# Patient Record
Sex: Female | Born: 1940 | Race: Black or African American | Hispanic: No | Marital: Single | Smoking: Former smoker
Health system: Southern US, Community
[De-identification: ages and names within clinical notes are randomized; demographics above are authoritative.]

## PROBLEM LIST (undated history)

## (undated) DIAGNOSIS — I209 Angina pectoris, unspecified: Secondary | ICD-10-CM

## (undated) DIAGNOSIS — Z923 Personal history of irradiation: Secondary | ICD-10-CM

## (undated) DIAGNOSIS — M2540 Effusion, unspecified joint: Secondary | ICD-10-CM

## (undated) DIAGNOSIS — E785 Hyperlipidemia, unspecified: Secondary | ICD-10-CM

## (undated) DIAGNOSIS — M1990 Unspecified osteoarthritis, unspecified site: Secondary | ICD-10-CM

## (undated) DIAGNOSIS — I10 Essential (primary) hypertension: Secondary | ICD-10-CM

## (undated) DIAGNOSIS — G4700 Insomnia, unspecified: Secondary | ICD-10-CM

## (undated) DIAGNOSIS — Z9889 Other specified postprocedural states: Secondary | ICD-10-CM

## (undated) DIAGNOSIS — J189 Pneumonia, unspecified organism: Secondary | ICD-10-CM

## (undated) DIAGNOSIS — C50919 Malignant neoplasm of unspecified site of unspecified female breast: Secondary | ICD-10-CM

## (undated) DIAGNOSIS — H269 Unspecified cataract: Secondary | ICD-10-CM

## (undated) DIAGNOSIS — R350 Frequency of micturition: Secondary | ICD-10-CM

## (undated) DIAGNOSIS — R112 Nausea with vomiting, unspecified: Secondary | ICD-10-CM

## (undated) DIAGNOSIS — M2550 Pain in unspecified joint: Secondary | ICD-10-CM

## (undated) HISTORY — PX: COLONOSCOPY: SHX174

## (undated) HISTORY — DX: Malignant neoplasm of unspecified site of unspecified female breast: C50.919

## (undated) HISTORY — PX: OTHER SURGICAL HISTORY: SHX169

## (undated) HISTORY — PX: CHOLECYSTECTOMY: SHX55

## (undated) HISTORY — PX: MYOMECTOMY: SHX85

## (undated) HISTORY — PX: TONSILLECTOMY: SUR1361

---

## 1978-01-10 HISTORY — PX: ABDOMINAL HYSTERECTOMY: SHX81

## 1996-01-11 HISTORY — PX: OTHER SURGICAL HISTORY: SHX169

## 1997-07-29 ENCOUNTER — Ambulatory Visit (HOSPITAL_COMMUNITY): Admission: RE | Admit: 1997-07-29 | Discharge: 1997-07-29 | Payer: Self-pay | Admitting: Internal Medicine

## 1997-10-07 ENCOUNTER — Ambulatory Visit (HOSPITAL_COMMUNITY): Admission: RE | Admit: 1997-10-07 | Discharge: 1997-10-07 | Payer: Self-pay | Admitting: Internal Medicine

## 1998-01-20 ENCOUNTER — Ambulatory Visit (HOSPITAL_COMMUNITY): Admission: RE | Admit: 1998-01-20 | Discharge: 1998-01-20 | Payer: Self-pay | Admitting: Internal Medicine

## 1998-08-04 ENCOUNTER — Ambulatory Visit (HOSPITAL_COMMUNITY): Admission: RE | Admit: 1998-08-04 | Discharge: 1998-08-04 | Payer: Self-pay | Admitting: Internal Medicine

## 1999-08-06 ENCOUNTER — Ambulatory Visit (HOSPITAL_COMMUNITY): Admission: RE | Admit: 1999-08-06 | Discharge: 1999-08-06 | Payer: Self-pay | Admitting: Internal Medicine

## 1999-11-12 ENCOUNTER — Ambulatory Visit (HOSPITAL_COMMUNITY): Admission: RE | Admit: 1999-11-12 | Discharge: 1999-11-12 | Payer: Self-pay | Admitting: Orthopedic Surgery

## 2000-08-07 ENCOUNTER — Ambulatory Visit (HOSPITAL_COMMUNITY): Admission: RE | Admit: 2000-08-07 | Discharge: 2000-08-07 | Payer: Self-pay | Admitting: Internal Medicine

## 2001-08-01 ENCOUNTER — Ambulatory Visit (HOSPITAL_COMMUNITY): Admission: RE | Admit: 2001-08-01 | Discharge: 2001-08-01 | Payer: Self-pay | Admitting: Internal Medicine

## 2002-08-05 ENCOUNTER — Ambulatory Visit (HOSPITAL_COMMUNITY): Admission: RE | Admit: 2002-08-05 | Discharge: 2002-08-05 | Payer: Self-pay | Admitting: *Deleted

## 2002-08-05 ENCOUNTER — Encounter: Payer: Self-pay | Admitting: Internal Medicine

## 2003-08-06 ENCOUNTER — Ambulatory Visit (HOSPITAL_COMMUNITY): Admission: RE | Admit: 2003-08-06 | Discharge: 2003-08-06 | Payer: Self-pay | Admitting: Internal Medicine

## 2004-11-09 ENCOUNTER — Encounter: Admission: RE | Admit: 2004-11-09 | Discharge: 2004-11-09 | Payer: Self-pay | Admitting: Internal Medicine

## 2006-08-17 ENCOUNTER — Ambulatory Visit (HOSPITAL_COMMUNITY): Admission: RE | Admit: 2006-08-17 | Discharge: 2006-08-17 | Payer: Self-pay | Admitting: Internal Medicine

## 2007-07-11 ENCOUNTER — Other Ambulatory Visit: Admission: RE | Admit: 2007-07-11 | Discharge: 2007-07-11 | Payer: Self-pay | Admitting: Obstetrics & Gynecology

## 2007-09-19 ENCOUNTER — Ambulatory Visit (HOSPITAL_COMMUNITY): Admission: RE | Admit: 2007-09-19 | Discharge: 2007-09-19 | Payer: Self-pay | Admitting: Internal Medicine

## 2008-09-24 ENCOUNTER — Ambulatory Visit (HOSPITAL_COMMUNITY): Admission: RE | Admit: 2008-09-24 | Discharge: 2008-09-24 | Payer: Self-pay | Admitting: Internal Medicine

## 2009-09-25 ENCOUNTER — Ambulatory Visit (HOSPITAL_COMMUNITY): Admission: RE | Admit: 2009-09-25 | Discharge: 2009-09-25 | Payer: Self-pay | Admitting: Internal Medicine

## 2010-01-31 ENCOUNTER — Encounter: Payer: Self-pay | Admitting: Orthopedic Surgery

## 2010-09-15 ENCOUNTER — Other Ambulatory Visit: Payer: Self-pay | Admitting: Internal Medicine

## 2010-09-15 DIAGNOSIS — Z1231 Encounter for screening mammogram for malignant neoplasm of breast: Secondary | ICD-10-CM

## 2010-10-06 ENCOUNTER — Ambulatory Visit (HOSPITAL_COMMUNITY)
Admission: RE | Admit: 2010-10-06 | Discharge: 2010-10-06 | Disposition: A | Discharge status: Home / Self Care | Payer: Medicare Other | Source: Ambulatory Visit | Attending: Internal Medicine | Admitting: Internal Medicine

## 2010-10-06 DIAGNOSIS — Z1231 Encounter for screening mammogram for malignant neoplasm of breast: Secondary | ICD-10-CM | POA: Insufficient documentation

## 2010-10-12 ENCOUNTER — Other Ambulatory Visit: Payer: Self-pay | Admitting: Internal Medicine

## 2010-10-12 DIAGNOSIS — R928 Other abnormal and inconclusive findings on diagnostic imaging of breast: Secondary | ICD-10-CM

## 2010-10-25 ENCOUNTER — Ambulatory Visit
Admission: RE | Admit: 2010-10-25 | Discharge: 2010-10-25 | Disposition: A | Discharge status: Home / Self Care | Payer: Medicare Other | Source: Ambulatory Visit | Attending: Internal Medicine | Admitting: Internal Medicine

## 2010-10-25 ENCOUNTER — Ambulatory Visit
Admission: RE | Admit: 2010-10-25 | Discharge: 2010-10-25 | Disposition: A | Discharge status: Home / Self Care | Payer: PRIVATE HEALTH INSURANCE | Source: Ambulatory Visit | Attending: Internal Medicine | Admitting: Internal Medicine

## 2010-10-25 ENCOUNTER — Other Ambulatory Visit: Payer: Self-pay | Admitting: Internal Medicine

## 2010-10-25 DIAGNOSIS — R928 Other abnormal and inconclusive findings on diagnostic imaging of breast: Secondary | ICD-10-CM

## 2010-11-09 ENCOUNTER — Emergency Department (HOSPITAL_COMMUNITY)
Admission: EM | Admit: 2010-11-09 | Discharge: 2010-11-09 | Disposition: A | Discharge status: Home / Self Care | Payer: Medicare Other | Attending: Emergency Medicine | Admitting: Emergency Medicine

## 2010-11-09 ENCOUNTER — Emergency Department (HOSPITAL_COMMUNITY): Payer: Medicare Other

## 2010-11-09 DIAGNOSIS — R079 Chest pain, unspecified: Secondary | ICD-10-CM | POA: Insufficient documentation

## 2010-11-09 DIAGNOSIS — I10 Essential (primary) hypertension: Secondary | ICD-10-CM | POA: Insufficient documentation

## 2010-11-09 LAB — CBC
HCT: 37.9 % (ref 36.0–46.0)
Hemoglobin: 12.9 g/dL (ref 12.0–15.0)
MCH: 32 pg (ref 26.0–34.0)
MCV: 94 fL (ref 78.0–100.0)
RBC: 4.03 MIL/uL (ref 3.87–5.11)
WBC: 3.8 10*3/uL — ABNORMAL LOW (ref 4.0–10.5)

## 2010-11-09 LAB — COMPREHENSIVE METABOLIC PANEL
ALT: 9 U/L (ref 0–35)
Alkaline Phosphatase: 72 U/L (ref 39–117)
CO2: 26 mEq/L (ref 19–32)
Chloride: 100 mEq/L (ref 96–112)
GFR calc Af Amer: >90 mL/min (ref >90)
GFR calc non Af Amer: >90 mL/min (ref >90)
Glucose, Bld: 83 mg/dL (ref 70–99)
Potassium: 4.6 mEq/L (ref 3.5–5.1)
Sodium: 137 mEq/L (ref 135–145)
Total Bilirubin: 1 mg/dL (ref 0.3–1.2)
Total Protein: 7.6 g/dL (ref 6.0–8.3)

## 2010-11-19 ENCOUNTER — Other Ambulatory Visit (HOSPITAL_COMMUNITY): Payer: Self-pay | Admitting: Cardiology

## 2010-12-01 ENCOUNTER — Ambulatory Visit (HOSPITAL_COMMUNITY): Admission: RE | Admit: 2010-12-01 | Payer: BC Managed Care – PPO | Source: Ambulatory Visit

## 2010-12-01 ENCOUNTER — Encounter (HOSPITAL_COMMUNITY)
Admission: RE | Admit: 2010-12-01 | Discharge: 2010-12-01 | Disposition: A | Discharge status: Home / Self Care | Payer: Medicare Other | Source: Ambulatory Visit | Attending: Cardiology | Admitting: Cardiology

## 2010-12-01 DIAGNOSIS — R079 Chest pain, unspecified: Secondary | ICD-10-CM | POA: Insufficient documentation

## 2010-12-01 MED ORDER — TECHNETIUM TC 99M TETROFOSMIN IV KIT
30.0000 | PACK | Freq: Once | INTRAVENOUS | Status: AC | PRN
  Administered 2010-12-01: 30 via INTRAVENOUS

## 2010-12-01 MED ORDER — REGADENOSON 0.4 MG/5ML IV SOLN
INTRAVENOUS | Status: AC
  Administered 2010-12-01: 0.4 mg
  Filled 2010-12-01: qty 5

## 2010-12-01 MED ORDER — SINCALIDE 5 MCG IJ SOLR: 0.0200 ug/kg | Freq: Once | INTRAMUSCULAR | Status: DC

## 2010-12-01 MED ORDER — TECHNETIUM TC 99M TETROFOSMIN IV KIT
10.0000 | PACK | Freq: Once | INTRAVENOUS | Status: AC | PRN
  Administered 2010-12-01: 10 via INTRAVENOUS

## 2010-12-16 ENCOUNTER — Inpatient Hospital Stay (HOSPITAL_BASED_OUTPATIENT_CLINIC_OR_DEPARTMENT_OTHER)
Admission: RE | Admit: 2010-12-16 | Discharge: 2010-12-16 | Disposition: A | Discharge status: Home / Self Care | Payer: Medicare Other | Source: Ambulatory Visit | Attending: Cardiology | Admitting: Cardiology

## 2010-12-16 ENCOUNTER — Encounter (HOSPITAL_BASED_OUTPATIENT_CLINIC_OR_DEPARTMENT_OTHER)
Admission: RE | Disposition: A | Discharge status: Home / Self Care | Payer: Self-pay | Source: Ambulatory Visit | Attending: Cardiology

## 2010-12-16 ENCOUNTER — Encounter (HOSPITAL_BASED_OUTPATIENT_CLINIC_OR_DEPARTMENT_OTHER): Payer: Self-pay | Admitting: *Deleted

## 2010-12-16 DIAGNOSIS — I2510 Atherosclerotic heart disease of native coronary artery without angina pectoris: Secondary | ICD-10-CM | POA: Insufficient documentation

## 2010-12-16 DIAGNOSIS — I10 Essential (primary) hypertension: Secondary | ICD-10-CM | POA: Insufficient documentation

## 2010-12-16 DIAGNOSIS — R079 Chest pain, unspecified: Secondary | ICD-10-CM | POA: Insufficient documentation

## 2010-12-16 HISTORY — DX: Essential (primary) hypertension: I10

## 2010-12-16 HISTORY — PX: CARDIAC CATHETERIZATION: SHX172

## 2010-12-16 HISTORY — DX: Angina pectoris, unspecified: I20.9

## 2010-12-16 SURGERY — JV LEFT HEART CATHETERIZATION WITH CORONARY ANGIOGRAM
Anesthesia: Moderate Sedation

## 2010-12-16 MED ORDER — DIAZEPAM 5 MG PO TABS
5.0000 mg | ORAL_TABLET | Freq: Once | ORAL | Status: AC
  Administered 2010-12-16: 5 mg via ORAL

## 2010-12-16 MED ORDER — SODIUM CHLORIDE 0.9 % IV SOLN: INTRAVENOUS | Status: DC

## 2010-12-16 MED ORDER — ONDANSETRON HCL 4 MG/2ML IJ SOLN: 4.0000 mg | Freq: Four times a day (QID) | INTRAMUSCULAR | Status: DC | PRN

## 2010-12-16 MED ORDER — ACETAMINOPHEN 325 MG PO TABS: 650.0000 mg | ORAL_TABLET | ORAL | Status: DC | PRN

## 2010-12-16 MED ORDER — DIAZEPAM 5 MG PO TABS: 5.0000 mg | ORAL_TABLET | Freq: Four times a day (QID) | ORAL | Status: DC | PRN

## 2010-12-16 MED ORDER — SODIUM CHLORIDE 0.9 % IV SOLN
INTRAVENOUS | Status: DC
  Administered 2010-12-16: 07:00:00 via INTRAVENOUS

## 2010-12-16 NOTE — Cardiovascular Report (Signed)
NAME:  Kara Diaz, Kara Diaz             ACCOUNT NO.:  [id number]  MEDICAL RECORD NO.:  [id number]  LOCATION:                                 FACILITY:  PHYSICIAN:  Lachina Salsberry N. Sharyn Lull, M.D. DATE OF BIRTH:  02-23-1940  DATE OF PROCEDURE:  12/16/2010 DATE OF DISCHARGE:                           CARDIAC CATHETERIZATION   PROCEDURE:  Left cardiac catheterization with selective left and right coronary angiography, left ventriculography via right groin using Judkins technique.  INDICATION FOR THE PROCEDURE:  Kara Diaz is a 70 year old black female with past medical history significant for hypertension, hypercholesteremia, morbid obesity, complains of left-sided chest pain described as burning, radiating to left shoulder and back, relieves with rest in few minutes.  The patient was seen in ER last month, workup was negative, and was referred to our office for further evaluation.  The patient denies any nausea, vomiting, diaphoresis.  Denies palpitation, lightheadedness, or syncope.  Denies PND, orthopnea, or leg swelling. The patient underwent Lexiscan Myoview on December 01, 2010, which showed mild reversible ischemia involving the apical portion of the anterior and anteroseptal wall with normal EF.  PAST MEDICAL HISTORY:  As above.  PAST SURGICAL HISTORY:  She had hysterectomy in the past, had cholecystectomy in the past, had right shoulder rotator cuff surgery in the past, had fibroid tumor resection in the past.  ALLERGIES:  No known drug allergies.  MEDICATIONS:  At home, she was on: 1. Amlodipine. 2. Benazepril 10/20 daily. 3. Maxzide 1 tablet daily. 4. K-Dur 20 mEq 1 tablet daily. 5. Enteric-coated aspirin 81 mg p.o. daily. 6. Nitrostat sublingual p.r.n.  SOCIAL HISTORY:  She is single.  No children.  Smoked 2 packs per day for 5-6 years, quit in 1990.  Drinks wine occasionally.  FAMILY HISTORY:  Father died of complications of heart failure at the age of 24.  Mother died of  sudden cardiac death at the age of 42.  One brother had coronary artery disease, and stroke, also had CABG.  PHYSICAL EXAMINATION:  GENERAL:  She was alert, awake, oriented x3 in no acute distress. VITAL SIGNS:  Blood pressure was 160/90, pulse was 72 and regular. EYES:  Conjunctivae was pink. NECK:  Supple.  No JVD.  No bruit. LUNGS:  Clear to auscultation without rhonchi or rales. CARDIOVASCULAR:  S1, S2 was normal.  There was soft systolic murmur. There was no S3 gallop. ABDOMEN:  Soft.  Bowel sounds were present.  Nontender. EXTREMITIES:  There was no clubbing, cyanosis, or edema.  IMPRESSION:  New-onset angina, positive Lexiscan Myoview rule out coronary insufficiency, hypertension, hypercholesteremia, morbid obesity, positive family history of coronary artery disease, and  remote tobacco abuse.  I discussed with the patient regarding mildly abnormal Lexiscan Myoview and various options of treatment, i.e. medical versus invasive left cath, its risks and benefits, i.e., death, myocardial infarction, stroke, local vascular complications, etc. and consented for left catheterization.  PROCEDURE:  After obtaining informed consent, the patient was brought to the cath lab and was placed on fluoroscopy table.  Right groin was prepped and draped in usual fashion.  Xylocaine 1% was used for local anesthesia in the right groin.  With the help of thin wall needle,  a 4- [place] arterial sheath was placed.  The sheath was aspirated and flushed.  Next, 4-French left Judkins catheter was advanced over the wire under fluoroscopic guidance up to the ascending aorta.  Wire was pulled out, the catheter was aspirated and connected to the Manifold. Catheter was further advanced and engaged into left coronary ostium. Multiple views of the left system were taken.  Next, the catheter was disengaged and was pulled out over the wire and was replaced with 4- [place] 3-D right diagnostic catheter, which was  advanced over the wire under fluoroscopic guidance up to the ascending aorta.  Wire was pulled out, the catheter was aspirated and connected to the Manifold.  Catheter was further advanced and engaged into right coronary ostium.  Multiple views of the right system were obtained.  Next, the catheter was disengaged and was pulled out over the wire and was replaced with 4- French pigtail catheter, which was advanced over the wire under fluoroscopic guidance to the ascending aorta.  Catheter was further advanced across the aortic valve into the left ventricle.  Left ventricular pressures were recorded.  Next, left ventriculography was done in 30-degree RAO position.  Post angiographic pressures were recorded from left ventricle and then pullback pressures were recorded from the aorta.  There was no gradient across the aortic valve.  Next, the pigtail catheter was pulled out over the wire.  Sheaths were aspirated and flushed.  FINDINGS:  LV showed good LV systolic function, EF of 60-65%.  Left main was patent.  LAD has 20-25% proximal stenosis.  Diagonal 1 is very, very small.  Diagonal 2 and 3 were small, which were patent.  Ramus was very small, which was patent.  Left circumflex has 30-40% ostial stenosis. OM 1 is small.  OM 2 and OM 3 were very, very small.  OM 4 was very small.  RCA was small, the patient has left dominant coronary system. The patient tolerated the procedure well.  There were no complications. The patient was transferred to recovery room in stable condition.     Eduardo Osier. Sharyn Lull, M.D.     MNH/MEDQ  D:  12/16/2010  T:  12/16/2010  Job:  [id number]

## 2010-12-16 NOTE — Op Note (Signed)
Cardiac cath report dictated on 12/16/2010 dictation number is [phone]

## 2010-12-16 NOTE — Progress Notes (Signed)
Bedrest begins @ 0845.  Tegaderm dressing applied to right groin, mobility limitiations discussed.

## 2010-12-16 NOTE — Progress Notes (Signed)
Discharge instructions completed, ambulated to bathroom without bleeding.  IV discontinued.  Discharged to home via wheelchair.  Patient voices understanding.

## 2011-01-11 DIAGNOSIS — Z923 Personal history of irradiation: Secondary | ICD-10-CM

## 2011-01-11 HISTORY — DX: Personal history of irradiation: Z92.3

## 2011-02-08 DIAGNOSIS — I2510 Atherosclerotic heart disease of native coronary artery without angina pectoris: Secondary | ICD-10-CM | POA: Diagnosis not present

## 2011-02-08 DIAGNOSIS — I10 Essential (primary) hypertension: Secondary | ICD-10-CM | POA: Diagnosis not present

## 2011-02-08 DIAGNOSIS — E669 Obesity, unspecified: Secondary | ICD-10-CM | POA: Diagnosis not present

## 2011-02-08 DIAGNOSIS — E7800 Pure hypercholesterolemia, unspecified: Secondary | ICD-10-CM | POA: Diagnosis not present

## 2011-03-11 ENCOUNTER — Other Ambulatory Visit: Payer: Self-pay | Admitting: Internal Medicine

## 2011-03-11 DIAGNOSIS — R921 Mammographic calcification found on diagnostic imaging of breast: Secondary | ICD-10-CM

## 2011-03-11 DIAGNOSIS — C50919 Malignant neoplasm of unspecified site of unspecified female breast: Secondary | ICD-10-CM

## 2011-03-11 HISTORY — DX: Malignant neoplasm of unspecified site of unspecified female breast: C50.919

## 2011-03-25 ENCOUNTER — Ambulatory Visit
Admission: RE | Admit: 2011-03-25 | Discharge: 2011-03-25 | Disposition: A | Discharge status: Home / Self Care | Payer: Medicare Other | Source: Ambulatory Visit | Attending: Internal Medicine | Admitting: Internal Medicine

## 2011-03-25 DIAGNOSIS — R921 Mammographic calcification found on diagnostic imaging of breast: Secondary | ICD-10-CM

## 2011-03-25 DIAGNOSIS — R928 Other abnormal and inconclusive findings on diagnostic imaging of breast: Secondary | ICD-10-CM | POA: Diagnosis not present

## 2011-03-25 DIAGNOSIS — D0590 Unspecified type of carcinoma in situ of unspecified breast: Secondary | ICD-10-CM | POA: Diagnosis not present

## 2011-03-25 DIAGNOSIS — R920 Mammographic microcalcification found on diagnostic imaging of breast: Secondary | ICD-10-CM | POA: Diagnosis not present

## 2011-03-25 HISTORY — PX: BREAST BIOPSY: SHX20

## 2011-03-28 ENCOUNTER — Other Ambulatory Visit: Payer: Self-pay | Admitting: Internal Medicine

## 2011-03-28 DIAGNOSIS — N6489 Other specified disorders of breast: Secondary | ICD-10-CM

## 2011-03-28 DIAGNOSIS — C50912 Malignant neoplasm of unspecified site of left female breast: Secondary | ICD-10-CM

## 2011-04-01 ENCOUNTER — Telehealth: Payer: Self-pay | Admitting: *Deleted

## 2011-04-01 ENCOUNTER — Ambulatory Visit
Admission: RE | Admit: 2011-04-01 | Discharge: 2011-04-01 | Disposition: A | Discharge status: Home / Self Care | Payer: Medicare Other | Source: Ambulatory Visit | Attending: Internal Medicine | Admitting: Internal Medicine

## 2011-04-01 ENCOUNTER — Encounter: Payer: Self-pay | Admitting: *Deleted

## 2011-04-01 ENCOUNTER — Other Ambulatory Visit: Payer: Self-pay | Admitting: Diagnostic Radiology

## 2011-04-01 ENCOUNTER — Other Ambulatory Visit: Payer: Self-pay | Admitting: *Deleted

## 2011-04-01 ENCOUNTER — Other Ambulatory Visit: Payer: Self-pay | Admitting: Internal Medicine

## 2011-04-01 DIAGNOSIS — I2510 Atherosclerotic heart disease of native coronary artery without angina pectoris: Secondary | ICD-10-CM | POA: Diagnosis not present

## 2011-04-01 DIAGNOSIS — C50919 Malignant neoplasm of unspecified site of unspecified female breast: Secondary | ICD-10-CM | POA: Diagnosis not present

## 2011-04-01 DIAGNOSIS — D0512 Intraductal carcinoma in situ of left breast: Secondary | ICD-10-CM | POA: Insufficient documentation

## 2011-04-01 DIAGNOSIS — C50912 Malignant neoplasm of unspecified site of left female breast: Secondary | ICD-10-CM

## 2011-04-01 DIAGNOSIS — N6489 Other specified disorders of breast: Secondary | ICD-10-CM

## 2011-04-01 DIAGNOSIS — I10 Essential (primary) hypertension: Secondary | ICD-10-CM | POA: Diagnosis not present

## 2011-04-01 DIAGNOSIS — E7800 Pure hypercholesterolemia, unspecified: Secondary | ICD-10-CM | POA: Diagnosis not present

## 2011-04-01 DIAGNOSIS — D0510 Intraductal carcinoma in situ of unspecified breast: Secondary | ICD-10-CM

## 2011-04-01 NOTE — Telephone Encounter (Signed)
Confirmed [facility] for 04/06/11 at 0800 .  Instructions and contact information given.  

## 2011-04-01 NOTE — Telephone Encounter (Signed)
per nurse victoria requested the patient be put on flush schedule at 9:00am to get an IV started before the patient goes to the GI appointment on 04-06-2011

## 2011-04-01 NOTE — Progress Notes (Signed)
rec request from [facility] for an IV access for pt prior to imaging study. Per Myriam Jacobson, pt is to arrive to "flush room" at 830 for IV access. Pt has been left a VM notifying date and time. Scheduling has also been notified

## 2011-04-01 NOTE — Telephone Encounter (Signed)
Left vm for pt to return call concerning [facility] appointment for 04/06/11.

## 2011-04-04 ENCOUNTER — Ambulatory Visit (HOSPITAL_BASED_OUTPATIENT_CLINIC_OR_DEPARTMENT_OTHER): Payer: Medicare Other | Admitting: Lab

## 2011-04-04 ENCOUNTER — Ambulatory Visit
Admission: RE | Admit: 2011-04-04 | Discharge: 2011-04-04 | Disposition: A | Discharge status: Home / Self Care | Payer: Medicare Other | Source: Ambulatory Visit | Attending: Internal Medicine | Admitting: Internal Medicine

## 2011-04-04 DIAGNOSIS — D0510 Intraductal carcinoma in situ of unspecified breast: Secondary | ICD-10-CM

## 2011-04-04 DIAGNOSIS — D0590 Unspecified type of carcinoma in situ of unspecified breast: Secondary | ICD-10-CM | POA: Diagnosis not present

## 2011-04-04 DIAGNOSIS — C50912 Malignant neoplasm of unspecified site of left female breast: Secondary | ICD-10-CM

## 2011-04-04 DIAGNOSIS — C50919 Malignant neoplasm of unspecified site of unspecified female breast: Secondary | ICD-10-CM | POA: Diagnosis not present

## 2011-04-04 LAB — COMPREHENSIVE METABOLIC PANEL
AST: 17 U/L (ref 0–37)
Albumin: 3.8 g/dL (ref 3.5–5.2)
BUN: 17 mg/dL (ref 6–23)
CO2: 36 mEq/L — ABNORMAL HIGH (ref 19–32)
Calcium: 9.9 mg/dL (ref 8.4–10.5)
Chloride: 100 mEq/L (ref 96–112)
Creatinine, Ser: 0.51 mg/dL (ref 0.50–1.10)
Glucose, Bld: 86 mg/dL (ref 70–99)
Potassium: 3.3 mEq/L — ABNORMAL LOW (ref 3.5–5.3)

## 2011-04-04 LAB — CBC WITH DIFFERENTIAL/PLATELET
Basophils Absolute: 0 10*3/uL (ref 0.0–0.1)
EOS%: 1.7 % (ref 0.0–7.0)
Eosinophils Absolute: 0.1 10*3/uL (ref 0.0–0.5)
HCT: 38.8 % (ref 34.8–46.6)
HGB: 13.2 g/dL (ref 11.6–15.9)
MCH: 32.5 pg (ref 25.1–34.0)
NEUT#: 2 10*3/uL (ref 1.5–6.5)
NEUT%: 51.9 % (ref 38.4–76.8)
lymph#: 1.5 10*3/uL (ref 0.9–3.3)

## 2011-04-04 MED ORDER — GADOBENATE DIMEGLUMINE 529 MG/ML IV SOLN: 18.0000 mL | Freq: Once | INTRAVENOUS | Status: AC | PRN

## 2011-04-06 ENCOUNTER — Ambulatory Visit
Admission: RE | Admit: 2011-04-06 | Discharge: 2011-04-06 | Disposition: A | Discharge status: Home / Self Care | Payer: Medicare Other | Source: Ambulatory Visit | Attending: Radiation Oncology | Admitting: Radiation Oncology

## 2011-04-06 ENCOUNTER — Ambulatory Visit: Payer: Medicare Other

## 2011-04-06 ENCOUNTER — Ambulatory Visit (HOSPITAL_BASED_OUTPATIENT_CLINIC_OR_DEPARTMENT_OTHER): Payer: Medicare Other | Admitting: Oncology

## 2011-04-06 ENCOUNTER — Ambulatory Visit: Payer: Medicare Other | Attending: Surgery | Admitting: Physical Therapy

## 2011-04-06 ENCOUNTER — Encounter: Payer: Self-pay | Admitting: Oncology

## 2011-04-06 ENCOUNTER — Telehealth: Payer: Self-pay | Admitting: *Deleted

## 2011-04-06 ENCOUNTER — Other Ambulatory Visit (INDEPENDENT_AMBULATORY_CARE_PROVIDER_SITE_OTHER): Payer: Self-pay | Admitting: Surgery

## 2011-04-06 ENCOUNTER — Ambulatory Visit (HOSPITAL_BASED_OUTPATIENT_CLINIC_OR_DEPARTMENT_OTHER): Payer: Medicare Other | Admitting: Surgery

## 2011-04-06 ENCOUNTER — Other Ambulatory Visit: Payer: Medicare Other | Admitting: Lab

## 2011-04-06 ENCOUNTER — Encounter (INDEPENDENT_AMBULATORY_CARE_PROVIDER_SITE_OTHER): Payer: Self-pay | Admitting: Surgery

## 2011-04-06 VITALS — BP 169/93 | HR 73 | Temp 98.0°F | Ht 64.0 in | Wt 202.8 lb

## 2011-04-06 DIAGNOSIS — D0590 Unspecified type of carcinoma in situ of unspecified breast: Secondary | ICD-10-CM

## 2011-04-06 DIAGNOSIS — I10 Essential (primary) hypertension: Secondary | ICD-10-CM

## 2011-04-06 DIAGNOSIS — D0510 Intraductal carcinoma in situ of unspecified breast: Secondary | ICD-10-CM

## 2011-04-06 DIAGNOSIS — M1990 Unspecified osteoarthritis, unspecified site: Secondary | ICD-10-CM

## 2011-04-06 DIAGNOSIS — IMO0001 Reserved for inherently not codable concepts without codable children: Secondary | ICD-10-CM | POA: Insufficient documentation

## 2011-04-06 DIAGNOSIS — C50919 Malignant neoplasm of unspecified site of unspecified female breast: Secondary | ICD-10-CM | POA: Diagnosis not present

## 2011-04-06 DIAGNOSIS — M25569 Pain in unspecified knee: Secondary | ICD-10-CM | POA: Diagnosis not present

## 2011-04-06 DIAGNOSIS — R293 Abnormal posture: Secondary | ICD-10-CM | POA: Insufficient documentation

## 2011-04-06 DIAGNOSIS — M25519 Pain in unspecified shoulder: Secondary | ICD-10-CM | POA: Insufficient documentation

## 2011-04-06 NOTE — Patient Instructions (Signed)
1. Surgery  2. Candidate for B-43 trial  3. Radiation after surgery  4. Anti-estrogen   5. Follow up in 1 month

## 2011-04-06 NOTE — Progress Notes (Signed)
Kara Diaz [id number] Dec 09, 1940 71 y.o. 04/06/2011 12:02 PM  CC  August Saucer, ERIC, MD, MD [address] [facility] [place] [address] Dr. Harriette Bouillon Dr. Lurline Hare  REASON FOR CONSULTATION:  New diagnosis of high-grade ductal carcinoma in situ of the left breast, clinical stage 0 being seen for discussion of treatment options.  STAGE:   DCIS (ductal carcinoma in situ) of breast   Primary site: Breast (Left)   Staging method: AJCC 7th Edition   Clinical: Stage 0 (Tis, N0, cM0)   Summary: Stage 0 (Tis, N0, cM0)  REFERRING PHYSICIAN: Dr. Maisie Fus Cornett  HISTORY OF PRESENT ILLNESS:  Kara Diaz is a 71 y.o. female With medical history significant for hypertension angina and some osteoarthritis. Patient began having screening mammograms at a young age around 67. Most recently she presented back in September for a screening mammogram and she was found to have 2.5 cm calcifications in the left breast. She thereafter most recently in [place] she presented for a biopsy of these calcifications. She had a image guided biopsy performed that showed an intermediate grade to high grade ductal carcinoma in situ. Tumor was question receptor 8% progesterone receptor 7%. She had an MRI of the breasts performed and the MRI showed a 1.2 cm area of hematoma. There were no findings in the contralateral breast. She is now seen in the multidisciplinary breast clinic for discussion of her treatment options. She is without any complaints except for her arthritis.   Past Medical History: Past Medical History  Diagnosis Date  . Hypertension   . Angina     Past Surgical History: Past Surgical History  Procedure Date  . Cholecystectomy   . Abdominal hysterectomy   . Rotator cuff surgery     Family History: History reviewed. No pertinent family history.  Social History History  Substance Use Topics  . Smoking status: Never Smoker   . Smokeless tobacco:  Never Used  . Alcohol Use: Yes    Allergies: No Known Allergies  Current Medications: Current Outpatient Prescriptions  Medication Sig Dispense Refill  . amLODipine-benazepril (LOTREL) 10-20 MG per capsule Take 1 capsule by mouth daily.      . nitroGLYCERIN (NITROSTAT) 0.4 MG SL tablet Place 0.4 mg under the tongue every 5 (five) minutes as needed. Take as directed      . potassium chloride (K-DUR) 10 MEQ tablet Take 20 mEq by mouth 2 (two) times daily.      Marland Kitchen triamterene-hydrochlorothiazide (DYAZIDE) 37.5-25 MG per capsule Take 1 capsule by mouth every morning.        OB/GYN History:Menarche at age 36 underwent menopause in 76 she has never been on hormone replacement therapy she is nulliparous. She has had her colonoscopy. She began her first screening mammogram in 1993 approximately.  Fertility Discussion:Not applicable Prior History of Cancer:No prior history of cancer  Health Maintenance: Colonoscopy has been performed there is no documentation of the bone density or Pap smear   ECOG PERFORMANCE STATUS: 1 - Symptomatic but completely ambulatory  Genetic Counseling/testing:Full family history was reviewed with the patient there is no family history of ovarian cancers or early-onset breast cancers or bilateral breast cancers.  REVIEW OF SYSTEMS:  Constitutional: negative Ears, nose, mouth, throat, and face: negative Respiratory: negative Cardiovascular: positive for palpitations Gastrointestinal: negative Genitourinary:negative Integument/breast: positive for breast tenderness Musculoskeletal:positive for stiff joints Neurological: negative  PHYSICAL EXAMINATION: Blood pressure 169/93, pulse 73, temperature 98 F (36.7 C), height 5\' 4"  (1.626 m),  weight 202 lb 12.8 oz (91.989 kg).  ZOX:WRUEA, healthy and no distress SKIN: skin color, texture, turgor are normal HEAD: No masses, lesions, tenderness or abnormalities EYES: PERRLA, EOMI, Conjunctiva are pink and  non-injected EARS: External ears normal OROPHARYNX:no exudate, no erythema and lips, buccal mucosa, and tongue normal  NECK: supple, no adenopathy, thyroid normal size, non-tender, without nodularity LYMPH:  no palpable lymphadenopathy BREAST:Left breast reveals area of ecchymosis at the biopsy site. No other masses are noted right breast no masses or nipple discharge or changes. LUNGS: clear to auscultation and percussion HEART: regular rate & rhythm ABDOMEN:abdomen soft, non-tender, obese, normal bowel sounds and no masses or organomegaly BACK: No CVA tenderness EXTREMITIES:no joint deformities, effusion, or inflammation  NEURO: alert & oriented x 3 with fluent speech, no focal motor/sensory deficits, gait normal, reflexes normal and symmetric   STUDIES/RESULTS: Mr Breast Bilateral W Wo Contrast  04/04/2011  *RADIOLOGY REPORT*  Clinical Data: New diagnosis left sided breast cancer.  BILATERAL BREAST MRI WITH AND WITHOUT CONTRAST  Technique: Multiplanar, multisequence MR images of both breasts were obtained prior to and following the intravenous administration of 18ml of Multihance.  Three dimensional images were evaluated at the independent DynaCad workstation.  Comparison:  Mammogram dated 04/01/2011  Findings: Postbiopsy changes are seen in the upper-outer quadrant of the left breast, middle third with a 1.2 cm hematoma. Peripheral enhancement seen at the biopsy cavity without additional clumped and linear enhancement.  A biopsy clip is in place.  No other suspicious mass or enhancement seen in either breast.  No axillary or internal mammary adenopathy is seen.  Benign cysts or hemangiomas are seen in the liver.  IMPRESSION: Postbiopsy changes at the site of known malignancy, left breast without significant residual enhancement.  No MRI specific evidence of malignancy, right breast.  THREE-DIMENSIONAL MR IMAGE RENDERING ON INDEPENDENT WORKSTATION:  Three-dimensional MR images were rendered by  post-processing of the original MR data on an independent workstation.  The three- dimensional MR images were interpreted, and findings were reported in the accompanying complete MRI report for this study.  BI-RADS CATEGORY 6:  Known biopsy-proven malignancy - appropriate action should be taken.  Original Report Authenticated By: Hiram Gash, M.D.   Mm Breast Stereo Biopsy Left  04/05/2011  **ADDENDUM** CREATED: 04/05/2011 13:54:42  The area of abnormal calcification in the left breast is felt to measure approximately 1.4 x 2.0 x 2.5 cm.  Some of calcifications included in the original measurement have been stable for several years and are felt to be benign in etiology.  Addended by:  Cain Saupe, M.D. on 04/05/2011 13:54:42.  **END ADDENDUM** SIGNED BY: Cain Saupe, M.D.    03/28/2011  **ADDENDUM** CREATED: 03/28/2011 13:29:54  Biopsy results revealed DCIS with calcifications and necrosis. This is concordant with imaging findings.  This is discussed with the patient by telephone at 12:05 p.m. on 03/28/2010. She states the site is healing well.  A breast MRI is scheduled for 04/01/2011 at 8:30 a.m.  She will be seen at [facility] on 04/06/2011.  She is instructed to call the Breast Center with further questions or concerns.  Addended by:  Hiram Gash, M.D. on 03/28/2011 13:29:54.  **END ADDENDUM** SIGNED BY: Hiram Gash, M.D.    03/25/2011  *RADIOLOGY REPORT*  Clinical Data:  Patient presents for stereotactic needle biopsy of a group of indeterminate microcalcifications of the upper outer quadrant of the left breast.  STEREOTACTIC-GUIDED VACUUM ASSISTED BIOPSY OF THE LEFT BREAST AND  SPECIMEN RADIOGRAPH  I met with the patient and we discussed the procedure of stereotactic-guided biopsy, including benefits and alternatives. We discussed the high likelihood of a successful procedure. We discussed the risks of the procedure, including infection, bleeding, tissue injury,  clip migration, and inadequate sampling. Informed, written consent was given.  Using sterile technique, 2% lidocaine, stereotactic guidance, and a 9 gauge vacuum assisted device, biopsy was performed of the targeted microcalcifications of the upper outer left breast. Specimen radiograph was performed, showing multiple of the targeted microcalcifications.  Specimens with calcifications are identified for pathology.  At the conclusion of the procedure, a tissue marker clip was deployed into the biopsy cavity.  Follow-up 2-view mammogram confirmed T-shaped clip placement along the anterior margin of the biopsy site.  IMPRESSION: Stereotactic-guided biopsy of indeterminate microcalcifications upper outer quadrant left breast.  No apparent complications.  Original Report Authenticated By: Daryl Eastern, M.D.   Mm Breast Surgical Specimen  04/05/2011  **ADDENDUM** CREATED: 04/05/2011 13:54:42  The area of abnormal calcification in the left breast is felt to measure approximately 1.4 x 2.0 x 2.5 cm.  Some of calcifications included in the original measurement have been stable for several years and are felt to be benign in etiology.  Addended by:  Cain Saupe, M.D. on 04/05/2011 13:54:42.  **END ADDENDUM** SIGNED BY: Cain Saupe, M.D.    03/28/2011  **ADDENDUM** CREATED: 03/28/2011 13:29:54  Biopsy results revealed DCIS with calcifications and necrosis. This is concordant with imaging findings.  This is discussed with the patient by telephone at 12:05 p.m. on 03/28/2010. She states the site is healing well.  A breast MRI is scheduled for 04/01/2011 at 8:30 a.m.  She will be seen at [facility] on 04/06/2011.  She is instructed to call the Breast Center with further questions or concerns.  Addended by:  Hiram Gash, M.D. on 03/28/2011 13:29:54.  **END ADDENDUM** SIGNED BY: Hiram Gash, M.D.    03/25/2011  *RADIOLOGY REPORT*  Clinical Data:  Patient presents for stereotactic needle  biopsy of a group of indeterminate microcalcifications of the upper outer quadrant of the left breast.  STEREOTACTIC-GUIDED VACUUM ASSISTED BIOPSY OF THE LEFT BREAST AND SPECIMEN RADIOGRAPH  I met with the patient and we discussed the procedure of stereotactic-guided biopsy, including benefits and alternatives. We discussed the high likelihood of a successful procedure. We discussed the risks of the procedure, including infection, bleeding, tissue injury, clip migration, and inadequate sampling. Informed, written consent was given.  Using sterile technique, 2% lidocaine, stereotactic guidance, and a 9 gauge vacuum assisted device, biopsy was performed of the targeted microcalcifications of the upper outer left breast. Specimen radiograph was performed, showing multiple of the targeted microcalcifications.  Specimens with calcifications are identified for pathology.  At the conclusion of the procedure, a tissue marker clip was deployed into the biopsy cavity.  Follow-up 2-view mammogram confirmed T-shaped clip placement along the anterior margin of the biopsy site.  IMPRESSION: Stereotactic-guided biopsy of indeterminate microcalcifications upper outer quadrant left breast.  No apparent complications.  Original Report Authenticated By: Daryl Eastern, M.D.   Mm Digital Diagnostic Unilat R  04/01/2011  *RADIOLOGY REPORT*  Clinical Data:  New diagnosis, left sided breast cancer. Evaluation before MRI.  DIGITAL DIAGNOSTIC RIGHT MAMMOGRAM WITH CAD  Comparison:  Multiple priors  Findings:  There is a fibrofatty pattern.  No mass or malignant type calcifications are seen.  Compared to priors. Mammographic images were processed with CAD.  IMPRESSION: No mammographic evidence  of malignancy, right breast.  The patient is scheduled for a breast MRI on Monday, March 25.  BI-RADS CATEGORY 1:  Negative.  Original Report Authenticated By: Hiram Gash, M.D.     LABS:    Chemistry      Component Value Date/Time    NA 140 04/04/2011 0933   K 3.3* 04/04/2011 0933   CL 100 04/04/2011 0933   CO2 36* 04/04/2011 0933   BUN 17 04/04/2011 0933   CREATININE 0.51 04/04/2011 0933      Component Value Date/Time   CALCIUM 9.9 04/04/2011 0933   ALKPHOS 72 04/04/2011 0933   AST 17 04/04/2011 0933   ALT 10 04/04/2011 0933   BILITOT 0.9 04/04/2011 0933      Lab Results  Component Value Date   WBC 3.8* 04/04/2011   HGB 13.2 04/04/2011   HCT 38.8 04/04/2011   MCV 95.2 04/04/2011   PLT 289 04/04/2011       PATHOLOGY: ADDITIONAL INFORMATION: PROGNOSTIC INDICATORS - ACIS Results IMMUNOHISTOCHEMICAL AND MORPHOMETRIC ANALYSIS BY THE AUTOMATED CELLULAR IMAGING SYSTEM (ACIS) Estrogen Receptor (Negative, <1%): 8%, WEAK STAINING INTENSITY Progesterone Receptor (Negative, <1%): 7%, WEAK STAINING INTENSITY All controls stained appropriately Pecola Leisure MD Pathologist, Electronic Signature ( Signed 03/30/2011) FINAL DIAGNOSIS Diagnosis Breast, left, needle core biopsy, UOQ - DUCTAL CARCINOMA IN SITU WITH ASSOCIATED CALCIFICATION. - SEE COMMENT. Microscopic Comment Sections from the left upper outer quadrant needle core biopsy show ductal carcinoma in situ with associated calcification. The ductal carcinoma in situ is intermediate nuclear grade with comedo-type necrosis which is most consistent with a high grade ductal carcinoma in situ. ER and PR markers will be performed on the specimen and reported in an addendum. Dr. Frederica Kuster has seen this case in consultation with agreement. The findings are called to [facility] on 03/28/2011. (RAH:eps 03/28/11)  ASSESSMENT: 71 year old female with:    #1 ductal carcinoma in situ with associated calcifications of the left upper-outer quadrant patient is status post needle core biopsy performed on 03/25/2011. This was a high-grade DCIS, ER 8% weakly staining PR 7% weakly staining. Patient was seen in the multidisciplinary breast clinic today. She was seen by Dr.  Lurline Hare Dr. Harriette Bouillon and myself. The maximum diameter of the tumor and calcifications was about 2.5 cm by mammogram. Post biopsy MRI showed only a 1.2 cm area of enhancement with hematoma.  #2 hypertension and heart disease  #3 mild osteoarthritis some knee joint involvement.  Clinical Trial Eligibility:  NSABP B-43 Trial enrollment was discussed with the patient and she has agreed to enroll in this clinical trial. She will meet with the research nurses after she has her definitive surgery.  Multidisciplinary conference discussion Patient's case was discussed at the multidisciplinary breast clinic And a lumpectomy followed by radiation therapy and antiestrogen therapy was discussed with the possibility of patient enrolling on a clinical trial.    PLAN:    #1 patient and I met today to discuss adjuvant antiestrogen therapy after her lumpectomy. We also discussed clinical trial utilizing Herceptin in DCIS following the enrollment of NSABP B. 43 study. Asks and benefits of antiestrogen therapy were discussed with her as a preventive. Also discussed the clinical trial in detail.  #2 patient will be having her lumpectomy with sentinel node biopsy first by Dr. Luisa Hart. Hopefully this can be done within the next one month. And I will plan on seeing her back thereafter. At her next visit with me we will discuss the clinical  study and I will have her meet with my nurses.     Discussion: Patient is being treated per NCCN breast cancer care guidelines appropriate for stage 0 (DCIS)   Thank you so much for allowing me to participate in the care of [facility]. I will continue to follow up the patient with you and assist in her care.  All questions were answered. The patient knows to call the clinic with any problems, questions or concerns. We can certainly see the patient much sooner if necessary.  I spent 60 minutes counseling the patient face to face. The total time spent in the  appointment was 60 minutes.  Drue Second, MD Medical/Oncology [facility] [phone] (beeper) [phone] (Office)  04/06/2011, 12:02 PM 04/06/2011, 12:02 PM

## 2011-04-06 NOTE — Telephone Encounter (Signed)
gave patient appointment for 05-10-2011 starting at 10:00am printed out calendar and gave to the patient

## 2011-04-06 NOTE — Progress Notes (Signed)
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  HISTORY OF PRESENT ILLNESS::Kara Diaz is a 71 y.o. female who went to a screening mammogram in September. The calcifications were noted. A biopsy was recommended. She presented for a followup earlier this month. The calcifications had slightly increased in size. Her located in the upper outer quadrants. Measured approximately 2.5 x 1.4 x 2 cm. 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 She had a MRI which showed a 1.2 cm hematoma. She presents for our opinion regarding radiation and the management of her disease today. She had no issues with her biopsy. She had no pain.  PREVIOUS RADIATION THERAPY: No  PAST MEDICAL HISTORY:  has a past medical history of Hypertension and Angina.    PAST SURGICAL HISTORY: Past Surgical History  Procedure Date  . Cholecystectomy   . Abdominal hysterectomy   . Rotator cuff surgery     FAMILY HISTORY:  She has 3 cousins with breast cancer.    SOCIAL HISTORY:  reports that she has never smoked. She has never used smokeless tobacco. She reports that she drinks alcohol. She reports that she does not use illicit drugs.  ALLERGIES: Review of patient's allergies indicates no known allergies.  MEDICATIONS:  Current Outpatient Prescriptions  Medication Sig Dispense Refill  . amLODipine-benazepril (LOTREL) 10-20 MG per capsule Take 1 capsule by mouth daily.      . nitroGLYCERIN (NITROSTAT) 0.4 MG SL tablet Place 0.4 mg under the tongue every 5 (five) minutes as needed. Take as directed      . potassium chloride (K-DUR) 10 MEQ tablet Take 20 mEq by  mouth 2 (two) times daily.      Marland Kitchen triamterene-hydrochlorothiazide (DYAZIDE) 37.5-25 MG per capsule Take 1 capsule by mouth every morning.        REVIEW OF SYSTEMS:  A 15 point review of systems is documented in the electronic medical record. This was obtained by the nursing staff. However, I reviewed this with the patient to discuss relevant findings and make appropriate changes.  A comprehensive review of systems was negative.   PHYSICAL EXAM:  vitals were not taken for this visit.  There were no vitals taken for this visit.  General Appearance:    Alert, cooperative, no distress, appears stated age  Head:    Normocephalic, without obvious abnormality, atraumatic  Eyes:    PERRL, conjunctiva/corneas clear, EOM's intact, fundi    benign, both eyes  Ears:    Normal TM's and external ear canals, both ears  Nose:   Nares normal, septum midline, mucosa normal, no drainage    or sinus tenderness  Throat:   Lips, mucosa, and tongue normal; teeth and gums normal  Neck:   Supple, symmetrical, trachea midline, no adenopathy;    thyroid:  no enlargement/tenderness/nodules; no carotid   bruit or JVD  Back:     Symmetric, no curvature, ROM normal, no CVA tenderness  Lungs:     Clear to auscultation bilaterally, respirations unlabored  Chest Wall:    No tenderness or deformity   Heart:    Regular rate and rhythm, S1 and S2 normal, no murmur, rub  or gallop  Breast Exam:    She has a small biopsy site in the left breast. No tenderness, masses, or nipple abnormality  Abdomen:     Soft, non-tender, bowel sounds active all four quadrants,    no masses, no organomegaly  Genitalia:    Normal female without lesion, discharge or tenderness  Rectal:    Normal tone, normal prostate, no masses or tenderness;   guaiac negative stool  Extremities:   Extremities normal, atraumatic, no cyanosis or edema  Pulses:   2+ and symmetric all extremities  Skin:   Skin color, texture, turgor normal, no rashes or lesions    Lymph nodes:   Cervical, supraclavicular, and axillary nodes normal  Neurologic:   CNII-XII intact, normal strength, sensation and reflexes    throughout    LABORATORY DATA:  Lab Results  Component Value Date   WBC 3.8* 04/04/2011   HGB 13.2 04/04/2011   HCT 38.8 04/04/2011   MCV 95.2 04/04/2011   PLT 289 04/04/2011   Lab Results  Component Value Date   NA 140 04/04/2011   K 3.3* 04/04/2011   CL 100 04/04/2011   CO2 36* 04/04/2011   Lab Results  Component Value Date   ALT 10 04/04/2011   AST 17 04/04/2011   ALKPHOS 72 04/04/2011   BILITOT 0.9 04/04/2011     RADIOGRAPHY: Mr Breast Bilateral W Wo Contrast  04/04/2011  *RADIOLOGY REPORT*  Clinical Data: New diagnosis left sided breast cancer.  BILATERAL BREAST MRI WITH AND WITHOUT CONTRAST  Technique: Multiplanar, multisequence MR images of both breasts were obtained prior to and following the intravenous administration of 18ml of Multihance.  Three dimensional images were evaluated at the independent DynaCad workstation.  Comparison:  Mammogram dated 04/01/2011  Findings: Postbiopsy changes are seen in the upper-outer quadrant of the left breast, middle third with a 1.2 cm hematoma. Peripheral enhancement seen at the biopsy cavity without additional clumped and linear enhancement.  A biopsy clip is in place.  No other suspicious mass or enhancement seen in either breast.  No axillary or internal mammary adenopathy is seen.  Benign cysts or hemangiomas are seen in the liver.  IMPRESSION: Postbiopsy changes at the site of known malignancy, left breast without significant residual enhancement.  No MRI specific evidence of malignancy, right breast.  THREE-DIMENSIONAL MR IMAGE RENDERING ON INDEPENDENT WORKSTATION:  Three-dimensional MR images were rendered by post-processing of the original MR data on an independent workstation.  The three- dimensional MR images were interpreted, and findings were reported in the accompanying complete MRI report for this  study.  BI-RADS CATEGORY 6:  Known biopsy-proven malignancy - appropriate action should be taken.  Original Report Authenticated By: Hiram Gash, M.D.   Mm Breast Stereo Biopsy Left  04/05/2011  **ADDENDUM** CREATED: 04/05/2011 13:54:42  The area of abnormal calcification in the left breast is felt to measure approximately 1.4 x 2.0 x 2.5 cm.  Some of calcifications included in the original measurement have been stable for several years and are felt to be benign in etiology.  Addended by:  Cain Saupe, M.D. on 04/05/2011 13:54:42.  **END ADDENDUM** SIGNED BY: Cain Saupe, M.D.    03/28/2011  **ADDENDUM** CREATED: 03/28/2011 13:29:54  Biopsy results revealed DCIS with calcifications and necrosis. This is concordant with imaging findings.  This is discussed with the patient by telephone at 12:05 p.m. on 03/28/2010. She states the site is healing well.  A breast MRI is scheduled for 04/01/2011 at 8:30  a.m.  [facility] will [facility]2011.  She is instructed to call the [facility] with further questions or concerns.  Addended by:  Hiram Gash, M.D. on 03/28/2011 13:29:54.  **END ADDENDUM** SIGNED BY: Hiram Gash, M.D.    03/25/2011  *RADIOLOGY REPORT*  Clinical Data:  Patient presents for stereotactic needle biopsy of a group of indeterminate microcalcifications of the upper outer quadrant of the left breast.  STEREOTACTIC-GUIDED VACUUM ASSISTED BIOPSY OF THE LEFT BREAST AND SPECIMEN RADIOGRAPH  I met with the patient and we discussed the procedure of stereotactic-guided biopsy, including benefits and alternatives. We discussed the high likelihood of a successful procedure. We discussed the risks of the procedure, including infection, bleeding, tissue injury, clip migration, and inadequate sampling. Informed, written consent was given.  Using sterile technique, 2% lidocaine, stereotactic guidance, and a 9 gauge vacuum assisted device, biopsy was performed of  the targeted microcalcifications of the upper outer left breast. Specimen radiograph was performed, showing multiple of the targeted microcalcifications.  Specimens with calcifications are identified for pathology.  At the conclusion of the procedure, a tissue marker clip was deployed into the biopsy cavity.  Follow-up 2-view mammogram confirmed T-shaped clip placement along the anterior margin of the biopsy site.  IMPRESSION: Stereotactic-guided biopsy of indeterminate microcalcifications upper outer quadrant left breast.  No apparent complications.  Original Report Authenticated By: Daryl Eastern, M.D.   Mm Breast Surgical Specimen  04/05/2011  **ADDENDUM** CREATED: 04/05/2011 13:54:42  The area of abnormal calcification in the left breast is felt to measure approximately 1.4 x 2.0 x 2.5 cm.  Some of calcifications included in the original measurement have been stable for several years and are felt to be benign in etiology.  Addended by:  Cain Saupe, M.D. on 04/05/2011 13:54:42.  **END ADDENDUM** SIGNED BY: Cain Saupe, M.D.    03/28/2011  **ADDENDUM** CREATED: 03/28/2011 13:29:54  Biopsy results revealed DCIS with calcifications and necrosis. This is concordant with imaging findings.  This is discussed with the patient by telephone at 12:05 p.m. on 03/28/2010. She states the site is healing well.  A breast MRI is scheduled for 04/01/2011 at 8:30 a.m.  She will be seen [facility] on 04/06/2011.  She is instructed to call the Breast Center with further questions or concerns.  Addended by:  Hiram Gash, M.D. on 03/28/2011 13:29:54.  **END ADDENDUM** SIGNED BY: Hiram Gash, M.D.    03/25/2011  *RADIOLOGY REPORT*  Clinical Data:  Patient presents for stereotactic needle biopsy of a group of indeterminate microcalcifications of the upper outer quadrant of the left breast.  STEREOTACTIC-GUIDED VACUUM ASSISTED BIOPSY OF THE LEFT BREAST AND SPECIMEN RADIOGRAPH  I met with  the patient and we discussed the procedure of stereotactic-guided biopsy, including benefits and alternatives. We discussed the high likelihood of a successful procedure. We discussed the risks of the procedure, including infection, bleeding, tissue injury, clip migration, and inadequate sampling. Informed, written consent was given.  Using sterile technique, 2% lidocaine, stereotactic guidance, and a 9 gauge vacuum assisted device, biopsy was performed of the targeted microcalcifications of the upper outer left breast. Specimen radiograph was performed, showing multiple of the targeted microcalcifications.  Specimens with calcifications are identified for pathology.  At the conclusion of the procedure, a tissue marker clip was deployed into the biopsy cavity.  Follow-up 2-view mammogram confirmed T-shaped clip placement along the anterior margin of the biopsy site.  IMPRESSION: Stereotactic-guided biopsy of indeterminate microcalcifications  upper outer quadrant left breast.  No apparent complications.  Original Report Authenticated By: Daryl Eastern, M.D.   Mm Digital Diagnostic Unilat R  04/01/2011  *RADIOLOGY REPORT*  Clinical Data:  New diagnosis, left sided breast cancer. Evaluation before MRI.  DIGITAL DIAGNOSTIC RIGHT MAMMOGRAM WITH CAD  Comparison:  Multiple priors  Findings:  There is a fibrofatty pattern.  No mass or malignant type calcifications are seen.  Compared to priors. Mammographic images were processed with CAD.  IMPRESSION: No mammographic evidence of malignancy, right breast.  The patient is scheduled for a breast MRI on Monday, March 25.  BI-RADS CATEGORY 1:  Negative.  Original Report Authenticated By: Hiram Gash, M.D.      IMPRESSION: 71 year old female with newly diagnosed DCIS  PLAN: Kara Diaz met with myself Dr. Luisa Hart Dr. Park Breed today for discussion of management of her disease. We discussed breast conservation. We discussed the role of radiation and decreasing local  failure and patient to undergo breast conservation. He discussed 6 weeks of treatment. We discussed the process of simulation and placement of tattoos. We discussed the possible acute and chronic side effects of treatment including but not limited to the skin redness irritation darkness and fatigue. We discussed the possibility of heart and lung damage in the technology used to avoid those effects. We discussed possible prone breast treatment due to her pendulous breast size. We discussed the possibility of enrollment on the 43. She will discuss this in further detail with Dr. Welton Flakes. I will plan on seeing her back after her surgery. 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 Also able to meet with a member of our patient family support team as well as a survivor. I spent 60 minutes minutes face to face with the patient and more than 50% of that time was spent in counseling and/or coordination of care.   -----------------------------------------------

## 2011-04-06 NOTE — Patient Instructions (Signed)
Lumpectomy, Breast Conserving Surgery A lumpectomy is breast surgery that removes only part of the breast. Another name used may be partial mastectomy. The amount removed varies. Make sure you understand how much of your breast will be removed. Reasons for a lumpectomy:  Any solid breast mass.   Grouped significant nodularity that may be confused with a solitary breast mass.  Lumpectomy is the most common form of breast cancer surgery today. The surgeon removes the portion of your breast which contains the tumor (cancer). This is the lump. Some normal tissue around the lump is also removed to be sure that all the tumor has been removed.  If cancer cells are found in the margins where the breast tissue was removed, your surgeon will do more surgery to remove the remaining cancer tissue. This is called re-excision surgery. Radiation and/or chemotherapy treatments are often given following a lumpectomy to kill any cancer cells that could possibly remain.  REASONS YOU MAY NOT BE ABLE TO HAVE BREAST CONSERVING SURGERY:  The tumor is located in more than one place.   Your breast is small and the tumor is large so the breast would be disfigured.   The entire tumor removal is not successful with a lumpectomy.   You cannot commit to a full course of chemotherapy, radiation therapy or are pregnant and cannot have radiation.   You have previously had radiation to the breast to treat cancer.  HOW A LUMPECTOMY IS PERFORMED If overnight nursing is not required following a biopsy, a lumpectomy can be performed as a same-day surgery. This can be done in a hospital, clinic, or surgical center. The anesthesia used will depend on your surgeon. They will discuss this with you. A general anesthetic keeps you sleeping through the procedure. LET YOUR CAREGIVERS KNOW ABOUT THE FOLLOWING:  Allergies   Medications taken including herbs, eye drops, over the counter medications, and creams.   Use of steroids (by  mouth or creams)   Previous problems with anesthetics or Novocaine.   Possibility of pregnancy, if this applies   History of blood clots (thrombophlebitis)   History of bleeding or blood problems.   Previous surgery   Other health problems  BEFORE THE PROCEDURE You should be present one hour prior to your procedure unless directed otherwise.  AFTER THE PROCEDURE  After surgery, you will be taken to the recovery area where a nurse will watch and check your progress. Once you're awake, stable, and taking fluids well, barring other problems you will be allowed to go home.   Ice packs applied to your operative site may help with discomfort and keep the swelling down.   A small rubber drain may be placed in the breast for a couple of days to prevent a hematoma from developing in the breast.   A pressure dressing may be applied for 24 to 48 hours to prevent bleeding.   Keep the wound dry.   You may resume a normal diet and activities as directed. Avoid strenuous activities affecting the arm on the side of the biopsy site such as tennis, swimming, heavy lifting (more than 10 pounds) or pulling.   Bruising in the breast is normal following this procedure.   Wearing a bra - even to bed - may be more comfortable and also help keep the dressing on.   Change dressings as directed.   Only take over-the-counter or prescription medicines for pain, discomfort, or fever as directed by your caregiver.  Call for your results as   instructed by your surgeon. Remember it is your responsibility to get the results of your lumpectomy if your surgeon asked you to follow-up. Do not assume everything is fine if you have not heard from your caregiver. SEEK MEDICAL CARE IF:   There is increased bleeding (more than a small spot) from the wound.   You notice redness, swelling, or increasing pain in the wound.   Pus is coming from wound.   An unexplained oral temperature above 102 F (38.9 C) develops.     You notice a foul smell coming from the wound or dressing.  SEEK IMMEDIATE MEDICAL CARE IF:   You develop a rash.   You have difficulty breathing.   You have any allergic problems.  Document Released: 02/07/2006 Document Revised: 12/16/2010 Document Reviewed: 05/11/2006 [employer] Patient Information 2012 [employer]. Lumpectomy, Breast Conserving Surgery Care After Please read the instructions outlined below and refer to this sheet in the next few weeks. These discharge instructions provide you with general information on caring for yourself after you leave the hospital. Your surgeon may also give you specific instructions. While your treatment has been planned according to the most current medical practices available, unavoidable complications occasionally occur. If you have any problems or questions after discharge, please call your surgeon. Reasons for a lumpectomy:  Any solid breast mass.   Grouped significant nodularity that may be confused with a solitary breast mass.  AFTER THE PROCEDURE  After surgery, you will be taken to the recovery area where a nurse will watch and check your progress. Once you're awake, stable, and taking fluids well, barring other problems you will be allowed to go home.   Ice packs applied to your operative site may help with discomfort and keep the swelling down.   A small rubber drain may be placed in the incision for a couple of days to prevent a hematoma in the breast.   A pressure dressing may be applied for 24 to 48 hours to prevent bleeding.   Keep the wound dry.   You may resume a normal diet and activities as directed. Avoid strenuous activities affecting the arm on the side of the biopsy site such as tennis, swimming, heavy lifting (more than 10 pounds) or pulling.   Bruising in the breast is normal following this procedure.   Wearing a bra - even to bed - may be more comfortable and also help keep the dressing on.   Change  dressings as directed.   Only take over-the-counter or prescription medicines for pain, discomfort, or fever as directed by your caregiver.  Call for your results as instructed by your surgeon. Remember it isyour responsibility to get the results of your lumpectomy if your surgeon asked you to follow-up. Do not assume everything is fine if you have not heard from your caregiver. SEEK MEDICAL CARE IF:   There is increased bleeding (more than a small spot) from the wound.   You notice redness, swelling, or increasing pain in the wound.   Pus is coming from wound.   An unexplained oral temperature above 102 F (38.9 C) develops.   You notice a foul smell coming from the wound or dressing.  SEEK IMMEDIATE MEDICAL CARE IF:   You develop a rash.   You have difficulty breathing.   You have any allergic problems.  Document Released: 01/12/2006 Document Revised: 12/16/2010 Document Reviewed: 12/15/2006 [employer] Patient Information 2012 [employer]. 

## 2011-04-06 NOTE — Progress Notes (Signed)
Patient ID: Kara Diaz, female   DOB: 02-16-1940, 71 y.o.   MRN: [id number]  No chief complaint on file.   HPI Kara Diaz is a 71 y.o. female.  Pt presents at the request of Dr August Saucer due to abnormal mammogram and left breast microcalcifications biopsy proven DCIS  Measuring 2 cm in central upper breast.  She denies mass,  Pain or discharge. HPI  Past Medical History  Diagnosis Date  . Hypertension   . Angina     Past Surgical History  Procedure Date  . Cholecystectomy   . Abdominal hysterectomy   . Rotator cuff surgery     No family history on file.  Social History History  Substance Use Topics  . Smoking status: Never Smoker   . Smokeless tobacco: Never Used  . Alcohol Use: Yes    No Known Allergies  Current Outpatient Prescriptions  Medication Sig Dispense Refill  . amLODipine-benazepril (LOTREL) 10-20 MG per capsule Take 1 capsule by mouth daily.      . nitroGLYCERIN (NITROSTAT) 0.4 MG SL tablet Place 0.4 mg under the tongue every 5 (five) minutes as needed. Take as directed      . potassium chloride (K-DUR) 10 MEQ tablet Take 20 mEq by mouth 2 (two) times daily.      Marland Kitchen triamterene-hydrochlorothiazide (DYAZIDE) 37.5-25 MG per capsule Take 1 capsule by mouth every morning.        Review of Systems Review of Systems  Constitutional: Negative for fever, chills and unexpected weight change.  HENT: Negative for hearing loss, congestion, sore throat, trouble swallowing and voice change.   Eyes: Negative for visual disturbance.  Respiratory: Negative for cough and wheezing.   Cardiovascular: Negative for chest pain, palpitations and leg swelling.  Gastrointestinal: Negative for nausea, vomiting, abdominal pain, diarrhea, constipation, blood in stool, abdominal distention and anal bleeding.  Genitourinary: Negative for hematuria, vaginal bleeding and difficulty urinating.  Musculoskeletal: Negative for arthralgias.  Skin: Negative for rash and wound.    Neurological: Negative for seizures, syncope and headaches.  Hematological: Negative for adenopathy. Does not bruise/bleed easily.  Psychiatric/Behavioral: Negative for confusion.    T 98  HR 70  BP STABLE  Physical Exam Physical Exam  Constitutional: She appears well-developed and well-nourished.  HENT:  Head: Normocephalic and atraumatic.  Eyes: EOM are normal. Pupils are equal, round, and reactive to light.  Neck: Normal range of motion. Neck supple.  Cardiovascular: Normal rate and regular rhythm.   Pulmonary/Chest: Effort normal and breath sounds normal.       Large pedulous breasts bilateral.  Small hematoma left breast central upper portion.  Right breast normal.  Bilateral axilla normal  Abdominal: Soft. Bowel sounds are normal.  Musculoskeletal: Normal range of motion.  Neurological: She is alert.  Skin: Skin is warm and dry.  Psychiatric: She has a normal mood and affect. Her behavior is normal. Judgment and thought content normal.    Data Reviewed Mammogram and MRI show 2 cm cluster of micro calcifications central upper breast.   Assessment    Left breast DCIS    Plan    Pt desires left breast lumpectomy with needle localization.The procedure has been discussed with the patient. Alternatives to surgery have been discussed with the patient.  Risks of surgery include bleeding,  Infection,  Seroma formation, death,  and the need for further surgery.   The patient understands and wishes to proceed.       Kara Diaz A. 04/06/2011, 10:20 AM

## 2011-04-11 ENCOUNTER — Encounter: Payer: Self-pay | Admitting: *Deleted

## 2011-04-11 NOTE — Progress Notes (Signed)
Mailed after appt letter to pt. 

## 2011-04-14 ENCOUNTER — Telehealth: Payer: Self-pay | Admitting: *Deleted

## 2011-04-14 ENCOUNTER — Encounter: Payer: Self-pay | Admitting: *Deleted

## 2011-04-14 NOTE — Telephone Encounter (Signed)
per staff message from Joanna on 04-14-2011 changed patient appointment to 05-13-2011 starting at 1:30pm

## 2011-04-15 ENCOUNTER — Telehealth: Payer: Self-pay | Admitting: *Deleted

## 2011-04-15 NOTE — Telephone Encounter (Signed)
Left vm requesting pt to return call regarding [facility] from 3/27

## 2011-04-18 ENCOUNTER — Telehealth: Payer: Self-pay | Admitting: *Deleted

## 2011-04-18 NOTE — Telephone Encounter (Signed)
Spoke with pt concerning [facility] from 3/27.  Pt denies questions or concerns regarding dx or treatment care plan.  Confirmed surgery date and f/u appts with Drs. Welton Flakes and [place].  Encourage pt to call with needs.  Contact information given.

## 2011-04-22 ENCOUNTER — Encounter (HOSPITAL_COMMUNITY): Payer: Self-pay

## 2011-04-27 DIAGNOSIS — I10 Essential (primary) hypertension: Secondary | ICD-10-CM | POA: Diagnosis not present

## 2011-04-27 DIAGNOSIS — Z853 Personal history of malignant neoplasm of breast: Secondary | ICD-10-CM | POA: Diagnosis not present

## 2011-04-27 DIAGNOSIS — M899 Disorder of bone, unspecified: Secondary | ICD-10-CM | POA: Diagnosis not present

## 2011-04-27 DIAGNOSIS — M25569 Pain in unspecified knee: Secondary | ICD-10-CM | POA: Diagnosis not present

## 2011-05-02 ENCOUNTER — Telehealth (INDEPENDENT_AMBULATORY_CARE_PROVIDER_SITE_OTHER): Payer: Self-pay | Admitting: General Surgery

## 2011-05-02 ENCOUNTER — Encounter (HOSPITAL_COMMUNITY): Payer: Self-pay

## 2011-05-02 ENCOUNTER — Encounter (HOSPITAL_COMMUNITY)
Admission: RE | Admit: 2011-05-02 | Discharge: 2011-05-02 | Disposition: A | Discharge status: Home / Self Care | Payer: Medicare Other | Source: Ambulatory Visit | Attending: Surgery | Admitting: Surgery

## 2011-05-02 VITALS — BP 167/88 | HR 71 | Temp 97.1°F | Resp 20 | Ht 64.0 in | Wt 204.1 lb

## 2011-05-02 DIAGNOSIS — Z01818 Encounter for other preprocedural examination: Secondary | ICD-10-CM | POA: Diagnosis not present

## 2011-05-02 DIAGNOSIS — Z01812 Encounter for preprocedural laboratory examination: Secondary | ICD-10-CM | POA: Diagnosis not present

## 2011-05-02 DIAGNOSIS — D0510 Intraductal carcinoma in situ of unspecified breast: Secondary | ICD-10-CM

## 2011-05-02 DIAGNOSIS — Z01811 Encounter for preprocedural respiratory examination: Secondary | ICD-10-CM | POA: Diagnosis not present

## 2011-05-02 DIAGNOSIS — I10 Essential (primary) hypertension: Secondary | ICD-10-CM | POA: Diagnosis not present

## 2011-05-02 DIAGNOSIS — D0590 Unspecified type of carcinoma in situ of unspecified breast: Secondary | ICD-10-CM | POA: Diagnosis not present

## 2011-05-02 HISTORY — DX: Pneumonia, unspecified organism: J18.9

## 2011-05-02 HISTORY — DX: Pain in unspecified joint: M25.50

## 2011-05-02 HISTORY — DX: Other specified postprocedural states: R11.2

## 2011-05-02 HISTORY — DX: Unspecified osteoarthritis, unspecified site: M19.90

## 2011-05-02 HISTORY — DX: Other specified postprocedural states: Z98.890

## 2011-05-02 HISTORY — DX: Hyperlipidemia, unspecified: E78.5

## 2011-05-02 HISTORY — DX: Insomnia, unspecified: G47.00

## 2011-05-02 HISTORY — DX: Frequency of micturition: R35.0

## 2011-05-02 HISTORY — DX: Unspecified cataract: H26.9

## 2011-05-02 HISTORY — DX: Effusion, unspecified joint: M25.40

## 2011-05-02 LAB — CBC
Platelets: 328 10*3/uL (ref 150–400)
RBC: 4.22 MIL/uL (ref 3.87–5.11)
RDW: 12.5 % (ref 11.5–15.5)
WBC: 4.3 10*3/uL (ref 4.0–10.5)

## 2011-05-02 LAB — DIFFERENTIAL
Basophils Relative: 0 % (ref 0–1)
Eosinophils Relative: 2 % (ref 0–5)
Monocytes Absolute: 0.4 10*3/uL (ref 0.1–1.0)
Monocytes Relative: 8 % (ref 3–12)
Neutro Abs: 1.9 10*3/uL (ref 1.7–7.7)

## 2011-05-02 LAB — COMPREHENSIVE METABOLIC PANEL
ALT: 12 U/L (ref 0–35)
AST: 17 U/L (ref 0–37)
Albumin: 4 g/dL (ref 3.5–5.2)
CO2: 32 mEq/L (ref 19–32)
Chloride: 101 mEq/L (ref 96–112)
Creatinine, Ser: 0.55 mg/dL (ref 0.50–1.10)
Potassium: 3 mEq/L — ABNORMAL LOW (ref 3.5–5.1)
Sodium: 142 mEq/L (ref 135–145)
Total Bilirubin: 0.7 mg/dL (ref 0.3–1.2)

## 2011-05-02 MED ORDER — CHLORHEXIDINE GLUCONATE 4 % EX LIQD: 1.0000 "application " | Freq: Once | CUTANEOUS | Status: DC

## 2011-05-02 NOTE — Progress Notes (Addendum)
Dr.harwani is cardiologist;last visit in Mar 2013-to request report  Stress test done 12/01/10 report in epic Results of heart cath in epic from 12/16/10 Denies ever having an echo  Medical MD is Dr.Eric August Saucer in [place]  Pt has never had to use Ntg

## 2011-05-02 NOTE — Telephone Encounter (Signed)
Pt calling because has surgery scheduled next Monday.  She has lost a filling and going to [facility] to get it repaired.  Asking if this is okay and won't delay her surgery.  Pt advised to proceed with whatever dental work is needed.

## 2011-05-02 NOTE — Consult Note (Signed)
Anesthesiology chart review:  Ms. Kara Diaz work was reviewed. The potassium is 3.0 she was on Maxzide for hypertension. This is an acceptable value for surgery.  [name] M.D.

## 2011-05-02 NOTE — Pre-Procedure Instructions (Signed)
20 Aleanna Menge  05/02/2011   Your procedure is scheduled on:  Mon, April 29 @ 1000 AM  Report to [facility] at 0800 AM.  Call this number if you have problems the morning of surgery: [phone]   Remember:   Do not eat food:After Midnight.  May have clear liquids: up to 4 Hours before arrival.(until 4:00 am)  Clear liquids include soda, tea, black coffee, apple or grape juice, broth,water  Take these medicines the morning of surgery with A SIP OF WATER: Lotrel   Do not wear jewelry, make-up or nail polish.  Do not wear lotions, powders, or perfumes.   Do not shave 48 hours prior to surgery.  Do not bring valuables to the hospital.  Contacts, dentures or bridgework may not be worn into surgery.  Leave suitcase in the car. After surgery it may be brought to your room.  For patients admitted to the hospital, checkout time is 11:00 AM the day of discharge.   Patients discharged the day of surgery will not be allowed to drive home.  Special Instructions: CHG Shower Use Special Wash: 1/2 bottle night before surgery and 1/2 bottle morning of surgery.   Please read over the following fact sheets that you were given: Pain Booklet, Coughing and Deep Breathing, MRSA Information and Surgical Site Infection Prevention

## 2011-05-06 DIAGNOSIS — H2510 Age-related nuclear cataract, unspecified eye: Secondary | ICD-10-CM | POA: Diagnosis not present

## 2011-05-08 MED ORDER — CEFAZOLIN SODIUM-DEXTROSE 2-3 GM-% IV SOLR
2.0000 g | INTRAVENOUS | Status: AC
  Administered 2011-05-09: 2 g via INTRAVENOUS
  Filled 2011-05-08: qty 50

## 2011-05-09 ENCOUNTER — Ambulatory Visit (HOSPITAL_COMMUNITY)
Admission: RE | Admit: 2011-05-09 | Discharge: 2011-05-09 | Disposition: A | Discharge status: Home / Self Care | Payer: Medicare Other | Source: Ambulatory Visit | Attending: Surgery | Admitting: Surgery

## 2011-05-09 ENCOUNTER — Encounter (HOSPITAL_COMMUNITY)
Admission: RE | Disposition: A | Discharge status: Home / Self Care | Payer: Self-pay | Source: Ambulatory Visit | Attending: Surgery

## 2011-05-09 ENCOUNTER — Ambulatory Visit
Admission: RE | Admit: 2011-05-09 | Discharge: 2011-05-09 | Disposition: A | Discharge status: Home / Self Care | Payer: Medicare Other | Source: Ambulatory Visit | Attending: Surgery | Admitting: Surgery

## 2011-05-09 ENCOUNTER — Ambulatory Visit (HOSPITAL_COMMUNITY): Payer: Medicare Other | Admitting: Anesthesiology

## 2011-05-09 ENCOUNTER — Encounter (HOSPITAL_COMMUNITY): Payer: Self-pay | Admitting: Surgery

## 2011-05-09 ENCOUNTER — Encounter (HOSPITAL_COMMUNITY): Payer: Self-pay | Admitting: Anesthesiology

## 2011-05-09 DIAGNOSIS — D0510 Intraductal carcinoma in situ of unspecified breast: Secondary | ICD-10-CM

## 2011-05-09 DIAGNOSIS — D0590 Unspecified type of carcinoma in situ of unspecified breast: Secondary | ICD-10-CM | POA: Insufficient documentation

## 2011-05-09 DIAGNOSIS — C50919 Malignant neoplasm of unspecified site of unspecified female breast: Secondary | ICD-10-CM | POA: Diagnosis not present

## 2011-05-09 DIAGNOSIS — I10 Essential (primary) hypertension: Secondary | ICD-10-CM | POA: Insufficient documentation

## 2011-05-09 DIAGNOSIS — Z01818 Encounter for other preprocedural examination: Secondary | ICD-10-CM | POA: Insufficient documentation

## 2011-05-09 DIAGNOSIS — Z01812 Encounter for preprocedural laboratory examination: Secondary | ICD-10-CM | POA: Insufficient documentation

## 2011-05-09 HISTORY — PX: BREAST LUMPECTOMY: SHX2

## 2011-05-09 SURGERY — BREAST LUMPECTOMY WITH NEEDLE LOCALIZATION
Anesthesia: General | Site: Breast | Laterality: Left | Wound class: Clean

## 2011-05-09 MED ORDER — MIDAZOLAM HCL 2 MG/2ML IJ SOLN: 1.0000 mg | INTRAMUSCULAR | Status: DC | PRN

## 2011-05-09 MED ORDER — GLYCOPYRROLATE 0.2 MG/ML IJ SOLN
INTRAMUSCULAR | Status: DC | PRN
  Administered 2011-05-09: 0.2 mg via INTRAVENOUS

## 2011-05-09 MED ORDER — LORAZEPAM 2 MG/ML IJ SOLN: 1.0000 mg | Freq: Once | INTRAMUSCULAR | Status: DC | PRN

## 2011-05-09 MED ORDER — HYDROCODONE-ACETAMINOPHEN 10-325 MG PO TABS: 1.0000 | ORAL_TABLET | ORAL | Status: AC | PRN

## 2011-05-09 MED ORDER — FENTANYL CITRATE 0.05 MG/ML IJ SOLN
INTRAMUSCULAR | Status: DC | PRN
  Administered 2011-05-09: 25 ug via INTRAVENOUS
  Administered 2011-05-09: 50 ug via INTRAVENOUS
  Administered 2011-05-09 (×2): 25 ug via INTRAVENOUS

## 2011-05-09 MED ORDER — HYDROMORPHONE HCL PF 1 MG/ML IJ SOLN: 0.2500 mg | INTRAMUSCULAR | Status: DC | PRN

## 2011-05-09 MED ORDER — BUPIVACAINE-EPINEPHRINE 0.25% -1:200000 IJ SOLN
INTRAMUSCULAR | Status: DC | PRN
  Administered 2011-05-09: 30 mL

## 2011-05-09 MED ORDER — MIDAZOLAM HCL 5 MG/5ML IJ SOLN
INTRAMUSCULAR | Status: DC | PRN
  Administered 2011-05-09: 2 mg via INTRAVENOUS

## 2011-05-09 MED ORDER — FENTANYL CITRATE 0.05 MG/ML IJ SOLN: 50.0000 ug | INTRAMUSCULAR | Status: DC | PRN

## 2011-05-09 MED ORDER — LIDOCAINE HCL (CARDIAC) 20 MG/ML IV SOLN
INTRAVENOUS | Status: DC | PRN
  Administered 2011-05-09: 60 mg via INTRAVENOUS

## 2011-05-09 MED ORDER — LACTATED RINGERS IV SOLN
INTRAVENOUS | Status: DC
  Administered 2011-05-09: 10:00:00 via INTRAVENOUS

## 2011-05-09 MED ORDER — PROPOFOL 10 MG/ML IV EMUL
INTRAVENOUS | Status: DC | PRN
  Administered 2011-05-09: 40 mg via INTRAVENOUS
  Administered 2011-05-09: 160 mg via INTRAVENOUS

## 2011-05-09 MED ORDER — ONDANSETRON HCL 4 MG/2ML IJ SOLN
INTRAMUSCULAR | Status: DC | PRN
  Administered 2011-05-09: 4 mg via INTRAVENOUS

## 2011-05-09 SURGICAL SUPPLY — 48 items
APPLIER CLIP 9.375 MED OPEN (MISCELLANEOUS) ×2
BINDER BREAST LRG (GAUZE/BANDAGES/DRESSINGS) IMPLANT
BINDER BREAST XLRG (GAUZE/BANDAGES/DRESSINGS) IMPLANT
BLADE SURG 10 STRL SS (BLADE) ×2 IMPLANT
BLADE SURG 15 STRL LF DISP TIS (BLADE) ×1 IMPLANT
BLADE SURG 15 STRL SS (BLADE) ×1
CANISTER SUCTION 2500CC (MISCELLANEOUS) ×2 IMPLANT
CHLORAPREP W/TINT 26ML (MISCELLANEOUS) ×2 IMPLANT
CLIP APPLIE 9.375 MED OPEN (MISCELLANEOUS) ×1 IMPLANT
CLOTH BEACON ORANGE TIMEOUT ST (SAFETY) ×2 IMPLANT
COVER SURGICAL LIGHT HANDLE (MISCELLANEOUS) ×2 IMPLANT
DERMABOND ADVANCED (GAUZE/BANDAGES/DRESSINGS)
DERMABOND ADVANCED .7 DNX12 (GAUZE/BANDAGES/DRESSINGS) IMPLANT
DEVICE DUBIN SPECIMEN MAMMOGRA (MISCELLANEOUS) ×2 IMPLANT
DRAPE CHEST BREAST 15X10 FENES (DRAPES) ×2 IMPLANT
DRAPE UTILITY 15X26 W/TAPE STR (DRAPE) ×4 IMPLANT
ELECT CAUTERY BLADE 6.4 (BLADE) ×2 IMPLANT
ELECT REM PT RETURN 9FT ADLT (ELECTROSURGICAL) ×2
ELECTRODE REM PT RTRN 9FT ADLT (ELECTROSURGICAL) ×1 IMPLANT
GLOVE BIO SURGEON STRL SZ7.5 (GLOVE) ×2 IMPLANT
GLOVE BIO SURGEON STRL SZ8 (GLOVE) ×2 IMPLANT
GLOVE BIOGEL PI IND STRL 6.5 (GLOVE) ×1 IMPLANT
GLOVE BIOGEL PI IND STRL 8 (GLOVE) ×1 IMPLANT
GLOVE BIOGEL PI INDICATOR 6.5 (GLOVE) ×1
GLOVE BIOGEL PI INDICATOR 8 (GLOVE) ×1
GLOVE ECLIPSE 6.5 STRL STRAW (GLOVE) ×2 IMPLANT
GOWN STRL NON-REIN LRG LVL3 (GOWN DISPOSABLE) ×4 IMPLANT
KIT BASIN OR (CUSTOM PROCEDURE TRAY) ×2 IMPLANT
KIT MARKER MARGIN INK (KITS) ×2 IMPLANT
KIT ROOM TURNOVER OR (KITS) ×2 IMPLANT
NEEDLE 18GX1X1/2 (RX/OR ONLY) (NEEDLE) ×2 IMPLANT
NEEDLE HYPO 25GX1X1/2 BEV (NEEDLE) ×2 IMPLANT
NS IRRIG 1000ML POUR BTL (IV SOLUTION) ×2 IMPLANT
PACK SURGICAL SETUP 50X90 (CUSTOM PROCEDURE TRAY) ×2 IMPLANT
PAD ARMBOARD 7.5X6 YLW CONV (MISCELLANEOUS) ×2 IMPLANT
PENCIL BUTTON HOLSTER BLD 10FT (ELECTRODE) ×2 IMPLANT
SPONGE LAP 18X18 X RAY DECT (DISPOSABLE) ×2 IMPLANT
SUT MON AB 4-0 PC3 18 (SUTURE) ×2 IMPLANT
SUT SILK 2 0 SH (SUTURE) IMPLANT
SUT VIC AB 3-0 SH 27 (SUTURE) ×1
SUT VIC AB 3-0 SH 27XBRD (SUTURE) ×1 IMPLANT
SUT VIC AB 3-0 SH 8-18 (SUTURE) ×2 IMPLANT
SYR BULB 3OZ (MISCELLANEOUS) ×2 IMPLANT
SYR CONTROL 10ML LL (SYRINGE) ×2 IMPLANT
TOWEL OR 17X24 6PK STRL BLUE (TOWEL DISPOSABLE) ×2 IMPLANT
TOWEL OR 17X26 10 PK STRL BLUE (TOWEL DISPOSABLE) ×2 IMPLANT
TUBE CONNECTING 12X1/4 (SUCTIONS) ×2 IMPLANT
YANKAUER SUCT BULB TIP NO VENT (SUCTIONS) ×2 IMPLANT

## 2011-05-09 NOTE — Anesthesia Postprocedure Evaluation (Signed)
  Anesthesia Post-op Note  Patient: Elly M Hird  Procedure(s) Performed: Procedure(s) (LRB): BREAST LUMPECTOMY WITH NEEDLE LOCALIZATION (Left)  Patient Location: PACU  Anesthesia Type: General  Level of Consciousness: awake  Airway and Oxygen Therapy: Patient Spontanous Breathing  Post-op Pain: mild  Post-op Assessment: Post-op Vital signs reviewed, Patient's Cardiovascular Status Stable, Respiratory Function Stable, Patent Airway, No signs of Nausea or vomiting and Pain level controlled  Post-op Vital Signs: stable  Complications: No apparent anesthesia complications 

## 2011-05-09 NOTE — Op Note (Signed)
Breast Lumpectomy Left Needle localization  Indications: This patient presents with history of a left breast DCIS. Given the clinical history and physical exam, along with indicated diagnostic studies, breast lumpectomy will be performed.  Pre-operative Diagnosis: left breast DCIS  Post-operative Diagnosis: left breast DCIS  Surgeon: Harriette Bouillon A.   Assistants: OR staff  Anesthesia: General LMA anesthesia and Local anesthesia 0.25.% bupivacaine, with epinephrine  ASA Class: 2  Procedure Details  The patient was seen in the Holding Room. The risks, benefits, complications, treatment options, and expected outcomes were discussed with the patient. The possibilities of reaction to medication, pulmonary aspiration, bleeding, infection, the need for additional procedures, failure to diagnose a condition, and creating a complication requiring transfusion or operation were discussed with the patient. The patient concurred with the proposed plan, giving informed consent. The site of surgery properly noted/marked. The patient was taken to Operating Room, identified as Kara Diaz, and the procedure verified as lumpectomy. A Time Out was held and the above information confirmed.  After induction of anesthesia, the left breast and chest were prepped and draped in standard fashion. The lumpectomy was performed by creating an oblique incision over the upper central of the breast where the wire exited.  All tissue around the wire was excised and a radiograph showed the clip,  Wire and calcifications  In the film.   Hemostasis was achieved with cautery. The wound was irrigated and the cavity marked with clips. Subcutaneous tissue closed in layers closed with a 3-0 Vicryl.4 o monocryl used for the skin.    Sterile dressings were applied. At the end of the operation, all sponge, instrument, and needle counts were correct.  Findings: grossly clear surgical margins  Estimated Blood Loss:  Minimal       Drains: none         Total IV Fluids: 800 mL         Specimens: breast mass   With clip,  wire and calcifications          Complications:  None; patient tolerated the procedure well.         Disposition: PACU - hemodynamically stable.         Condition: Stable

## 2011-05-09 NOTE — Transfer of Care (Signed)
Immediate Anesthesia Transfer of Care Note  Patient: Kara Diaz  Procedure(s) Performed: Procedure(s) (LRB): BREAST LUMPECTOMY WITH NEEDLE LOCALIZATION (Left)  Patient Location: PACU  Anesthesia Type: General  Level of Consciousness: awake, alert  and oriented  Airway & Oxygen Therapy: Patient Spontanous Breathing and Patient connected to nasal cannula oxygen  Post-op Assessment: Report given to PACU RN and Post -op Vital signs reviewed and stable  Post vital signs: Reviewed and stable  Complications: No apparent anesthesia complications

## 2011-05-09 NOTE — Interval H&P Note (Signed)
History and Physical Interval Note:  05/09/2011 10:01 AM  Kara Diaz  has presented today for surgery, with the diagnosis of left DCIS cancer left breast   The various methods of treatment have been discussed with the patient and family. After consideration of risks, benefits and other options for treatment, the patient has consented to  Procedure(s) (LRB): BREAST LUMPECTOMY WITH NEEDLE LOCALIZATION (Left) as a surgical intervention .  The patients' history has been reviewed, patient examined, no change in status, stable for surgery.  I have reviewed the patients' chart and labs.  Questions were answered to the patient's satisfaction.     [name] A.

## 2011-05-09 NOTE — Anesthesia Postprocedure Evaluation (Signed)
  Anesthesia Post-op Note  Patient: Kara Diaz  Procedure(s) Performed: Procedure(s) (LRB): BREAST LUMPECTOMY WITH NEEDLE LOCALIZATION (Left)  Patient Location: PACU  Anesthesia Type: General  Level of Consciousness: awake  Airway and Oxygen Therapy: Patient Spontanous Breathing  Post-op Pain: mild  Post-op Assessment: Post-op Vital signs reviewed, Patient's Cardiovascular Status Stable, Respiratory Function Stable, Patent Airway, No signs of Nausea or vomiting and Pain level controlled  Post-op Vital Signs: stable  Complications: No apparent anesthesia complications

## 2011-05-09 NOTE — Discharge Instructions (Signed)
[facility] °Office Phone Number [phone] ° °BREAST BIOPSY/ PARTIAL MASTECTOMY: POST OP INSTRUCTIONS ° °Always review your discharge instruction sheet given to you by the facility where your surgery was performed. ° °IF YOU HAVE DISABILITY OR FAMILY LEAVE FORMS, YOU MUST BRING THEM TO THE OFFICE FOR PROCESSING.  DO NOT GIVE THEM TO YOUR DOCTOR. ° °1. A prescription for pain medication may be given to you upon discharge.  Take your pain medication as prescribed, if needed.  If narcotic pain medicine is not needed, then you may take acetaminophen (Tylenol) or ibuprofen (Advil) as needed. °2. Take your usually prescribed medications unless otherwise directed °3. If you need a refill on your pain medication, please contact your pharmacy.  They will contact our office to request authorization.  Prescriptions will not be filled after 5pm or on week-ends. °4. You should eat very light the first 24 hours after surgery, such as soup, crackers, pudding, etc.  Resume your normal diet the day after surgery. °5. Most patients will experience some swelling and bruising in the breast.  Ice packs and a good support bra will help.  Swelling and bruising can take several days to resolve.  °6. It is common to experience some constipation if taking pain medication after surgery.  Increasing fluid intake and taking a stool softener will usually help or prevent this problem from occurring.  A mild laxative (Milk of Magnesia or Miralax) should be taken according to package directions if there are no bowel movements after 48 hours. °7. Unless discharge instructions indicate otherwise, you may remove your bandages 24-48 hours after surgery, and you may shower at that time.  You may have steri-strips (small skin tapes) in place directly over the incision.  These strips should be left on the skin for 7-10 days.  If your surgeon used skin glue on the incision, you may shower in 24 hours.  The glue will flake off over the  next 2-3 weeks.  Any sutures or staples will be removed at the office during your follow-up visit. °8. ACTIVITIES:  You may resume regular daily activities (gradually increasing) beginning the next day.  Wearing a good support bra or sports bra minimizes pain and swelling.  You may have sexual intercourse when it is comfortable. °a. You may drive when you no longer are taking prescription pain medication, you can comfortably wear a seatbelt, and you can safely maneuver your car and apply brakes. °b. RETURN TO WORK:  ______________________________________________________________________________________ °9. You should see your doctor in the office for a follow-up appointment approximately two weeks after your surgery.  Your doctor’s nurse will typically make your follow-up appointment when she calls you with your pathology report.  Expect your pathology report 2-3 business days after your surgery.  You may call to check if you do not hear from us after three days. °10. OTHER INSTRUCTIONS: _______________________________________________________________________________________________ _____________________________________________________________________________________________________________________________________ °_____________________________________________________________________________________________________________________________________ °_____________________________________________________________________________________________________________________________________ ° °WHEN TO CALL YOUR DOCTOR: °1. Fever over 101.0 °2. Nausea and/or vomiting. °3. Extreme swelling or bruising. °4. Continued bleeding from incision. °5. Increased pain, redness, or drainage from the incision. ° °The clinic staff is available to answer your questions during regular business hours.  Please don’t hesitate to call and ask to speak to one of the nurses for clinical concerns.  If you have a medical emergency, go to the nearest  emergency room or call 911.  A surgeon from [facility] is always on call at the hospital. ° °For further questions, please visit [email or web address]  °

## 2011-05-09 NOTE — H&P (Signed)
Kara Diaz    MRN: [id number]   Description: 71 year old female  Provider: Dortha Schwalbe., MD  Department: [facility]        Diagnoses     DCIS (ductal carcinoma in situ)   - Primary    233.0    left      DCIS (ductal carcinoma in situ)     233.0           Progress Notes      Patient ID: Kara Diaz, female   DOB: 1940-02-05, 71 y.o.   MRN: [id number]   No chief complaint on file.   HPI Kara Diaz is a 71 y.o. female.  Pt presents at the request of Dr August Saucer due to abnormal mammogram and left breast microcalcifications biopsy proven DCIS  Measuring 2 cm in central upper breast.  She denies mass,  Pain or discharge. HPI    Past Medical History   Diagnosis  Date   .  Hypertension     .  Angina         Past Surgical History   Procedure  Date   .  Cholecystectomy     .  Abdominal hysterectomy     .  Rotator cuff surgery        No family history on file.   Social History History   Substance Use Topics   .  Smoking status:  Never Smoker    .  Smokeless tobacco:  Never Used   .  Alcohol Use:  Yes      No Known Allergies    Current Outpatient Prescriptions   Medication  Sig  Dispense  Refill   .  amLODipine-benazepril (LOTREL) 10-20 MG per capsule  Take 1 capsule by mouth daily.         .  nitroGLYCERIN (NITROSTAT) 0.4 MG SL tablet  Place 0.4 mg under the tongue every 5 (five) minutes as needed. Take as directed         .  potassium chloride (K-DUR) 10 MEQ tablet  Take 20 mEq by mouth 2 (two) times daily.         Marland Kitchen  triamterene-hydrochlorothiazide (DYAZIDE) 37.5-25 MG per capsule  Take 1 capsule by mouth every morning.            Review of Systems Review of Systems  Constitutional: Negative for fever, chills and unexpected weight change.  HENT: Negative for hearing loss, congestion, sore throat, trouble swallowing and voice change.   Eyes: Negative for visual disturbance.  Respiratory: Negative for cough and wheezing.     Cardiovascular: Negative for chest pain, palpitations and leg swelling.  Gastrointestinal: Negative for nausea, vomiting, abdominal pain, diarrhea, constipation, blood in stool, abdominal distention and anal bleeding.  Genitourinary: Negative for hematuria, vaginal bleeding and difficulty urinating.  Musculoskeletal: Negative for arthralgias.  Skin: Negative for rash and wound.  Neurological: Negative for seizures, syncope and headaches.  Hematological: Negative for adenopathy. Does not bruise/bleed easily.  Psychiatric/Behavioral: Negative for confusion.    T 98  HR 70  BP STABLE   Physical Exam Physical Exam  Constitutional: She appears well-developed and well-nourished.  HENT:   Head: Normocephalic and atraumatic.  Eyes: EOM are normal. Pupils are equal, round, and reactive to light.  Neck: Normal range of motion. Neck supple.  Cardiovascular: Normal rate and regular rhythm.   Pulmonary/Chest: Effort normal and breath sounds normal.       Large pedulous breasts bilateral.  Small hematoma  left breast central upper portion.  Right breast normal.  Bilateral axilla normal  Abdominal: Soft. Bowel sounds are normal.  Musculoskeletal: Normal range of motion.  Neurological: She is alert.  Skin: Skin is warm and dry.  Psychiatric: She has a normal mood and affect. Her behavior is normal. Judgment and thought content normal.    Data Reviewed Mammogram and MRI show 2 cm cluster of micro calcifications central upper breast.    Assessment Left breast DCIS   Plan Pt desires left breast lumpectomy with needle localization.The procedure has been discussed with the patient. Alternatives to surgery have been discussed with the patient.  Risks of surgery include bleeding,  Infection,  Seroma formation, death,  and the need for further surgery.   The patient understands and wishes to proceed.       Faisal Stradling A. 05/09/2011

## 2011-05-09 NOTE — Anesthesia Procedure Notes (Signed)
Procedure Name: LMA Insertion Date/Time: 05/09/2011 10:30 AM Performed by: Arlice Colt B Pre-anesthesia Checklist: Patient identified, Emergency Drugs available, Suction available, Patient being monitored and Timeout performed Patient Re-evaluated:Patient Re-evaluated prior to inductionOxygen Delivery Method: Circle system utilized Preoxygenation: Pre-oxygenation with 100% oxygen Intubation Type: IV induction LMA: LMA inserted LMA Size: 5.0 Number of attempts: 1 Placement Confirmation: positive ETCO2 and breath sounds checked- equal and bilateral Tube secured with: Tape Dental Injury: Teeth and Oropharynx as per pre-operative assessment

## 2011-05-09 NOTE — Addendum Note (Signed)
Addendum  created 05/09/11 1239 by Bedelia Person, MD   Modules edited:Notes Section

## 2011-05-09 NOTE — Anesthesia Preprocedure Evaluation (Signed)
Anesthesia Evaluation  Patient identified by MRN, date of birth, ID band Patient awake    Reviewed: Allergy & Precautions, H&P , NPO status , Patient's Chart, lab work & pertinent test results  History of Anesthesia Complications (+) PONV  Airway Mallampati: I TM Distance: >3 FB Neck ROM: Full    Dental   Pulmonary    Pulmonary exam normal       Cardiovascular hypertension,     Neuro/Psych    GI/Hepatic   Endo/Other    Renal/GU      Musculoskeletal   Abdominal (+) + obese,   Peds  Hematology   Anesthesia Other Findings   Reproductive/Obstetrics                           Anesthesia Physical Anesthesia Plan  ASA: II  Anesthesia Plan: General   Post-op Pain Management:    Induction: Intravenous  Airway Management Planned: LMA  Additional Equipment:   Intra-op Plan:   Post-operative Plan: Extubation in OR  Informed Consent: I have reviewed the patients History and Physical, chart, labs and discussed the procedure including the risks, benefits and alternatives for the proposed anesthesia with the patient or authorized representative who has indicated his/her understanding and acceptance.     Plan Discussed with: CRNA and Surgeon  Anesthesia Plan Comments:         Anesthesia Quick Evaluation

## 2011-05-10 ENCOUNTER — Ambulatory Visit: Payer: Medicare Other | Admitting: Oncology

## 2011-05-10 ENCOUNTER — Other Ambulatory Visit: Payer: Medicare Other | Admitting: Lab

## 2011-05-11 ENCOUNTER — Telehealth (INDEPENDENT_AMBULATORY_CARE_PROVIDER_SITE_OTHER): Payer: Self-pay

## 2011-05-11 NOTE — Progress Notes (Signed)
Results given to patient

## 2011-05-11 NOTE — Telephone Encounter (Signed)
Called patient with pathology results, given date & time for follow up appointment with Dr. Luisa Hart.

## 2011-05-13 ENCOUNTER — Ambulatory Visit (HOSPITAL_BASED_OUTPATIENT_CLINIC_OR_DEPARTMENT_OTHER): Payer: Medicare Other | Admitting: Oncology

## 2011-05-13 ENCOUNTER — Other Ambulatory Visit: Payer: Self-pay | Admitting: *Deleted

## 2011-05-13 ENCOUNTER — Encounter: Payer: Self-pay | Admitting: Oncology

## 2011-05-13 ENCOUNTER — Telehealth: Payer: Self-pay | Admitting: Oncology

## 2011-05-13 ENCOUNTER — Other Ambulatory Visit (HOSPITAL_BASED_OUTPATIENT_CLINIC_OR_DEPARTMENT_OTHER): Payer: Medicare Other | Admitting: Lab

## 2011-05-13 VITALS — BP 173/93 | HR 78 | Temp 98.2°F | Ht 64.0 in | Wt 207.3 lb

## 2011-05-13 DIAGNOSIS — D0590 Unspecified type of carcinoma in situ of unspecified breast: Secondary | ICD-10-CM

## 2011-05-13 DIAGNOSIS — D0510 Intraductal carcinoma in situ of unspecified breast: Secondary | ICD-10-CM

## 2011-05-13 LAB — COMPREHENSIVE METABOLIC PANEL
ALT: 22 U/L (ref 0–35)
AST: 31 U/L (ref 0–37)
Alkaline Phosphatase: 91 U/L (ref 39–117)
BUN: 18 mg/dL (ref 6–23)
Creatinine, Ser: 0.55 mg/dL (ref 0.50–1.10)
Potassium: 3.8 mEq/L (ref 3.5–5.3)

## 2011-05-13 LAB — CBC WITH DIFFERENTIAL/PLATELET
BASO%: 0.7 % (ref 0.0–2.0)
EOS%: 1.8 % (ref 0.0–7.0)
HCT: 38.1 % (ref 34.8–46.6)
LYMPH%: 35.8 % (ref 14.0–49.7)
MCH: 32 pg (ref 25.1–34.0)
MCHC: 33.4 g/dL (ref 31.5–36.0)
MONO#: 0.3 10*3/uL (ref 0.1–0.9)
NEUT%: 54.3 % (ref 38.4–76.8)
RBC: 3.99 10*6/uL (ref 3.70–5.45)
WBC: 4.4 10*3/uL (ref 3.9–10.3)
lymph#: 1.6 10*3/uL (ref 0.9–3.3)
nRBC: 0 % (ref 0–0)

## 2011-05-13 NOTE — Patient Instructions (Signed)
1. I will see you back in 6 after completion of radiation.

## 2011-05-13 NOTE — Telephone Encounter (Signed)
gve the pt her July 2013 appt calendar °

## 2011-05-13 NOTE — Progress Notes (Signed)
OFFICE PROGRESS NOTE  CC  DEAN, ERIC, MD, MD [address], [address] [facility] [place] [address] Dr. Harriette Bouillon Dr. 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 The final pathology revealed high-grade DCIS measuring 0.5 cm. The tumor was ER +80% PR +70%.  #2 patient will begin radiation therapy to be given by Dr. Lurline Hare. Once she completes this then we would consider antiestrogen therapy as chemoprevention.  CURRENT THERAPY:Patient for reevaluation by radiation oncology  INTERVAL HISTORY: Kara Diaz 71 y.o. female returns for Followup visit. Clinically she seems to be doing well she tolerated the surgery well without any complaints. Today she denies any fevers chills night sweats headaches shortness of breath chest pains palpitations she has no myalgias or arthralgias. Denies any hematuria hematochezia. No weakness no bleeding. Remainder of the 10 point review of systems is negative.  MEDICAL HISTORY: Past Medical History  Diagnosis Date  . Angina   . PONV (postoperative nausea and vomiting)   . Hypertension     takes Dyazide and Lotrel daily  . Hyperlipidemia     doesn't take any meds for this  . Pneumonia     hx of in 1992  . Joint pain   . Joint swelling   . Arthritis     all over  . Hemorrhoids   . Cancer     left breast DCIS  . Urinary frequency   . Early cataracts, bilateral   . Insomnia     but doesn't take anything for this    ALLERGIES:   has no known allergies.  MEDICATIONS:  Current Outpatient Prescriptions  Medication Sig Dispense Refill  . amLODipine-benazepril (LOTREL) 10-20 MG per capsule Take 1 capsule by mouth daily.      Marland Kitchen aspirin EC 81 MG tablet Take 81 mg by mouth daily.      Marland Kitchen b complex vitamins tablet Take 1 tablet by mouth daily.      . Calcium  600-200 MG-UNIT per tablet Take 1 tablet by mouth daily.      . Cholecalciferol (VITAMIN D3) 2000 UNITS TABS Take 1 tablet by mouth daily.      . Cobalamine Combinations (FOLIC + B12 PO) Take by mouth.      . cycloSPORINE (RESTASIS) 0.05 % ophthalmic emulsion Place 1 drop into both eyes 2 (two) times daily.      Marland Kitchen GLUCOSAMINE-CHONDROITIN PO Take 5 capsules by mouth daily.      Marland Kitchen HYDROcodone-acetaminophen (NORCO) 10-325 MG per tablet Take 1 tablet by mouth every 4 (four) hours as needed for pain.  30 tablet  0  . Magnesium 400 MG CAPS Take 1 tablet by mouth daily.      . Multiple Vitamins-Minerals (MULTIVITAMINS THER. W/MINERALS) TABS Take 1 tablet by mouth daily.      . nitroGLYCERIN (NITROSTAT) 0.4 MG SL tablet Place 0.4 mg under the tongue every 5 (five) minutes as needed. For chest pain.      . Omega-3 Fatty Acids (FISH OIL) 1000 MG CAPS Take 1 capsule by mouth daily.      Marland Kitchen OVER THE COUNTER MEDICATION Take 2 tablets by mouth daily. Circulation and vein support for healthy legs.      . potassium chloride SA (K-DUR,KLOR-CON) 20 MEQ tablet Take 20 mEq by mouth daily.      Marland Kitchen triamterene-hydrochlorothiazide (  DYAZIDE) 37.5-25 MG per capsule Take 1 capsule by mouth every morning.      . vitamin C (ASCORBIC ACID) 500 MG tablet Take 500 mg by mouth daily.        SURGICAL HISTORY:  Past Surgical History  Procedure Date  . Rotator cuff surgery 1998    right  . Tonsillectomy     as a child  . Myomectomy     early 18's  . Abdominal hysterectomy 1980  . Cholecystectomy late 90's  . Cardiac catheterization 12/16/10  . Colonoscopy     REVIEW OF SYSTEMS:  Pertinent items are noted in HPI.   PHYSICAL EXAMINATION: General appearance: alert, cooperative and appears stated age Lymph nodes: Cervical, supraclavicular, and axillary nodes normal. Resp: clear to auscultation bilaterally and normal percussion bilaterally Back: symmetric, no curvature. ROM normal. No CVA tenderness. Cardio: regular rate  and rhythm, S1, S2 normal, no murmur, click, rub or gallop GI: soft, non-tender; bowel sounds normal; no masses,  no organomegaly Extremities: extremities normal, atraumatic, no cyanosis or edema Neurologic: Grossly normal  ECOG PERFORMANCE STATUS: 1 - Symptomatic but completely ambulatory  Blood pressure 173/93, pulse 78, temperature 98.2 F (36.8 C), temperature source Oral, height 5\' 4"  (1.626 m), weight 207 lb 4.8 oz (94.031 kg).  LABORATORY DATA: Lab Results  Component Value Date   WBC 4.4 05/13/2011   HGB 12.7 05/13/2011   HCT 38.1 05/13/2011   MCV 95.6 05/13/2011   PLT 331 05/13/2011      Chemistry      Component Value Date/Time   NA 142 05/02/2011 1100   K 3.0* 05/02/2011 1100   CL 101 05/02/2011 1100   CO2 32 05/02/2011 1100   BUN 19 05/02/2011 1100   CREATININE 0.55 05/02/2011 1100      Component Value Date/Time   CALCIUM 10.2 05/02/2011 1100   ALKPHOS 84 05/02/2011 1100   AST 17 05/02/2011 1100   ALT 12 05/02/2011 1100   BILITOT 0.7 05/02/2011 1100     Diagnosis Breast, lumpectomy, Left - RESIDUAL INTERMEDIATE TO HIGH GRADE DCIS, APPROXIMATELY 0.5 CM IN DIMENSION, STATUS POST BIOPSY. - SEE COMMENT. Microscopic Comment BREAST, IN SITU CARCINOMA Specimen, including laterality: Left breast. Procedure: Excision with wire guided localization. Grade of carcinoma: Intermediate to high grade. Necrosis: Present. Estimated tumor size: (gross measurement): 0.5 cm Treatment effect: N/A Distance to closest margin: Intermediate grade DCIS comes to within 0.4 cm of inferior margin (closest margin). Remaining margins 1 cm or greater. Breast prognostic profile: Estrogen receptor: 8% ([id number]) Progesterone receptor: 7% ([id number]) Lymph nodes: None received. TNM: pTis, pNX Comments: Sections show a stellate fibrotic scar area containing foreign material consistent with a previous biopsy site. At the periphery of the scar, there are multiple foci of intermediate to high grade DCIS  with focal necrosis. The DCIS is present in four of five paraffin blocks representing the entirety of the scar/biopsy site and spans a distance of approximately 0.5 cm (estimated gross measurement). Additional clinical correlation with radiographic studies is recommended for the estimation of the lesional size. ER/PR studies were performed on the recent biopsy specimen ([id number]). (RM:gt, 05/10/11) Lloyd Huger M.D. Ph.D. Pathologist, Electronic Signature (Case signed 05/10/2011) Specimen Gross and Clinical Information 1 of  ASSESSMENT: 39-year-old female with high-grade DCIS measuring about 0.5 cm. She is now status post lumpectomy. Postoperatively she is doing well. And is without any complaints.   PLAN: She will be referred to Dr. Lurline Hare for consideration of radiation therapy. Once she completes that  then we will plan on starting her on antiestrogen therapy with Aromasin 25 mg daily.   All questions were answered. The patient knows to call the clinic with any problems, questions or concerns. We can certainly see the patient much sooner if necessary.  I spent 25 minutes counseling the patient face to face. The total time spent in the appointment was 30 minutes.    Drue Second, MD Medical/Oncology [facility] [phone] (beeper) [phone] (Office)  05/13/2011, 2:48 PM

## 2011-05-23 ENCOUNTER — Encounter: Payer: Self-pay | Admitting: Radiation Oncology

## 2011-05-23 NOTE — Progress Notes (Signed)
71 year old female.    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 S/P lumpectomy. Referred to Dr. Michell Heinrich from Dr. Welton Flakes for consideration of radiation therapy. Will begin antiestrogen therapy, Aromasin 25 mg daily after completion of radiation therapy.    NKDA No indication of a pacemaker No hx of radiation therapy

## 2011-05-25 ENCOUNTER — Ambulatory Visit
Admission: RE | Admit: 2011-05-25 | Discharge: 2011-05-25 | Disposition: A | Discharge status: Home / Self Care | Payer: Medicare Other | Source: Ambulatory Visit | Attending: Radiation Oncology | Admitting: Radiation Oncology

## 2011-05-25 ENCOUNTER — Encounter: Payer: Self-pay | Admitting: Radiation Oncology

## 2011-05-25 VITALS — BP 151/87 | HR 71 | Temp 97.3°F | Resp 18 | Ht 64.0 in | Wt 206.7 lb

## 2011-05-25 DIAGNOSIS — Z510 Encounter for antineoplastic radiation therapy: Secondary | ICD-10-CM | POA: Insufficient documentation

## 2011-05-25 DIAGNOSIS — L988 Other specified disorders of the skin and subcutaneous tissue: Secondary | ICD-10-CM | POA: Diagnosis not present

## 2011-05-25 DIAGNOSIS — D0590 Unspecified type of carcinoma in situ of unspecified breast: Secondary | ICD-10-CM | POA: Diagnosis not present

## 2011-05-25 DIAGNOSIS — D0510 Intraductal carcinoma in situ of unspecified breast: Secondary | ICD-10-CM

## 2011-05-25 DIAGNOSIS — Z79899 Other long term (current) drug therapy: Secondary | ICD-10-CM | POA: Diagnosis not present

## 2011-05-25 DIAGNOSIS — C50919 Malignant neoplasm of unspecified site of unspecified female breast: Secondary | ICD-10-CM | POA: Diagnosis not present

## 2011-05-25 NOTE — Progress Notes (Signed)
Please see progress note under physician encounter 

## 2011-05-25 NOTE — Progress Notes (Signed)
Complete PATIENT MEASURE OF DISTRESS worksheet with a score of 0 submitted to social work. Also, complete NUTRITION RISK SCREEN submitted to Zenovia Jarred, RD. Patient reports that since October 2012 she has been stressing eating because of recent diagnosis and has gained 25 pounds back of the 100 she lost.

## 2011-05-25 NOTE — Progress Notes (Signed)
Patient presents to the clinic today unaccompanied for a new consultation with Dr. Michell Heinrich reference left breast DCIS. Patient is alert and oriented to person, place, and time. No distress noted. Steady gait noted. Pleasant affect noted. Patient denies pain related to her breast but reports continued bilateral knee pain related to effects of arthritis. Patient has a right knee replacement scheduled for 07/25/11. Patient reports that she worked to lose over 100 pounds then learned "something was abnormal in her breast in October, chose to put off biopsy, and began to stress eating" and gained 25 pounds. Patient reports that he received her confirmed cancer diagnosis on March 18th. Patient moved here from [place] with her brother who was once a doctor here at [facility]. Patient scheduled to follow up with Dr. Mignon Pine tomorrow morning for a post op recheck. Patient denies nausea, vomiting, headache, dizziness, double vision, and diarrhea. Patient reports continued difficulty sleeping and reports on average that she only gets 2-3 hours of sleep per night. Reported all findings to Dr. Michell Heinrich.      *Eligible for study

## 2011-05-26 ENCOUNTER — Telehealth: Payer: Self-pay | Admitting: Oncology

## 2011-05-26 ENCOUNTER — Encounter (INDEPENDENT_AMBULATORY_CARE_PROVIDER_SITE_OTHER): Payer: Self-pay | Admitting: Surgery

## 2011-05-26 ENCOUNTER — Ambulatory Visit
Admission: RE | Admit: 2011-05-26 | Discharge: 2011-05-26 | Disposition: A | Discharge status: Home / Self Care | Payer: Medicare Other | Source: Ambulatory Visit | Attending: Radiation Oncology | Admitting: Radiation Oncology

## 2011-05-26 ENCOUNTER — Telehealth: Payer: Self-pay | Admitting: Radiation Oncology

## 2011-05-26 ENCOUNTER — Ambulatory Visit (INDEPENDENT_AMBULATORY_CARE_PROVIDER_SITE_OTHER): Payer: Medicare Other | Admitting: Surgery

## 2011-05-26 ENCOUNTER — Encounter: Payer: Self-pay | Admitting: Radiation Oncology

## 2011-05-26 VITALS — BP 128/76 | HR 80 | Temp 97.8°F | Resp 14 | Ht 64.0 in | Wt 207.4 lb

## 2011-05-26 DIAGNOSIS — L988 Other specified disorders of the skin and subcutaneous tissue: Secondary | ICD-10-CM | POA: Diagnosis not present

## 2011-05-26 DIAGNOSIS — Z79899 Other long term (current) drug therapy: Secondary | ICD-10-CM | POA: Diagnosis not present

## 2011-05-26 DIAGNOSIS — Z9889 Other specified postprocedural states: Secondary | ICD-10-CM

## 2011-05-26 DIAGNOSIS — D0590 Unspecified type of carcinoma in situ of unspecified breast: Secondary | ICD-10-CM | POA: Diagnosis not present

## 2011-05-26 DIAGNOSIS — D0510 Intraductal carcinoma in situ of unspecified breast: Secondary | ICD-10-CM

## 2011-05-26 DIAGNOSIS — Z510 Encounter for antineoplastic radiation therapy: Secondary | ICD-10-CM | POA: Diagnosis not present

## 2011-05-26 DIAGNOSIS — C50919 Malignant neoplasm of unspecified site of unspecified female breast: Secondary | ICD-10-CM | POA: Diagnosis not present

## 2011-05-26 NOTE — Progress Notes (Signed)
Name: Kara Diaz   MRN: [id number]  Date:  05/26/2011  DOB: [birth year]  Status:outpatient    DIAGNOSIS: Breast cancer.  CONSENT VERIFIED: yes   SET UP: Patient is setup supine   IMMOBILIZATION:  The following immobilization was used:Custom Moldable Pillow, breast board.   NARRATIVE: [name] was brought to the CT Simulation planning suite.  Identity was confirmed.  All relevant records and images related to the planned course of therapy were reviewed.  Then, the patient was positioned in a stable reproducible clinical set-up for radiation therapy on the prone breast board.  Wires were placed to delineate the clinical extent of breast tissue. A wire was placed on the scar as well.  CT images were obtained.  An isocenter was placed. Skin markings were placed.  The CT images were loaded into the planning software where the target and avoidance structures were contoured.  The radiation prescription was entered and confirmed. The patient was discharged in stable condition and tolerated simulation well.    TREATMENT PLANNING NOTE:  Treatment planning then occurred. I have requested : MLC's, isodose plan, basic dose calculation

## 2011-05-26 NOTE — Progress Notes (Signed)
Met with patient to discuss RO billing.  Patient had no concerns today. 

## 2011-05-26 NOTE — Telephone Encounter (Signed)
test

## 2011-05-26 NOTE — Patient Instructions (Signed)
Follow-up 6 months

## 2011-05-26 NOTE — Progress Notes (Signed)
Kara Diaz    [id number] 05/26/2011    [birth year]   CC: Post op left breast lumpectomy  HPI: The patient returns for post op follow-up. She underwent a  LEFT  LUMPECTOMY on 05/09/11.  Path shows DCIS 5 mm margins greater than 1 cm. Over all she feels that she is doing well.   PE: The incision is healing nicely and there is no evidence of infection or hematoma.    DATA REVIEWED: Pathology report showed DCIS  Intermediate grade   Weakly ER PR pos.   Margins clear  IMPRESSION: Patient doing well.   PLAN: Her next visit will be in 6 months.

## 2011-05-26 NOTE — Telephone Encounter (Signed)
Kara Diaz nurse called for the pt to r/s her July appt due to she will be having knee surgery.

## 2011-05-26 NOTE — Progress Notes (Signed)
Radiation Oncology         [phone] ________________________________  Name: Kara Diaz MRN: [id number]  Date: 05/25/2011  DOB: May 27, 1940  Follow-Up Visit Note  CC: Kara Blade, MD, MD  Cornett, Clovis Pu., MD  Diagnosis:   DCIS of the left breast  Narrative:  The patient returns today for routine follow-up.  She tolerated her surgery well. She has recovered well and is ready for radiation. She had low ER and PR positivity at 8 and 7% respectively. Her main concern is fitting her treatment in before her knee replacement surgery.                              ALLERGIES:   has no known allergies.  Meds: Current Outpatient Prescriptions  Medication Sig Dispense Refill  . amLODipine-benazepril (LOTREL) 10-20 MG per capsule Take 1 capsule by mouth daily.      Marland Kitchen aspirin EC 81 MG tablet Take 81 mg by mouth daily.      Marland Kitchen b complex vitamins tablet Take 1 tablet by mouth daily.      . Calcium 600-200 MG-UNIT per tablet Take 1 tablet by mouth daily.      . Cholecalciferol (VITAMIN D3) 2000 UNITS TABS Take 1 tablet by mouth daily.      . Cobalamine Combinations (FOLIC + B12 PO) Take by mouth.      . cycloSPORINE (RESTASIS) 0.05 % ophthalmic emulsion Place 1 drop into both eyes 2 (two) times daily.      Marland Kitchen GLUCOSAMINE-CHONDROITIN PO Take 5 capsules by mouth daily.      . Magnesium 400 MG CAPS Take 1 tablet by mouth daily.      . Multiple Vitamins-Minerals (MULTIVITAMINS THER. W/MINERALS) TABS Take 1 tablet by mouth daily.      . Omega-3 Fatty Acids (FISH OIL) 1000 MG CAPS Take 1 capsule by mouth daily.      Marland Kitchen OVER THE COUNTER MEDICATION Take 2 tablets by mouth daily. Circulation and vein support for healthy legs.      . potassium chloride SA (K-DUR,KLOR-CON) 20 MEQ tablet Take 20 mEq by mouth daily.      Marland Kitchen triamterene-hydrochlorothiazide (DYAZIDE) 37.5-25 MG per capsule Take 1 capsule by mouth every morning.      . vitamin C (ASCORBIC ACID) 500 MG tablet Take 500 mg by mouth daily.        Marland Kitchen HYDROcodone-acetaminophen (NORCO) 10-325 MG per tablet Take 1 tablet by mouth every 4 (four) hours as needed for pain.  30 tablet  0  . nitroGLYCERIN (NITROSTAT) 0.4 MG SL tablet Place 0.4 mg under the tongue every 5 (five) minutes as needed. For chest pain.        Physical Findings: The patient is in no acute distress. Patient is alert and oriented. . she has a well-healed scar on her left breast.  height is 5\' 4"  (1.626 m) and weight is 206 lb 11.2 oz (93.759 kg). Her oral temperature is 97.3 F (36.3 C). Her blood pressure is 151/87 and her pulse is 71. Her respiration is 18. .  No significant changes.  Lab Findings: Lab Results  Component Value Date   WBC 4.4 05/13/2011   HGB 12.7 05/13/2011   HCT 38.1 05/13/2011   MCV 95.6 05/13/2011   PLT 331 05/13/2011      Radiographic Findings: Chest 2 View  05/02/2011  *RADIOLOGY REPORT*  Clinical Data: Preop radiograph.  Left lumpectomy.  CHEST - 2 VIEW  Comparison: 12/01/2010  Findings: Heart size and mediastinal contours are normal.  No pleural effusion or pulmonary edema noted.  No airspace consolidation identified.  Scoliosis deformity is noted involving the thoracic spine which is convex to the right.  IMPRESSION:  1.  No active cardiopulmonary abnormalities noted.  Original Report Authenticated By: Rosealee Albee, M.D.   Mm Breast Surgical Specimen  05/09/2011  *RADIOLOGY REPORT*  Clinical Data:  Biopsy-proven left breast DCIS  NEEDLE LOCALIZATION WITH MAMMOGRAPHIC GUIDANCE AND SPECIMEN RADIOGRAPH  Patient presents for needle localization prior to surgery.  I met with the patient and we discussed the procedure of needle localization including benefits and alternatives. We discussed the high likelihood of a successful procedure. We discussed the risks of the procedure, including infection, bleeding, tissue injury, and further surgery. Informed, written consent was given.  Using mammographic guidance, sterile technique, 2% lidocaine and a #7  modified Kopans needle, the calcifications in the upper outer quadrant of the left breast were localized using a superior approach.  The films are marked for Dr. Luisa Hart.  Specimen radiograph was performed at day surgery, and confirms the calcifications and biopsy clip were present in the tissue sample. The specimen is marked for pathology.  IMPRESSION: Needle localization of the left breast.  No apparent complications.  Original Report Authenticated By: Littie Deeds. Judyann Munson, M.D.   Mm Breast Wire Localization Left  05/09/2011  *RADIOLOGY REPORT*  Clinical Data:  Biopsy-proven left breast DCIS  NEEDLE LOCALIZATION WITH MAMMOGRAPHIC GUIDANCE AND SPECIMEN RADIOGRAPH  Patient presents for needle localization prior to surgery.  I met with the patient and we discussed the procedure of needle localization including benefits and alternatives. We discussed the high likelihood of a successful procedure. We discussed the risks of the procedure, including infection, bleeding, tissue injury, and further surgery. Informed, written consent was given.  Using mammographic guidance, sterile technique, 2% lidocaine and a #7 modified Kopans needle, the calcifications in the upper outer quadrant of the left breast were localized using a superior approach.  The films are marked for Dr. Luisa Hart.  Specimen radiograph was performed at day surgery, and confirms the calcifications and biopsy clip were present in the tissue sample. The specimen is marked for pathology.  IMPRESSION: Needle localization of the left breast.  No apparent complications.  Original Report Authenticated By: Littie Deeds. Judyann Munson, M.D.    Impression:  DCIS of the left breast status post lumpectomy with negative margins  Plan:  I discussed with [name] the role of radiation and decreasing local failure. We discussed that radiation was a local only treatment. I believe she would benefit in particular because of her low estrogen positivity. She is interested in proceeding on  with radiation. We discussed treatment in the prone breast position to help with inframammary fold skin toxicity as well as for cardiac sparing. She has signed informed consent and agree to proceed forward. She does have a friend is wearing recently diagnosed at [facility] with an endometrial cancer and I said we would be happy to see her here if necessary. I was able to schedule her for simulation later the same day. She'll plan on starting treatment next week. Because of her margin negative resection and her age I will treat her to 50 gray to the whole breast and not with a boost. This should allow her 3-4 weeks to heal up before her knee surgery as scheduled. She did have a followup appointment scheduled Dr. Welton Flakes in July. She  asked me to move that to August as she does not feel she will be totally healed up from her knee surgery at that time.  _____________________________________

## 2011-05-27 ENCOUNTER — Telehealth: Payer: Self-pay | Admitting: Radiation Oncology

## 2011-05-27 NOTE — Telephone Encounter (Signed)
Patient expressed during her consultation visit with Dr. Michell Heinrich on Wednesday, May 15 th she needed to reschedule her follow up appointment with Dr. Welton Flakes because of her impending knee surgery. Spoke with Crystal ext [phone], Dr. Milta Deiters scheduler, and moved appointment to August 23rd at 1000. Phoned patient yesterday to inform her these things were done, got no answer, and left message. This Clinical research associate has not received a return call. Therefore, phoned her work number this morning but, got no answer and no option to leave a message. Called home number again and left message with new appointment date and time. Routed this message to Dr. Michell Heinrich.

## 2011-05-27 NOTE — Telephone Encounter (Signed)
Patient returned call and confirmed she received message reference new appointment date with Dr. Welton Flakes.

## 2011-05-27 NOTE — Progress Notes (Signed)
Encounter addended by: Agnes Lawrence, RN on: 05/27/2011 12:05 PM<BR>     Documentation filed: Charges VN, Inpatient Patient Education

## 2011-06-01 DIAGNOSIS — L988 Other specified disorders of the skin and subcutaneous tissue: Secondary | ICD-10-CM | POA: Diagnosis not present

## 2011-06-01 DIAGNOSIS — Z79899 Other long term (current) drug therapy: Secondary | ICD-10-CM | POA: Diagnosis not present

## 2011-06-01 DIAGNOSIS — Z510 Encounter for antineoplastic radiation therapy: Secondary | ICD-10-CM | POA: Diagnosis not present

## 2011-06-01 DIAGNOSIS — D0590 Unspecified type of carcinoma in situ of unspecified breast: Secondary | ICD-10-CM | POA: Diagnosis not present

## 2011-06-02 DIAGNOSIS — C50919 Malignant neoplasm of unspecified site of unspecified female breast: Secondary | ICD-10-CM | POA: Diagnosis not present

## 2011-06-02 DIAGNOSIS — Z510 Encounter for antineoplastic radiation therapy: Secondary | ICD-10-CM | POA: Diagnosis not present

## 2011-06-02 DIAGNOSIS — Z79899 Other long term (current) drug therapy: Secondary | ICD-10-CM | POA: Diagnosis not present

## 2011-06-02 DIAGNOSIS — D0590 Unspecified type of carcinoma in situ of unspecified breast: Secondary | ICD-10-CM | POA: Diagnosis not present

## 2011-06-02 DIAGNOSIS — L988 Other specified disorders of the skin and subcutaneous tissue: Secondary | ICD-10-CM | POA: Diagnosis not present

## 2011-06-03 ENCOUNTER — Encounter: Payer: Self-pay | Admitting: Radiation Oncology

## 2011-06-03 ENCOUNTER — Ambulatory Visit
Admission: RE | Admit: 2011-06-03 | Discharge: 2011-06-03 | Disposition: A | Discharge status: Home / Self Care | Payer: Medicare Other | Source: Ambulatory Visit | Attending: Radiation Oncology | Admitting: Radiation Oncology

## 2011-06-03 DIAGNOSIS — Z510 Encounter for antineoplastic radiation therapy: Secondary | ICD-10-CM | POA: Diagnosis not present

## 2011-06-03 DIAGNOSIS — Z79899 Other long term (current) drug therapy: Secondary | ICD-10-CM | POA: Diagnosis not present

## 2011-06-03 DIAGNOSIS — C50919 Malignant neoplasm of unspecified site of unspecified female breast: Secondary | ICD-10-CM | POA: Diagnosis not present

## 2011-06-03 DIAGNOSIS — D0590 Unspecified type of carcinoma in situ of unspecified breast: Secondary | ICD-10-CM | POA: Diagnosis not present

## 2011-06-03 DIAGNOSIS — L988 Other specified disorders of the skin and subcutaneous tissue: Secondary | ICD-10-CM | POA: Diagnosis not present

## 2011-06-07 ENCOUNTER — Ambulatory Visit: Payer: Medicare Other

## 2011-06-07 ENCOUNTER — Ambulatory Visit
Admission: RE | Admit: 2011-06-07 | Discharge: 2011-06-07 | Disposition: A | Discharge status: Home / Self Care | Payer: Medicare Other | Source: Ambulatory Visit | Attending: Radiation Oncology | Admitting: Radiation Oncology

## 2011-06-07 DIAGNOSIS — D0590 Unspecified type of carcinoma in situ of unspecified breast: Secondary | ICD-10-CM | POA: Diagnosis not present

## 2011-06-07 DIAGNOSIS — C50919 Malignant neoplasm of unspecified site of unspecified female breast: Secondary | ICD-10-CM | POA: Diagnosis not present

## 2011-06-07 DIAGNOSIS — L988 Other specified disorders of the skin and subcutaneous tissue: Secondary | ICD-10-CM | POA: Diagnosis not present

## 2011-06-07 DIAGNOSIS — Z510 Encounter for antineoplastic radiation therapy: Secondary | ICD-10-CM | POA: Diagnosis not present

## 2011-06-07 DIAGNOSIS — Z79899 Other long term (current) drug therapy: Secondary | ICD-10-CM | POA: Diagnosis not present

## 2011-06-07 NOTE — Progress Notes (Signed)
  Radiation Oncology         [phone]) [phone] ________________________________  Name: Kara Diaz MRN: [id number]  Date: 06/03/2011  DOB: 1940-04-12  Simulation Verification Note  Status: outpatient  NARRATIVE: The patient was brought to the treatment unit and placed in the prone position. The clinical setup was verified. Then port films were obtained and uploaded to the radiation oncology medical record software.  The treatment beams were carefully compared against the planned radiation fields. The position location and shape of the radiation fields was reviewed. The targeted volume of tissue appears appropriately covered by the radiation beams. Organs at risk appear to be excluded as planned.  Based on my personal review, I approved the simulation verification. The patient's treatment will proceed as planned.  ------------------------------------------------  Lurline Hare, MD

## 2011-06-08 ENCOUNTER — Ambulatory Visit: Payer: Medicare Other

## 2011-06-08 ENCOUNTER — Ambulatory Visit
Admission: RE | Admit: 2011-06-08 | Discharge: 2011-06-08 | Disposition: A | Discharge status: Home / Self Care | Payer: Medicare Other | Source: Ambulatory Visit | Admitting: Radiation Oncology

## 2011-06-08 ENCOUNTER — Ambulatory Visit
Admission: RE | Admit: 2011-06-08 | Discharge: 2011-06-08 | Disposition: A | Discharge status: Home / Self Care | Payer: Medicare Other | Source: Ambulatory Visit | Attending: Radiation Oncology | Admitting: Radiation Oncology

## 2011-06-08 DIAGNOSIS — D0590 Unspecified type of carcinoma in situ of unspecified breast: Secondary | ICD-10-CM | POA: Diagnosis not present

## 2011-06-08 DIAGNOSIS — Z79899 Other long term (current) drug therapy: Secondary | ICD-10-CM | POA: Diagnosis not present

## 2011-06-08 DIAGNOSIS — L988 Other specified disorders of the skin and subcutaneous tissue: Secondary | ICD-10-CM | POA: Diagnosis not present

## 2011-06-08 DIAGNOSIS — D0510 Intraductal carcinoma in situ of unspecified breast: Secondary | ICD-10-CM

## 2011-06-08 DIAGNOSIS — Z510 Encounter for antineoplastic radiation therapy: Secondary | ICD-10-CM | POA: Diagnosis not present

## 2011-06-08 MED ORDER — RADIAPLEXRX EX GEL
Freq: Once | CUTANEOUS | Status: AC
  Administered 2011-06-08: 19:00:00 via TOPICAL

## 2011-06-08 MED ORDER — ALRA NON-METALLIC DEODORANT (RAD-ONC)
1.0000 "application " | Freq: Once | TOPICAL | Status: AC
  Administered 2011-06-08: 1 via TOPICAL

## 2011-06-08 NOTE — Progress Notes (Signed)
Weekly Management Note Current Dose:  4 Gy  Projected Dose: 50 Gy   Narrative:  The patient presents for routine under treatment assessment.  CBCT/MVCT images/Port film x-rays were reviewed.  The chart was checked. Post sim education performed today. No complaints.  Physical Findings: No skin changes.  Impression:  The patient is tolerating radiation.  Plan:  Continue treatment as planned.

## 2011-06-08 NOTE — Progress Notes (Signed)
Post sim ed completed w/pt. Gave her "Radiation and You" booklet, Radiaplex, [place] w/instructions for use. Pt verbalized understanding, all questions answered.

## 2011-06-08 NOTE — Progress Notes (Signed)
Encounter addended by: Glennie Hawk, RN on: 06/08/2011  6:39 PM<BR>     Documentation filed: Inpatient MAR, Orders

## 2011-06-09 ENCOUNTER — Ambulatory Visit
Admission: RE | Admit: 2011-06-09 | Discharge: 2011-06-09 | Disposition: A | Discharge status: Home / Self Care | Payer: Medicare Other | Source: Ambulatory Visit | Attending: Radiation Oncology | Admitting: Radiation Oncology

## 2011-06-09 DIAGNOSIS — L988 Other specified disorders of the skin and subcutaneous tissue: Secondary | ICD-10-CM | POA: Diagnosis not present

## 2011-06-09 DIAGNOSIS — Z79899 Other long term (current) drug therapy: Secondary | ICD-10-CM | POA: Diagnosis not present

## 2011-06-09 DIAGNOSIS — Z510 Encounter for antineoplastic radiation therapy: Secondary | ICD-10-CM | POA: Diagnosis not present

## 2011-06-09 DIAGNOSIS — D0590 Unspecified type of carcinoma in situ of unspecified breast: Secondary | ICD-10-CM | POA: Diagnosis not present

## 2011-06-10 ENCOUNTER — Ambulatory Visit
Admission: RE | Admit: 2011-06-10 | Discharge: 2011-06-10 | Disposition: A | Discharge status: Home / Self Care | Payer: Medicare Other | Source: Ambulatory Visit | Attending: Radiation Oncology | Admitting: Radiation Oncology

## 2011-06-10 DIAGNOSIS — D0590 Unspecified type of carcinoma in situ of unspecified breast: Secondary | ICD-10-CM | POA: Diagnosis not present

## 2011-06-10 DIAGNOSIS — L988 Other specified disorders of the skin and subcutaneous tissue: Secondary | ICD-10-CM | POA: Diagnosis not present

## 2011-06-10 DIAGNOSIS — Z79899 Other long term (current) drug therapy: Secondary | ICD-10-CM | POA: Diagnosis not present

## 2011-06-10 DIAGNOSIS — Z510 Encounter for antineoplastic radiation therapy: Secondary | ICD-10-CM | POA: Diagnosis not present

## 2011-06-13 ENCOUNTER — Ambulatory Visit
Admission: RE | Admit: 2011-06-13 | Discharge: 2011-06-13 | Disposition: A | Discharge status: Home / Self Care | Payer: Medicare Other | Source: Ambulatory Visit | Attending: Radiation Oncology | Admitting: Radiation Oncology

## 2011-06-13 DIAGNOSIS — Z79899 Other long term (current) drug therapy: Secondary | ICD-10-CM | POA: Diagnosis not present

## 2011-06-13 DIAGNOSIS — D0590 Unspecified type of carcinoma in situ of unspecified breast: Secondary | ICD-10-CM | POA: Diagnosis not present

## 2011-06-13 DIAGNOSIS — Z510 Encounter for antineoplastic radiation therapy: Secondary | ICD-10-CM | POA: Diagnosis not present

## 2011-06-13 DIAGNOSIS — L988 Other specified disorders of the skin and subcutaneous tissue: Secondary | ICD-10-CM | POA: Diagnosis not present

## 2011-06-14 ENCOUNTER — Encounter: Payer: Self-pay | Admitting: Radiation Oncology

## 2011-06-14 ENCOUNTER — Ambulatory Visit
Admission: RE | Admit: 2011-06-14 | Discharge: 2011-06-14 | Disposition: A | Discharge status: Home / Self Care | Payer: Medicare Other | Source: Ambulatory Visit | Attending: Radiation Oncology | Admitting: Radiation Oncology

## 2011-06-14 VITALS — BP 172/79 | HR 73 | Resp 18 | Wt 208.0 lb

## 2011-06-14 DIAGNOSIS — L988 Other specified disorders of the skin and subcutaneous tissue: Secondary | ICD-10-CM | POA: Diagnosis not present

## 2011-06-14 DIAGNOSIS — D0590 Unspecified type of carcinoma in situ of unspecified breast: Secondary | ICD-10-CM | POA: Diagnosis not present

## 2011-06-14 DIAGNOSIS — D0510 Intraductal carcinoma in situ of unspecified breast: Secondary | ICD-10-CM

## 2011-06-14 DIAGNOSIS — Z510 Encounter for antineoplastic radiation therapy: Secondary | ICD-10-CM | POA: Diagnosis not present

## 2011-06-14 DIAGNOSIS — C50919 Malignant neoplasm of unspecified site of unspecified female breast: Secondary | ICD-10-CM | POA: Diagnosis not present

## 2011-06-14 DIAGNOSIS — Z79899 Other long term (current) drug therapy: Secondary | ICD-10-CM | POA: Diagnosis not present

## 2011-06-14 NOTE — Progress Notes (Signed)
Weekly Management Note Current Dose:12  Gy  Projected Dose:50 Gy   Narrative:  The patient presents for routine under treatment assessment.  CBCT/MVCT images/Port film x-rays were reviewed.  The chart was checked. Not using radiaplex frequently. No skin complaints  Physical Findings: Weight: 208 lb (94.348 kg). Unchanged  Impression:  The patient is tolerating radiation.  Plan:  Continue treatment as planned. Continue radiaplex bid-tid

## 2011-06-14 NOTE — Progress Notes (Signed)
Patient presents to the clinic today for an under treat visit with Dr. Michell Heinrich. Patient is alert and oriented to person, place, and time. No distress noted. Steady gait noted. Pleasant affect noted. Patient denies pain/breast pain at this time. Patient denies skin changes. Patient reports using Radiaplex "when she can remember." Reinforced importance of bid cream use. Patient verbalized understanding. Blood pressure elevated despite taking bp meds this morning. Patient continues to not sleep well. Reported all findings to Dr. Michell Heinrich.

## 2011-06-15 ENCOUNTER — Ambulatory Visit
Admission: RE | Admit: 2011-06-15 | Discharge: 2011-06-15 | Disposition: A | Discharge status: Home / Self Care | Payer: Medicare Other | Source: Ambulatory Visit | Attending: Radiation Oncology | Admitting: Radiation Oncology

## 2011-06-15 DIAGNOSIS — Z79899 Other long term (current) drug therapy: Secondary | ICD-10-CM | POA: Diagnosis not present

## 2011-06-15 DIAGNOSIS — Z510 Encounter for antineoplastic radiation therapy: Secondary | ICD-10-CM | POA: Diagnosis not present

## 2011-06-15 DIAGNOSIS — D0590 Unspecified type of carcinoma in situ of unspecified breast: Secondary | ICD-10-CM | POA: Diagnosis not present

## 2011-06-15 DIAGNOSIS — L988 Other specified disorders of the skin and subcutaneous tissue: Secondary | ICD-10-CM | POA: Diagnosis not present

## 2011-06-16 ENCOUNTER — Ambulatory Visit
Admission: RE | Admit: 2011-06-16 | Discharge: 2011-06-16 | Disposition: A | Discharge status: Home / Self Care | Payer: Medicare Other | Source: Ambulatory Visit | Attending: Radiation Oncology | Admitting: Radiation Oncology

## 2011-06-16 DIAGNOSIS — L988 Other specified disorders of the skin and subcutaneous tissue: Secondary | ICD-10-CM | POA: Diagnosis not present

## 2011-06-16 DIAGNOSIS — Z510 Encounter for antineoplastic radiation therapy: Secondary | ICD-10-CM | POA: Diagnosis not present

## 2011-06-16 DIAGNOSIS — Z79899 Other long term (current) drug therapy: Secondary | ICD-10-CM | POA: Diagnosis not present

## 2011-06-16 DIAGNOSIS — D0590 Unspecified type of carcinoma in situ of unspecified breast: Secondary | ICD-10-CM | POA: Diagnosis not present

## 2011-06-17 ENCOUNTER — Ambulatory Visit
Admission: RE | Admit: 2011-06-17 | Discharge: 2011-06-17 | Disposition: A | Discharge status: Home / Self Care | Payer: Medicare Other | Source: Ambulatory Visit | Attending: Radiation Oncology | Admitting: Radiation Oncology

## 2011-06-17 DIAGNOSIS — L988 Other specified disorders of the skin and subcutaneous tissue: Secondary | ICD-10-CM | POA: Diagnosis not present

## 2011-06-17 DIAGNOSIS — Z510 Encounter for antineoplastic radiation therapy: Secondary | ICD-10-CM | POA: Diagnosis not present

## 2011-06-17 DIAGNOSIS — D0590 Unspecified type of carcinoma in situ of unspecified breast: Secondary | ICD-10-CM | POA: Diagnosis not present

## 2011-06-17 DIAGNOSIS — Z79899 Other long term (current) drug therapy: Secondary | ICD-10-CM | POA: Diagnosis not present

## 2011-06-20 ENCOUNTER — Ambulatory Visit
Admission: RE | Admit: 2011-06-20 | Discharge: 2011-06-20 | Disposition: A | Discharge status: Home / Self Care | Payer: Medicare Other | Source: Ambulatory Visit | Attending: Radiation Oncology | Admitting: Radiation Oncology

## 2011-06-20 DIAGNOSIS — L988 Other specified disorders of the skin and subcutaneous tissue: Secondary | ICD-10-CM | POA: Diagnosis not present

## 2011-06-20 DIAGNOSIS — D0590 Unspecified type of carcinoma in situ of unspecified breast: Secondary | ICD-10-CM | POA: Diagnosis not present

## 2011-06-20 DIAGNOSIS — Z510 Encounter for antineoplastic radiation therapy: Secondary | ICD-10-CM | POA: Diagnosis not present

## 2011-06-20 DIAGNOSIS — Z79899 Other long term (current) drug therapy: Secondary | ICD-10-CM | POA: Diagnosis not present

## 2011-06-21 ENCOUNTER — Encounter: Payer: Self-pay | Admitting: Radiation Oncology

## 2011-06-21 ENCOUNTER — Ambulatory Visit
Admission: RE | Admit: 2011-06-21 | Discharge: 2011-06-21 | Disposition: A | Discharge status: Home / Self Care | Payer: Medicare Other | Source: Ambulatory Visit | Attending: Radiation Oncology | Admitting: Radiation Oncology

## 2011-06-21 VITALS — BP 166/81 | HR 72 | Resp 18 | Wt 206.2 lb

## 2011-06-21 DIAGNOSIS — Z79899 Other long term (current) drug therapy: Secondary | ICD-10-CM | POA: Diagnosis not present

## 2011-06-21 DIAGNOSIS — C50919 Malignant neoplasm of unspecified site of unspecified female breast: Secondary | ICD-10-CM | POA: Diagnosis not present

## 2011-06-21 DIAGNOSIS — D0510 Intraductal carcinoma in situ of unspecified breast: Secondary | ICD-10-CM

## 2011-06-21 DIAGNOSIS — D0590 Unspecified type of carcinoma in situ of unspecified breast: Secondary | ICD-10-CM | POA: Diagnosis not present

## 2011-06-21 DIAGNOSIS — Z510 Encounter for antineoplastic radiation therapy: Secondary | ICD-10-CM | POA: Diagnosis not present

## 2011-06-21 DIAGNOSIS — L988 Other specified disorders of the skin and subcutaneous tissue: Secondary | ICD-10-CM | POA: Diagnosis not present

## 2011-06-21 MED ORDER — RADIAPLEXRX EX GEL
Freq: Once | CUTANEOUS | Status: AC
  Administered 2011-06-21: 11:00:00 via TOPICAL

## 2011-06-21 NOTE — Progress Notes (Addendum)
Patient presents to the clinic today unaccompanied for an under treat visit with Dr. Michell Heinrich. Patient is alert and oriented to person, place, and time. No distress noted. Steady gait noted. Pleasant affect noted. Patient denies pain at this time. Skin without hyperpigmentation or desqumation. Patient reports not using Radiaplex as directed. Patient states, "I keep a tube on my lower level for during the day but, at night I climb stairs to go up to my room and often forget the tube." Provide patient with additional tube to keep upstairs. Reinforced skin care. Patient verbalized understanding. Patient reports her only complaint today is that she frequently has to clear her throat now causing her throat to feel raw and sore often." Patient denies having seasonal allergies. Reported all findings to Dr. Michell Heinrich.

## 2011-06-21 NOTE — Progress Notes (Signed)
Weekly Management Note Current Dose: 22  Gy  Projected Dose: 50 Gy   Narrative:  The patient presents for routine under treatment assessment.  CBCT/MVCT images/Port film x-rays were reviewed.  The chart was checked. Doing well. Some cough, congestion, mucus. Worried about taking allergy pills due to her blood pressure. Using radiaplex but not as often as she should. Requests another bottle to help with this.  Physical Findings: Weight: 206 lb 3.2 oz (93.532 kg). Unchanged. Inframammary folds clear.   Impression:  The patient is tolerating radiation.  Plan:  Continue treatment as planned. Continue radiaplex.

## 2011-06-22 ENCOUNTER — Ambulatory Visit
Admission: RE | Admit: 2011-06-22 | Discharge: 2011-06-22 | Disposition: A | Discharge status: Home / Self Care | Payer: Medicare Other | Source: Ambulatory Visit | Attending: Radiation Oncology | Admitting: Radiation Oncology

## 2011-06-22 DIAGNOSIS — L988 Other specified disorders of the skin and subcutaneous tissue: Secondary | ICD-10-CM | POA: Diagnosis not present

## 2011-06-22 DIAGNOSIS — Z510 Encounter for antineoplastic radiation therapy: Secondary | ICD-10-CM | POA: Diagnosis not present

## 2011-06-22 DIAGNOSIS — Z79899 Other long term (current) drug therapy: Secondary | ICD-10-CM | POA: Diagnosis not present

## 2011-06-22 DIAGNOSIS — D0590 Unspecified type of carcinoma in situ of unspecified breast: Secondary | ICD-10-CM | POA: Diagnosis not present

## 2011-06-23 ENCOUNTER — Ambulatory Visit
Admission: RE | Admit: 2011-06-23 | Discharge: 2011-06-23 | Disposition: A | Discharge status: Home / Self Care | Payer: Medicare Other | Source: Ambulatory Visit | Attending: Radiation Oncology | Admitting: Radiation Oncology

## 2011-06-23 DIAGNOSIS — L988 Other specified disorders of the skin and subcutaneous tissue: Secondary | ICD-10-CM | POA: Diagnosis not present

## 2011-06-23 DIAGNOSIS — Z79899 Other long term (current) drug therapy: Secondary | ICD-10-CM | POA: Diagnosis not present

## 2011-06-23 DIAGNOSIS — D0590 Unspecified type of carcinoma in situ of unspecified breast: Secondary | ICD-10-CM | POA: Diagnosis not present

## 2011-06-23 DIAGNOSIS — Z510 Encounter for antineoplastic radiation therapy: Secondary | ICD-10-CM | POA: Diagnosis not present

## 2011-06-24 ENCOUNTER — Ambulatory Visit
Admission: RE | Admit: 2011-06-24 | Discharge: 2011-06-24 | Disposition: A | Discharge status: Home / Self Care | Payer: Medicare Other | Source: Ambulatory Visit | Attending: Radiation Oncology | Admitting: Radiation Oncology

## 2011-06-24 DIAGNOSIS — Z510 Encounter for antineoplastic radiation therapy: Secondary | ICD-10-CM | POA: Diagnosis not present

## 2011-06-24 DIAGNOSIS — Z79899 Other long term (current) drug therapy: Secondary | ICD-10-CM | POA: Diagnosis not present

## 2011-06-24 DIAGNOSIS — L988 Other specified disorders of the skin and subcutaneous tissue: Secondary | ICD-10-CM | POA: Diagnosis not present

## 2011-06-24 DIAGNOSIS — D0590 Unspecified type of carcinoma in situ of unspecified breast: Secondary | ICD-10-CM | POA: Diagnosis not present

## 2011-06-27 ENCOUNTER — Ambulatory Visit
Admission: RE | Admit: 2011-06-27 | Discharge: 2011-06-27 | Disposition: A | Discharge status: Home / Self Care | Payer: Medicare Other | Source: Ambulatory Visit | Attending: Radiation Oncology | Admitting: Radiation Oncology

## 2011-06-27 DIAGNOSIS — Z510 Encounter for antineoplastic radiation therapy: Secondary | ICD-10-CM | POA: Diagnosis not present

## 2011-06-27 DIAGNOSIS — D0590 Unspecified type of carcinoma in situ of unspecified breast: Secondary | ICD-10-CM | POA: Diagnosis not present

## 2011-06-27 DIAGNOSIS — L988 Other specified disorders of the skin and subcutaneous tissue: Secondary | ICD-10-CM | POA: Diagnosis not present

## 2011-06-27 DIAGNOSIS — Z79899 Other long term (current) drug therapy: Secondary | ICD-10-CM | POA: Diagnosis not present

## 2011-06-28 ENCOUNTER — Ambulatory Visit
Admission: RE | Admit: 2011-06-28 | Discharge: 2011-06-28 | Disposition: A | Discharge status: Home / Self Care | Payer: Medicare Other | Source: Ambulatory Visit | Attending: Radiation Oncology | Admitting: Radiation Oncology

## 2011-06-28 ENCOUNTER — Encounter: Payer: Self-pay | Admitting: *Deleted

## 2011-06-28 ENCOUNTER — Encounter: Payer: Self-pay | Admitting: Radiation Oncology

## 2011-06-28 VITALS — BP 157/86 | HR 77 | Resp 18 | Wt 206.9 lb

## 2011-06-28 DIAGNOSIS — L988 Other specified disorders of the skin and subcutaneous tissue: Secondary | ICD-10-CM | POA: Diagnosis not present

## 2011-06-28 DIAGNOSIS — Z510 Encounter for antineoplastic radiation therapy: Secondary | ICD-10-CM | POA: Diagnosis not present

## 2011-06-28 DIAGNOSIS — D0510 Intraductal carcinoma in situ of unspecified breast: Secondary | ICD-10-CM

## 2011-06-28 DIAGNOSIS — Z79899 Other long term (current) drug therapy: Secondary | ICD-10-CM | POA: Diagnosis not present

## 2011-06-28 DIAGNOSIS — D0590 Unspecified type of carcinoma in situ of unspecified breast: Secondary | ICD-10-CM | POA: Diagnosis not present

## 2011-06-28 DIAGNOSIS — C50919 Malignant neoplasm of unspecified site of unspecified female breast: Secondary | ICD-10-CM | POA: Diagnosis not present

## 2011-06-28 NOTE — Progress Notes (Signed)
   Department of Radiation Oncology  Phone:  [phone] Fax:        [phone]   Weekly Management Note Current Dose: 32.0 Gy  Projected Dose: 50.0  Gy   Narrative:  The patient presents for routine under treatment assessment.  Port film x-rays were reviewed.  The chart was checked.  She is tolerating treatment well without a significant itching or discomfort or fatigue.  Physical Findings: Weight: 206 lb 14.4 oz (93.849 kg). The lungs are clear. The heart has a regular rhythm and rate. The left breast area shows mild hyperpigmentation changes.  Impression:  The patient is tolerating radiation.  Plan:  Continue treatment as planned.   -----------------------------------  Billie Lade, PhD, MD

## 2011-06-28 NOTE — Progress Notes (Signed)
Patient presents to the clinic today for a PUT with Dr. Roselind Messier. Patient is alert and oriented to person, place, and time. No distress noted. Steady gait noted. Pleasant affect noted. Patient denies pain at this time. Patient reports that her back ached during the night but has resolved. Patient reports that she continues to not sleep well. Patient denies nausea, vomiting, headache, dizziness, or diarrhea. Patient denies breast pain. Only faint hyperpigmentation without desquamation of the left/treated breast noted. Patient reports using radiaplex as directed. Patient has no other complaints at this time. Reported all findings to Dr. Roselind Messier.

## 2011-06-29 ENCOUNTER — Telehealth: Payer: Self-pay | Admitting: Radiation Oncology

## 2011-06-29 ENCOUNTER — Encounter: Payer: Self-pay | Admitting: *Deleted

## 2011-06-29 ENCOUNTER — Telehealth: Payer: Self-pay | Admitting: Oncology

## 2011-06-29 ENCOUNTER — Ambulatory Visit
Admission: RE | Admit: 2011-06-29 | Discharge: 2011-06-29 | Disposition: A | Discharge status: Home / Self Care | Payer: Medicare Other | Source: Ambulatory Visit | Attending: Radiation Oncology | Admitting: Radiation Oncology

## 2011-06-29 ENCOUNTER — Telehealth: Payer: Self-pay | Admitting: *Deleted

## 2011-06-29 DIAGNOSIS — L988 Other specified disorders of the skin and subcutaneous tissue: Secondary | ICD-10-CM | POA: Diagnosis not present

## 2011-06-29 DIAGNOSIS — D0590 Unspecified type of carcinoma in situ of unspecified breast: Secondary | ICD-10-CM | POA: Diagnosis not present

## 2011-06-29 DIAGNOSIS — Z79899 Other long term (current) drug therapy: Secondary | ICD-10-CM | POA: Diagnosis not present

## 2011-06-29 DIAGNOSIS — Z510 Encounter for antineoplastic radiation therapy: Secondary | ICD-10-CM | POA: Diagnosis not present

## 2011-06-29 NOTE — Telephone Encounter (Signed)
Yesterday patient expressed concern that follow up appointment with Dr. Welton Flakes was so far out from radiation completion date. Following conversation with patient and Corrie Dandy, RN for Dr. Welton Flakes called patient at work number listed in demographics. No answer and no option to leave message. Called patient at home number left message with appointment date and time for Dr. Welton Flakes. Encouraged patient to contact this Clinical research associate with future needs.

## 2011-06-29 NOTE — Telephone Encounter (Signed)
Left voice message to inform the patient of the new date and time on 07-05-2011 at 11:30am asked patient to please call me back and let me know they did get the message

## 2011-06-29 NOTE — Telephone Encounter (Signed)
lmonvm adviisng the pt of her r/s appt time from 11:30am to 1:30pm with the md

## 2011-06-30 ENCOUNTER — Ambulatory Visit
Admission: RE | Admit: 2011-06-30 | Discharge: 2011-06-30 | Disposition: A | Discharge status: Home / Self Care | Payer: Medicare Other | Source: Ambulatory Visit | Attending: Radiation Oncology | Admitting: Radiation Oncology

## 2011-06-30 ENCOUNTER — Other Ambulatory Visit: Payer: Medicare Other | Admitting: Lab

## 2011-06-30 DIAGNOSIS — Z510 Encounter for antineoplastic radiation therapy: Secondary | ICD-10-CM | POA: Diagnosis not present

## 2011-06-30 DIAGNOSIS — L988 Other specified disorders of the skin and subcutaneous tissue: Secondary | ICD-10-CM | POA: Diagnosis not present

## 2011-06-30 DIAGNOSIS — Z79899 Other long term (current) drug therapy: Secondary | ICD-10-CM | POA: Diagnosis not present

## 2011-06-30 DIAGNOSIS — D0590 Unspecified type of carcinoma in situ of unspecified breast: Secondary | ICD-10-CM | POA: Diagnosis not present

## 2011-07-01 ENCOUNTER — Ambulatory Visit
Admission: RE | Admit: 2011-07-01 | Discharge: 2011-07-01 | Disposition: A | Discharge status: Home / Self Care | Payer: Medicare Other | Source: Ambulatory Visit | Attending: Radiation Oncology | Admitting: Radiation Oncology

## 2011-07-01 DIAGNOSIS — L988 Other specified disorders of the skin and subcutaneous tissue: Secondary | ICD-10-CM | POA: Diagnosis not present

## 2011-07-01 DIAGNOSIS — Z79899 Other long term (current) drug therapy: Secondary | ICD-10-CM | POA: Diagnosis not present

## 2011-07-01 DIAGNOSIS — D0590 Unspecified type of carcinoma in situ of unspecified breast: Secondary | ICD-10-CM | POA: Diagnosis not present

## 2011-07-01 DIAGNOSIS — Z510 Encounter for antineoplastic radiation therapy: Secondary | ICD-10-CM | POA: Diagnosis not present

## 2011-07-04 ENCOUNTER — Ambulatory Visit
Admission: RE | Admit: 2011-07-04 | Discharge: 2011-07-04 | Disposition: A | Discharge status: Home / Self Care | Payer: Medicare Other | Source: Ambulatory Visit | Attending: Radiation Oncology | Admitting: Radiation Oncology

## 2011-07-04 DIAGNOSIS — D0590 Unspecified type of carcinoma in situ of unspecified breast: Secondary | ICD-10-CM | POA: Diagnosis not present

## 2011-07-04 DIAGNOSIS — Z510 Encounter for antineoplastic radiation therapy: Secondary | ICD-10-CM | POA: Diagnosis not present

## 2011-07-04 DIAGNOSIS — L988 Other specified disorders of the skin and subcutaneous tissue: Secondary | ICD-10-CM | POA: Diagnosis not present

## 2011-07-04 DIAGNOSIS — Z79899 Other long term (current) drug therapy: Secondary | ICD-10-CM | POA: Diagnosis not present

## 2011-07-05 ENCOUNTER — Telehealth: Payer: Self-pay | Admitting: Oncology

## 2011-07-05 ENCOUNTER — Ambulatory Visit
Admission: RE | Admit: 2011-07-05 | Discharge: 2011-07-05 | Disposition: A | Discharge status: Home / Self Care | Payer: Medicare Other | Source: Ambulatory Visit | Attending: Radiation Oncology | Admitting: Radiation Oncology

## 2011-07-05 ENCOUNTER — Encounter: Payer: Self-pay | Admitting: Oncology

## 2011-07-05 ENCOUNTER — Ambulatory Visit (HOSPITAL_BASED_OUTPATIENT_CLINIC_OR_DEPARTMENT_OTHER): Payer: Medicare Other | Admitting: Oncology

## 2011-07-05 ENCOUNTER — Encounter: Payer: Self-pay | Admitting: Radiation Oncology

## 2011-07-05 VITALS — BP 165/76 | HR 81 | Temp 98.7°F | Ht 64.0 in | Wt 206.0 lb

## 2011-07-05 VITALS — BP 167/83 | HR 72 | Resp 18 | Wt 206.6 lb

## 2011-07-05 DIAGNOSIS — D0510 Intraductal carcinoma in situ of unspecified breast: Secondary | ICD-10-CM

## 2011-07-05 DIAGNOSIS — L988 Other specified disorders of the skin and subcutaneous tissue: Secondary | ICD-10-CM | POA: Diagnosis not present

## 2011-07-05 DIAGNOSIS — Z79899 Other long term (current) drug therapy: Secondary | ICD-10-CM | POA: Diagnosis not present

## 2011-07-05 DIAGNOSIS — Z510 Encounter for antineoplastic radiation therapy: Secondary | ICD-10-CM | POA: Diagnosis not present

## 2011-07-05 DIAGNOSIS — E559 Vitamin D deficiency, unspecified: Secondary | ICD-10-CM | POA: Diagnosis not present

## 2011-07-05 DIAGNOSIS — D0590 Unspecified type of carcinoma in situ of unspecified breast: Secondary | ICD-10-CM

## 2011-07-05 DIAGNOSIS — C50919 Malignant neoplasm of unspecified site of unspecified female breast: Secondary | ICD-10-CM | POA: Diagnosis not present

## 2011-07-05 MED ORDER — EXEMESTANE 25 MG PO TABS: 25.0000 mg | ORAL_TABLET | Freq: Every day | ORAL | Status: AC

## 2011-07-05 NOTE — Progress Notes (Signed)
OFFICE PROGRESS NOTE  CC  DEAN, ERIC, MD [address], [address] [facility] [place] [address] Dr. Harriette Bou[address] Dr. 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 The final pathology revealed high-grade DCIS measuring 0.5 cm. The tumor was ER +80% PR +70%.  #2 patient will complete radiation therapy next week on 07/11/2011.  #3 patient will begin antiestrogen therapy with Aromasin 25 mg daily as a chemoprevention for ipsilateral and contralateral breasts On 07/14/2011.. A total of 5 years of therapy is planned.  CURRENT THERAPY: Aromasin 25 mg daily to begin on 07/14/2011.  INTERVAL HISTORY: Kara Diaz 70 y.o. female returns for Followup visit..Is doing well she is tolerating the radiation very well. She has minimal side effects in terms of skin toxicity. She denies any tenderness she has no skin breakdown. Clinically patient otherwise seems to be doing well.  MEDICAL HISTORY: Past Medical History  Diagnosis Date  . Angina   . PONV (postoperative nausea and vomiting)   . Hypertension     takes Dyazide and Lotrel daily  . Hyperlipidemia     doesn't take any meds for this  . Pneumonia     hx of in 1992  . Joint pain   . Joint swelling   . Arthritis     all over  . Hemorrhoids   . Urinary frequency   . Early cataracts, bilateral   . Insomnia     but doesn't take anything for this  . Breast cancer 03/2011    high grade ductal carcinoma in situ left breast    ALLERGIES:   has no known allergies.  MEDICATIONS:  Current Outpatient Prescriptions  Medication Sig Dispense Refill  . amLODipine-benazepril (LOTREL) 10-20 MG per capsule Take 1 capsule by mouth daily.      Marland Kitchen aspirin EC 81 MG tablet Take 81 mg by mouth daily.      Marland Kitchen b complex vitamins tablet Take 1 tablet by mouth daily.      . Calcium  600-200 MG-UNIT per tablet Take 1 tablet by mouth daily.      . Cholecalciferol (VITAMIN D3) 2000 UNITS TABS Take 1 tablet by mouth daily.      . Cobalamine Combinations (FOLIC + B12 PO) Take by mouth.      . cycloSPORINE (RESTASIS) 0.05 % ophthalmic emulsion Place 1 drop into both eyes 2 (two) times daily.      Marland Kitchen GLUCOSAMINE-CHONDROITIN PO Take 5 capsules by mouth daily.      . hyaluronate sodium (RADIAPLEXRX) GEL Apply topically 2 (two) times daily.      Marland Kitchen HYDROcodone-acetaminophen (NORCO) 10-325 MG per tablet Take 1 tablet by mouth every 4 (four) hours as needed for pain.  30 tablet  0  . Magnesium 400 MG CAPS Take 1 tablet by mouth daily.      . Multiple Vitamins-Minerals (MULTIVITAMINS THER. W/MINERALS) TABS Take 1 tablet by mouth daily.      . nitroGLYCERIN (NITROSTAT) 0.4 MG SL tablet Place 0.4 mg under the tongue every 5 (five) minutes as needed. For chest pain.      . non-metallic deodorant Thornton Papas) MISC Apply 1 application topically daily as needed.      . Omega-3 Fatty Acids (FISH OIL) 1000 MG CAPS Take 1 capsule by mouth daily.      Marland Kitchen OVER THE COUNTER MEDICATION  Take 2 tablets by mouth daily. Circulation and vein support for healthy legs.      . potassium chloride SA (K-DUR,KLOR-CON) 20 MEQ tablet Take 20 mEq by mouth daily.      Marland Kitchen triamterene-hydrochlorothiazide (DYAZIDE) 37.5-25 MG per capsule Take 1 capsule by mouth every morning.      . vitamin C (ASCORBIC ACID) 500 MG tablet Take 500 mg by mouth daily.        SURGICAL HISTORY:  Past Surgical History  Procedure Date  . Rotator cuff surgery 1998    right  . Tonsillectomy     as a child  . Myomectomy     early 29's  . Cholecystectomy late 90's  . Cardiac catheterization 12/16/10  . Colonoscopy   . Breast lumpectomy     left breast lumpectomy  . Abdominal hysterectomy 1980    partial hysterectomy    REVIEW OF SYSTEMS:  Pertinent items are noted in HPI.   PHYSICAL EXAMINATION: General appearance: alert, cooperative and  appears stated age Lymph nodes: Cervical, supraclavicular, and axillary nodes normal. Resp: clear to auscultation bilaterally and normal percussion bilaterally Back: symmetric, no curvature. ROM normal. No CVA tenderness. Cardio: regular rate and rhythm, S1, S2 normal, no murmur, click, rub or gallop GI: soft, non-tender; bowel sounds normal; no masses,  no organomegaly Extremities: extremities normal, atraumatic, no cyanosis or edema Neurologic: Grossly normal  ECOG PERFORMANCE STATUS: 1 - Symptomatic but completely ambulatory  Blood pressure 165/76, pulse 81, temperature 98.7 F (37.1 C), temperature source Oral, height 5\' 4"  (1.626 m), weight 206 lb (93.441 kg).  LABORATORY DATA: Lab Results  Component Value Date   WBC 4.4 05/13/2011   HGB 12.7 05/13/2011   HCT 38.1 05/13/2011   MCV 95.6 05/13/2011   PLT 331 05/13/2011      Chemistry      Component Value Date/Time   NA 141 05/13/2011 1357   K 3.8 05/13/2011 1357   CL 102 05/13/2011 1357   CO2 31 05/13/2011 1357   BUN 18 05/13/2011 1357   CREATININE 0.55 05/13/2011 1357      Component Value Date/Time   CALCIUM 10.1 05/13/2011 1357   ALKPHOS 91 05/13/2011 1357   AST 31 05/13/2011 1357   ALT 22 05/13/2011 1357   BILITOT 0.6 05/13/2011 1357     Diagnosis Breast, lumpectomy, Left - RESIDUAL INTERMEDIATE TO HIGH GRADE DCIS, APPROXIMATELY 0.5 CM IN DIMENSION, STATUS POST BIOPSY. - SEE COMMENT. Microscopic Comment BREAST, IN SITU CARCINOMA Specimen, including laterality: Left breast. Procedure: Excision with wire guided localization. Grade of carcinoma: Intermediate to high grade. Necrosis: Present. Estimated tumor size: (gross measurement): 0.5 cm Treatment effect: N/A Distance to closest margin: Intermediate grade DCIS comes to within 0.4 cm of inferior margin (closest margin). Remaining margins 1 cm or greater. Breast prognostic profile: Estrogen receptor: 8% ([id number]) Progesterone receptor: 7% ([id number]) Lymph nodes: None  received. TNM: pTis, pNX Comments: Sections show a stellate fibrotic scar area containing foreign material consistent with a previous biopsy site. At the periphery of the scar, there are multiple foci of intermediate to high grade DCIS with focal necrosis. The DCIS is present in four of five paraffin blocks representing the entirety of the scar/biopsy site and spans a distance of approximately 0.5 cm (estimated gross measurement). Additional clinical correlation with radiographic studies is recommended for the estimation of the lesional size. ER/PR studies were performed on the recent biopsy specimen ([id number]). (RM:gt, 05/10/11) Lloyd Huger M.D. Ph.D. Pathologist, Electronic Signature (Case signed 05/10/2011) Specimen  Gross and Clinical Information 1 of  ASSESSMENT: 71 year old female with:  1.  high-grade DCIS measuring about 0.5 cm. She is now status post lumpectomy. Postoperatively she is doing well. Will now complete radiation therapy to the left breast on 07/11/2011. Overall she has tolerated the radiation well. After this she will begin Aromasin 25 mg daily beginning 07/14/2011. Risks and benefits of Aromasin were discussed with her as well as literature was given to her.  PLAN:   #1 patient will complete her radiation therapy.  #2 she will begin Aromasin.  #3 I will plan on seeing her back in 3 month's time in followup. If she is doing well with the Aromasin thereafter she will be seen on a every 6 month basis for the first 5 years and then yearly thereafter.  All questions were answered. The patient knows to call the clinic with any problems, questions or concerns. We can certainly see the patient much sooner if necessary.  I spent 25 minutes counseling the patient face to face. The total time spent in the appointment was 30 minutes.    Drue Second, MD Medical/Oncology [facility] [phone] (beeper) [phone] (Office)  07/05/2011, 1:09 PM

## 2011-07-05 NOTE — Progress Notes (Signed)
Patient presents to the clinic today unaccompanied for an under treat visit with Dr. Michell Heinrich. Patient is alert and oriented to person, place, and time. No distress noted. Steady gait noted. Pleasant affect noted. Patient denies pain at this time. Patient reports using Radiaplex only once daily. Only faint hyperpigmentation of left/treated breast noted. Patient has no complaints at this time. Reported all findings to Dr. Michell Heinrich.

## 2011-07-05 NOTE — Progress Notes (Signed)
Weekly Management Note Current Dose:  42 Gy  Projected Dose: 50 Gy   Narrative:  The patient presents for routine under treatment assessment.  CBCT/MVCT images/Port film x-rays were reviewed.  The chart was checked. Doing well. Using radiaplex at most once a day.  Would like a note to say she is cleared for knee surgery on 7/25.   Physical Findings: Weight: 206 lb 9.6 oz (93.713 kg). Dark left breast (> medially). No moist desquamation. Inframammary folds clean.  Impression:  The patient is tolerating radiation.  Plan:  Continue treatment as planned. Continue radiaplex then on to lotion with vitamin e. Follow up in 2 months (1 month is her surgery date!). Call with questions. Letter written

## 2011-07-05 NOTE — Patient Instructions (Addendum)
1. After you finish radiation next week begin aromasin 25 mg daily. [facility]  2. I will see you back in 3 months for followup but please call if you start to experience any side effects from the aromasin as discussed and as below.  Exemestane tablets What is this medicine? EXEMESTANE (ex e MES tane) blocks the production of the hormone estrogen. Some types of breast cancer depend on estrogen to grow, and this medicine can stop tumor growth by blocking estrogen production. This medicine is for the treatment of breast cancer in postmenopausal women only. This medicine may be used for other purposes; ask your health care provider or pharmacist if you have questions. What should I tell my health care provider before I take this medicine? They need to know if you have any of these conditions: -an unusual or allergic reaction to exemestane, other medicines, foods, dyes, or preservatives -pregnant or trying to get pregnant -breast-feeding How should I use this medicine? Take this medicine by mouth with a glass of water. Follow the directions on the prescription label. Take your doses at regular intervals after a meal. Do not take your medicine more often than directed. Do not stop taking except on the advice of your doctor or health care professional. Contact your pediatrician regarding the use of this medicine in children. Special care may be needed. Overdosage: If you think you have taken too much of this medicine contact a poison control center or emergency room at once. NOTE: This medicine is only for you. Do not share this medicine with others. What if I miss a dose? If you miss a dose, take the next dose as usual. Do not try to make up the missed dose. Do not take double or extra doses. What may interact with this medicine? Do not take this medicine with any of the following medications: -female hormones, like estrogens and birth control pills This medicine may  also interact with the following medications: -androstenedione -phenytoin -rifabutin, rifampin, or rifapentine -St. John's Wort This list may not describe all possible interactions. Give your health care provider a list of all the medicines, herbs, non-prescription drugs, or dietary supplements you use. Also tell them if you smoke, drink alcohol, or use illegal drugs. Some items may interact with your medicine. What should I watch for while using this medicine? Visit your doctor or health care professional for regular checks on your progress. If you experience hot flashes or sweating while taking this medicine, avoid alcohol, smoking and drinks with caffeine. This may help to decrease these side effects. What side effects may I notice from receiving this medicine? Side effects that you should report to your doctor or health care professional as soon as possible: -any new or unusual symptoms -changes in vision -fever -leg or arm swelling -pain in bones, joints, or muscles -pain in hips, back, ribs, arms, shoulders, or legs Side effects that usually do not require medical attention (report to your doctor or health care professional if they continue or are bothersome): -difficulty sleeping -headache -hot flashes -sweating -unusually weak or tired This list may not describe all possible side effects. Call your doctor for medical advice about side effects. You may report side effects to FDA at 1-800-FDA-1088. Where should I keep my medicine? Keep out of the reach of children. Store at room temperature between 15 and 30 degrees C (59 and 86 degrees F). Throw away any unused medicine after the expiration date. NOTE: This sheet is  a summary. It may not cover all possible information. If you have questions about this medicine, talk to your doctor, pharmacist, or health care provider.  2012, Elsevier/Gold Standard. (05/01/2007 11:48:29 AM)

## 2011-07-05 NOTE — Telephone Encounter (Signed)
gve the pt her sept 2013 appt calendar °

## 2011-07-06 ENCOUNTER — Ambulatory Visit
Admission: RE | Admit: 2011-07-06 | Discharge: 2011-07-06 | Disposition: A | Discharge status: Home / Self Care | Payer: Medicare Other | Source: Ambulatory Visit | Attending: Radiation Oncology | Admitting: Radiation Oncology

## 2011-07-06 DIAGNOSIS — Z510 Encounter for antineoplastic radiation therapy: Secondary | ICD-10-CM | POA: Diagnosis not present

## 2011-07-06 DIAGNOSIS — L988 Other specified disorders of the skin and subcutaneous tissue: Secondary | ICD-10-CM | POA: Diagnosis not present

## 2011-07-06 DIAGNOSIS — Z79899 Other long term (current) drug therapy: Secondary | ICD-10-CM | POA: Diagnosis not present

## 2011-07-06 DIAGNOSIS — D0590 Unspecified type of carcinoma in situ of unspecified breast: Secondary | ICD-10-CM | POA: Diagnosis not present

## 2011-07-07 ENCOUNTER — Ambulatory Visit
Admission: RE | Admit: 2011-07-07 | Discharge: 2011-07-07 | Disposition: A | Discharge status: Home / Self Care | Payer: Medicare Other | Source: Ambulatory Visit | Attending: Radiation Oncology | Admitting: Radiation Oncology

## 2011-07-07 DIAGNOSIS — D0590 Unspecified type of carcinoma in situ of unspecified breast: Secondary | ICD-10-CM | POA: Diagnosis not present

## 2011-07-07 DIAGNOSIS — Z79899 Other long term (current) drug therapy: Secondary | ICD-10-CM | POA: Diagnosis not present

## 2011-07-07 DIAGNOSIS — L988 Other specified disorders of the skin and subcutaneous tissue: Secondary | ICD-10-CM | POA: Diagnosis not present

## 2011-07-07 DIAGNOSIS — Z510 Encounter for antineoplastic radiation therapy: Secondary | ICD-10-CM | POA: Diagnosis not present

## 2011-07-08 ENCOUNTER — Ambulatory Visit
Admission: RE | Admit: 2011-07-08 | Discharge: 2011-07-08 | Disposition: A | Discharge status: Home / Self Care | Payer: Medicare Other | Source: Ambulatory Visit | Attending: Radiation Oncology | Admitting: Radiation Oncology

## 2011-07-08 ENCOUNTER — Other Ambulatory Visit: Payer: Medicare Other | Admitting: Lab

## 2011-07-08 DIAGNOSIS — Z510 Encounter for antineoplastic radiation therapy: Secondary | ICD-10-CM | POA: Diagnosis not present

## 2011-07-08 DIAGNOSIS — L988 Other specified disorders of the skin and subcutaneous tissue: Secondary | ICD-10-CM | POA: Diagnosis not present

## 2011-07-08 DIAGNOSIS — D0590 Unspecified type of carcinoma in situ of unspecified breast: Secondary | ICD-10-CM | POA: Diagnosis not present

## 2011-07-08 DIAGNOSIS — Z79899 Other long term (current) drug therapy: Secondary | ICD-10-CM | POA: Diagnosis not present

## 2011-07-11 ENCOUNTER — Encounter: Payer: Self-pay | Admitting: Radiation Oncology

## 2011-07-11 ENCOUNTER — Ambulatory Visit
Admission: RE | Admit: 2011-07-11 | Discharge: 2011-07-11 | Disposition: A | Discharge status: Home / Self Care | Payer: Medicare Other | Source: Ambulatory Visit | Attending: Radiation Oncology | Admitting: Radiation Oncology

## 2011-07-11 VITALS — BP 172/89 | HR 76 | Temp 97.6°F | Resp 18 | Wt 208.1 lb

## 2011-07-11 DIAGNOSIS — Z510 Encounter for antineoplastic radiation therapy: Secondary | ICD-10-CM | POA: Diagnosis not present

## 2011-07-11 DIAGNOSIS — L988 Other specified disorders of the skin and subcutaneous tissue: Secondary | ICD-10-CM | POA: Diagnosis not present

## 2011-07-11 DIAGNOSIS — Z79899 Other long term (current) drug therapy: Secondary | ICD-10-CM | POA: Diagnosis not present

## 2011-07-11 DIAGNOSIS — D0590 Unspecified type of carcinoma in situ of unspecified breast: Secondary | ICD-10-CM | POA: Diagnosis not present

## 2011-07-11 DIAGNOSIS — D0510 Intraductal carcinoma in situ of unspecified breast: Secondary | ICD-10-CM

## 2011-07-11 MED ORDER — BIAFINE EX EMUL
Freq: Every day | CUTANEOUS | Status: DC
  Administered 2011-07-11: 12:00:00 via TOPICAL

## 2011-07-11 NOTE — Progress Notes (Signed)
   Department of Radiation Oncology  Phone:  [phone] Fax:        [phone]   Weekly Management Note  Current Dose:  50 Gy  Projected Dose: 50  Gy   Narrative:  The patient presents for routine under treatment assessment.  CBCT/MVCT images/Port film x-rays were reviewed.  The chart was checked. The patient completed her last radiation therapy earlier today. She is doing well at this time without any significant pruritus or discomfort in the breast.  Physical Findings: Weight: 208 lb 1.6 oz (94.394 kg). Hyperpigmentation changes throughout the breast and some dry desquamation but no moist desquamation.  Impression:  The patient has completed her course of radiation therapy.  Plan:  Followup with Dr. Michell Heinrich in one month. -----------------------------------  Billie Lade, PhD, MD

## 2011-07-11 NOTE — Progress Notes (Signed)
  Radiation Oncology         ([phone]) [phone] ________________________________  Name: Kara Diaz MRN: [id number]  Date: 07/11/2011  DOB: 21-May-1940  End of Treatment Note  Diagnosis:   DCIS of the left breast  Indication for treatment:  Curative      Radiation treatment dates:   06/07/2011-07/11/2011  Site/dose:   Prone breast / 50 Gy @ 2 Gy per fraction x 25 fractions  Beams/energy:  Opposed tangents with reduced fields / 6 and 10 MV photons  Narrative: The patient tolerated radiation treatment relatively well.   She had minimal moist desquamation in the inframammary folds.  I wrote a note at her request clearing her for orthopedic surgery.   Plan: The patient has completed radiation treatment. The patient will return to radiation oncology clinic for routine followup in one month. I advised them to call or return sooner if they have any questions or concerns related to their recovery or treatment.  ------------------------------------------------  Lurline Hare, MD

## 2011-07-11 NOTE — Progress Notes (Signed)
Patient presents to the clinic today unaccompanied for an under treat visit following final treatment with Dr. Roselind Messier. Patient is alert and oriented to person, place, and time. No distress noted. Steady gait noted. Pleasant affect noted. Patient reports using Radiaplex as directed. Patient reports Radiaplex burns slightly. Provided patient with tube of Biafine and directed upon use. Patient verbalized understanding. Hyperpigmentation without desquamation noted. Patient reports left breast pain when she isn't wearing a bra for compression 10 on a scale of 0-10 but, with the bra 2 on a scale of 0-10. Patient already has follow up appointment with Dr. Michell Heinrich and Welton Flakes scheduled. Reported all findings to Dr. Roselind Messier.

## 2011-07-12 ENCOUNTER — Telehealth: Payer: Self-pay | Admitting: *Deleted

## 2011-07-12 ENCOUNTER — Ambulatory Visit: Payer: Medicare Other

## 2011-07-12 NOTE — Telephone Encounter (Signed)
Called patient to inform that document is ready for pick-up, lvm for a return call

## 2011-07-14 NOTE — Progress Notes (Signed)
On 05/25/11, Kara Diaz underwent simulation and treatment planning.  She was placed in the PRONE position on a prone breast board.  2 mlcs were fashioned which comprise 2 complex treatment devices.

## 2011-07-22 DIAGNOSIS — I2510 Atherosclerotic heart disease of native coronary artery without angina pectoris: Secondary | ICD-10-CM | POA: Diagnosis not present

## 2011-07-22 DIAGNOSIS — E7800 Pure hypercholesterolemia, unspecified: Secondary | ICD-10-CM | POA: Diagnosis not present

## 2011-07-22 DIAGNOSIS — I10 Essential (primary) hypertension: Secondary | ICD-10-CM | POA: Diagnosis not present

## 2011-07-28 ENCOUNTER — Ambulatory Visit: Payer: Medicare Other | Admitting: Oncology

## 2011-07-28 ENCOUNTER — Other Ambulatory Visit: Payer: Medicare Other | Admitting: Lab

## 2011-08-03 ENCOUNTER — Other Ambulatory Visit: Payer: Medicare Other | Admitting: Lab

## 2011-09-02 ENCOUNTER — Ambulatory Visit: Payer: Medicare Other | Admitting: Oncology

## 2011-09-08 ENCOUNTER — Ambulatory Visit
Admission: RE | Admit: 2011-09-08 | Discharge: 2011-09-08 | Disposition: A | Discharge status: Home / Self Care | Payer: Medicare Other | Source: Ambulatory Visit | Attending: Radiation Oncology | Admitting: Radiation Oncology

## 2011-09-08 ENCOUNTER — Encounter: Payer: Self-pay | Admitting: Radiation Oncology

## 2011-09-08 ENCOUNTER — Ambulatory Visit: Payer: Medicare Other | Admitting: Radiation Oncology

## 2011-09-08 VITALS — BP 167/79 | HR 72 | Temp 98.2°F | Resp 18 | Wt 209.8 lb

## 2011-09-08 DIAGNOSIS — D0510 Intraductal carcinoma in situ of unspecified breast: Secondary | ICD-10-CM

## 2011-09-08 NOTE — Progress Notes (Signed)
Department of Radiation Oncology  Phone:  [phone] Fax:        [phone]   Name: Kara Diaz   DOB: [birth year]  MRN: [id number]    Date: 09/08/2011  Follow Up Visit Note  Diagnosis: DCIS of the left breast  Interval since last radiation: 1 month  Interval History: Kara Diaz presents today for routine followup.  She is feeling well and doing well. She had to reschedule her knee surgery due to a death in the family so she is not having that until November.  She is feeling "blah" but not depressed.  She says she comes in and out of these moods in her life.  She is interested in possibly getting involved in some activities at the cancer center. She is using her radiaplex on her left breast. She is tolerating her Aromasin without any side effects.  Allergies: No Known Allergies  Medications:  Current Outpatient Prescriptions  Medication Sig Dispense Refill  . amLODipine-benazepril (LOTREL) 10-20 MG per capsule Take 1 capsule by mouth daily.      Marland Kitchen aspirin EC 81 MG tablet Take 81 mg by mouth daily.      Marland Kitchen b complex vitamins tablet Take 1 tablet by mouth daily.      . Calcium 600-200 MG-UNIT per tablet Take 1 tablet by mouth daily.      . Cholecalciferol (VITAMIN D3) 2000 UNITS TABS Take 1 tablet by mouth daily.      . Cobalamine Combinations (FOLIC + B12 PO) Take by mouth.      . cycloSPORINE (RESTASIS) 0.05 % ophthalmic emulsion Place 1 drop into both eyes 2 (two) times daily.      . hyaluronate sodium (RADIAPLEXRX) GEL Apply topically 2 (two) times daily.      Marland Kitchen HYDROcodone-acetaminophen (NORCO) 10-325 MG per tablet Take 1 tablet by mouth every 4 (four) hours as needed for pain.  30 tablet  0  . Magnesium 400 MG CAPS Take 1 tablet by mouth daily.      . Multiple Vitamins-Minerals (MULTIVITAMINS THER. W/MINERALS) TABS Take 1 tablet by mouth daily.      . nitroGLYCERIN (NITROSTAT) 0.4 MG SL tablet Place 0.4 mg under the tongue every 5 (five) minutes as needed. For chest  pain.      . Omega-3 Fatty Acids (FISH OIL) 1000 MG CAPS Take 1 capsule by mouth daily.      Marland Kitchen OVER THE COUNTER MEDICATION Take 2 tablets by mouth daily. Circulation and vein support for healthy legs.      . potassium chloride SA (K-DUR,KLOR-CON) 20 MEQ tablet Take 20 mEq by mouth daily.      Marland Kitchen triamterene-hydrochlorothiazide (DYAZIDE) 37.5-25 MG per capsule Take 1 capsule by mouth every morning.      . vitamin C (ASCORBIC ACID) 500 MG tablet Take 500 mg by mouth daily.      Marland Kitchen GLUCOSAMINE-CHONDROITIN PO Take 5 capsules by mouth daily.      . non-metallic deodorant Thornton Papas) MISC Apply 1 application topically daily as needed.        Physical Exam:   weight is 209 lb 12.8 oz (95.165 kg). Her oral temperature is 98.2 F (36.8 C). Her blood pressure is 167/79 and her pulse is 72. Her respiration is 18.  Her skin is well healed.  She has some darkness superiorly and medially. Her inframammary folds are intact.  IMPRESSION: Kara Diaz is a 71 y.o. female status post lumpectomy and radiation for DCIS of the left breast with resolving  acute effects of treatment  PLAN:  Kara Diaz looks great. She's tolerated her treatment well and is healing up nicely. She is tolerating her Aromasin well. It sounds like she needs to get involved in some activities and I have asked our chaplain to give her a call with the September schedule. I will see her back in 9 months just to check on her make sure things are going okay.    Kara Hare, MD

## 2011-09-08 NOTE — Progress Notes (Signed)
Patient presents to the clinic today unaccompanied for a follow up appointment with Dr. Michell Heinrich. Patient is alert and oriented to person, place, and time. No distress noted. Steady gait noted. Pleasant affect noted. Patient denies pain at this time. Patient denies breast pain at this time. Patient denies nipple discharge. Hyperpigmentation without desquamation of left/treated breast noted. Patient reports that she continues to use radiaplex on her left breast along with skin so soft. Patient denies nausea, vomiting, headache, dizziness, and diarrhea. Patient reports a good appetite. Slight increase in weight noted. Patient reports a tentative date for knee surgery in November. Reported all findings to Dr. Michell Heinrich.  Patient to follow up with Dr. Park Breed on 9/25

## 2011-09-13 ENCOUNTER — Other Ambulatory Visit: Payer: Self-pay | Admitting: Internal Medicine

## 2011-09-14 ENCOUNTER — Encounter: Payer: Self-pay | Admitting: Specialist

## 2011-09-14 NOTE — Progress Notes (Signed)
At Dr. Luciano Cutter request, I have mailed a September support program calendar and arts program information to Ms. Gali.

## 2011-09-15 DIAGNOSIS — E7800 Pure hypercholesterolemia, unspecified: Secondary | ICD-10-CM | POA: Diagnosis not present

## 2011-09-15 DIAGNOSIS — R7989 Other specified abnormal findings of blood chemistry: Secondary | ICD-10-CM | POA: Diagnosis not present

## 2011-10-05 ENCOUNTER — Encounter: Payer: Self-pay | Admitting: Oncology

## 2011-10-05 ENCOUNTER — Telehealth: Payer: Self-pay | Admitting: Oncology

## 2011-10-05 ENCOUNTER — Ambulatory Visit (HOSPITAL_BASED_OUTPATIENT_CLINIC_OR_DEPARTMENT_OTHER): Payer: Medicare Other | Admitting: Oncology

## 2011-10-05 ENCOUNTER — Other Ambulatory Visit (HOSPITAL_BASED_OUTPATIENT_CLINIC_OR_DEPARTMENT_OTHER): Payer: Medicare Other | Admitting: Lab

## 2011-10-05 VITALS — BP 168/85 | HR 76 | Temp 98.5°F | Resp 20 | Ht 64.0 in | Wt 207.2 lb

## 2011-10-05 DIAGNOSIS — R7989 Other specified abnormal findings of blood chemistry: Secondary | ICD-10-CM | POA: Diagnosis not present

## 2011-10-05 DIAGNOSIS — D0510 Intraductal carcinoma in situ of unspecified breast: Secondary | ICD-10-CM

## 2011-10-05 DIAGNOSIS — D0590 Unspecified type of carcinoma in situ of unspecified breast: Secondary | ICD-10-CM

## 2011-10-05 DIAGNOSIS — I10 Essential (primary) hypertension: Secondary | ICD-10-CM | POA: Diagnosis not present

## 2011-10-05 DIAGNOSIS — E559 Vitamin D deficiency, unspecified: Secondary | ICD-10-CM

## 2011-10-05 DIAGNOSIS — E7800 Pure hypercholesterolemia, unspecified: Secondary | ICD-10-CM | POA: Diagnosis not present

## 2011-10-05 LAB — CBC WITH DIFFERENTIAL/PLATELET
BASO%: 0.4 % (ref 0.0–2.0)
Basophils Absolute: 0 10*3/uL (ref 0.0–0.1)
EOS%: 2.1 % (ref 0.0–7.0)
HCT: 41.3 % (ref 34.8–46.6)
HGB: 13.9 g/dL (ref 11.6–15.9)
LYMPH%: 34.1 % (ref 14.0–49.7)
MCH: 32.3 pg (ref 25.1–34.0)
MCHC: 33.7 g/dL (ref 31.5–36.0)
MCV: 95.9 fL (ref 79.5–101.0)
MONO%: 9.3 % (ref 0.0–14.0)
NEUT%: 54.1 % (ref 38.4–76.8)
Platelets: 351 10*3/uL (ref 145–400)

## 2011-10-05 LAB — COMPREHENSIVE METABOLIC PANEL (CC13)
Albumin: 4.1 g/dL (ref 3.5–5.0)
Alkaline Phosphatase: 80 U/L (ref 40–150)
BUN: 16 mg/dL (ref 7.0–26.0)
Creatinine: 0.8 mg/dL (ref 0.6–1.1)
Glucose: 93 mg/dl (ref 70–99)
Potassium: 3.5 mEq/L (ref 3.5–5.1)
Total Bilirubin: 1.2 mg/dL (ref 0.20–1.20)

## 2011-10-05 NOTE — Telephone Encounter (Signed)
gv pt appt schedule for march 2014 °

## 2011-10-05 NOTE — Patient Instructions (Addendum)
Doing well  You may proceed with your knee surgery as scheduled  I will continue to follow you every 6 months.  Please call with any problems

## 2011-10-05 NOTE — Progress Notes (Signed)
OFFICE PROGRESS NOTE  CC  DEAN, ERIC, MD [address] [place] [address] Dr. Harriette Bouillon Dr. 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 The final pathology revealed high-grade DCIS measuring 0.5 cm. The tumor was ER +80% PR +70%.  #2 patient will complete radiation therapy next week on 07/11/2011.  #3 patient will begin antiestrogen therapy with Aromasin 25 mg daily as a chemoprevention for ipsilateral and contralateral breasts On 07/14/2011.. A total of 5 years of therapy is planned.  CURRENT THERAPY: Aromasin 25 mg daily to begin on 07/14/2011.  INTERVAL HISTORY: Kara Diaz 71 y.o. female returns for Followup visit. Overall patient is doing well she is tolerating Aromasin quite nicely. This was her 3 month visit. She does have some aches and pains but she also has underlying arthritis. She denies any nausea vomiting fevers chills night sweats headaches she occasionally does have hot flashes. They're not incapacitating. She has no vaginal bleeding or discharge. No abdominal pain or diarrhea no peripheral paresthesias. Remainder of the 10 point review of systems is negative.   MEDICAL HISTORY: Past Medical History  Diagnosis Date  . Angina   . PONV (postoperative nausea and vomiting)   . Hypertension     takes Dyazide and Lotrel daily  . Hyperlipidemia     doesn't take any meds for this  . Pneumonia     hx of in 1992  . Joint pain   . Joint swelling   . Arthritis     all over  . Hemorrhoids   . Urinary frequency   . Early cataracts, bilateral   . Insomnia     but doesn't take anything for this  . Breast cancer 03/2011    high grade ductal carcinoma in situ left breast    ALLERGIES:   has no known allergies.  MEDICATIONS:  Current Outpatient Prescriptions  Medication Sig Dispense Refill  .  amLODipine-benazepril (LOTREL) 10-20 MG per capsule Take 1 capsule by mouth daily.      Marland Kitchen aspirin EC 81 MG tablet Take 81 mg by mouth daily.      Marland Kitchen b complex vitamins tablet Take 1 tablet by mouth daily.      . Calcium 600-200 MG-UNIT per tablet Take 1 tablet by mouth daily.      . Cholecalciferol (VITAMIN D3) 2000 UNITS TABS Take 1 tablet by mouth daily.      . Cobalamine Combinations (FOLIC + B12 PO) Take by mouth.      . cycloSPORINE (RESTASIS) 0.05 % ophthalmic emulsion Place 1 drop into both eyes 2 (two) times daily.      Marland Kitchen exemestane (AROMASIN) 25 MG tablet Take 25 mg by mouth daily after breakfast.      . GLUCOSAMINE-CHONDROITIN PO Take 5 capsules by mouth daily.      . hyaluronate sodium (RADIAPLEXRX) GEL Apply topically 2 (two) times daily.      Marland Kitchen HYDROcodone-acetaminophen (NORCO) 10-325 MG per tablet Take 1 tablet by mouth every 4 (four) hours as needed for pain.  30 tablet  0  . Magnesium 400 MG CAPS Take 1 tablet by mouth daily.      . Multiple Vitamins-Minerals (MULTIVITAMINS THER. W/MINERALS) TABS Take 1 tablet by mouth daily.      . nitroGLYCERIN (NITROSTAT) 0.4 MG SL tablet Place 0.4 mg under the tongue every 5 (five)  minutes as needed. For chest pain.      . non-metallic deodorant Thornton Papas) MISC Apply 1 application topically daily as needed.      . Omega-3 Fatty Acids (FISH OIL) 1000 MG CAPS Take 1 capsule by mouth daily.      Marland Kitchen OVER THE COUNTER MEDICATION Take 2 tablets by mouth daily. Circulation and vein support for healthy legs.      . potassium chloride SA (K-DUR,KLOR-CON) 20 MEQ tablet Take 20 mEq by mouth daily.      Marland Kitchen triamterene-hydrochlorothiazide (DYAZIDE) 37.5-25 MG per capsule Take 1 capsule by mouth every morning.      . vitamin C (ASCORBIC ACID) 500 MG tablet Take 500 mg by mouth daily.        SURGICAL HISTORY:  Past Surgical History  Procedure Date  . Rotator cuff surgery 1998    right  . Tonsillectomy     as a child  . Myomectomy     early 90's  .  Cholecystectomy late 90's  . Cardiac catheterization 12/16/10  . Colonoscopy   . Breast lumpectomy     left breast lumpectomy  . Abdominal hysterectomy 1980    partial hysterectomy    REVIEW OF SYSTEMS:  Pertinent items are noted in HPI.   PHYSICAL EXAMINATION: General appearance: alert, cooperative and appears stated age Lymph nodes: Cervical, supraclavicular, and axillary nodes normal. Resp: clear to auscultation bilaterally and normal percussion bilaterally Back: symmetric, no curvature. ROM normal. No CVA tenderness. Cardio: regular rate and rhythm, S1, S2 normal, no murmur, click, rub or gallop GI: soft, non-tender; bowel sounds normal; no masses,  no organomegaly Extremities: extremities normal, atraumatic, no cyanosis or edema Neurologic: Grossly normal  ECOG PERFORMANCE STATUS: 1 - Symptomatic but completely ambulatory  Blood pressure 168/85, pulse 76, temperature 98.5 F (36.9 C), temperature source Oral, resp. rate 20, height 5\' 4"  (1.626 m), weight 207 lb 3.2 oz (93.985 kg).  LABORATORY DATA: Lab Results  Component Value Date   WBC 3.3* 10/05/2011   HGB 13.9 10/05/2011   HCT 41.3 10/05/2011   MCV 95.9 10/05/2011   PLT 351 10/05/2011      Chemistry      Component Value Date/Time   NA 141 05/13/2011 1357   K 3.8 05/13/2011 1357   CL 102 05/13/2011 1357   CO2 31 05/13/2011 1357   BUN 18 05/13/2011 1357   CREATININE 0.55 05/13/2011 1357      Component Value Date/Time   CALCIUM 10.1 05/13/2011 1357   ALKPHOS 91 05/13/2011 1357   AST 31 05/13/2011 1357   ALT 22 05/13/2011 1357   BILITOT 0.6 05/13/2011 1357     Diagnosis Breast, lumpectomy, Left - RESIDUAL INTERMEDIATE TO HIGH GRADE DCIS, APPROXIMATELY 0.5 CM IN DIMENSION, STATUS POST BIOPSY. - SEE COMMENT. Microscopic Comment BREAST, IN SITU CARCINOMA Specimen, including laterality: Left breast. Procedure: Excision with wire guided localization. Grade of carcinoma: Intermediate to high grade. Necrosis: Present. Estimated  tumor size: (gross measurement): 0.5 cm Treatment effect: N/A Distance to closest margin: Intermediate grade DCIS comes to within 0.4 cm of inferior margin (closest margin). Remaining margins 1 cm or greater. Breast prognostic profile: Estrogen receptor: 8% ([id number]) Progesterone receptor: 7% ([id number]) Lymph nodes: None received. TNM: pTis, pNX Comments: Sections show a stellate fibrotic scar area containing foreign material consistent with a previous biopsy site. At the periphery of the scar, there are multiple foci of intermediate to high grade DCIS with focal necrosis. The DCIS is present in four of five  paraffin blocks representing the entirety of the scar/biopsy site and spans a distance of approximately 0.5 cm (estimated gross measurement). Additional clinical correlation with radiographic studies is recommended for the estimation of the lesional size. ER/PR studies were performed on the recent biopsy specimen ([id number]). (RM:gt, 05/10/11) Lloyd Huger M.D. Ph.D. Pathologist, Electronic Signature (Case signed 05/10/2011) Specimen Gross and Clinical Information 1 of  ASSESSMENT: 71 year old female with:  1.  high-grade DCIS measuring about 0.5 cm. She is now status post lumpectomy. Postoperatively she is doing well. Will now complete radiation therapy to the left breast on 07/11/2011. Overall she has tolerated the radiation well. After this she will begin Aromasin 25 mg daily beginning 07/14/2011. Risks and benefits of Aromasin were discussed with her as well as literature was given to her. Patient is doing well no evidence of recurrent disease.  PLAN:  #1 patient will proceed with her knee surgery.  #2 she will continue to take Aromasin. A total of 5 years of therapy is planned.  #3 she will be seen back in 6 months time or sooner if need arises.  All questions were answered. The patient knows to call the clinic with any problems, questions or concerns. We can certainly  see the patient much sooner if necessary.  I spent 25 minutes counseling the patient face to face. The total time spent in the appointment was 30 minutes.    Drue Second, MD Medical/Oncology [facility] [phone] (beeper) [phone] (Office)  10/05/2011, 11:10 AM

## 2011-10-06 LAB — LIPID PANEL
HDL: 68 mg/dL (ref 35–70)
LDL Cholesterol: 191 mg/dL
LDl/HDL Ratio: 4.1
Triglycerides: 97 mg/dL (ref 40–160)

## 2011-10-06 LAB — HEMOGLOBIN A1C: Hgb A1c MFr Bld: 5.2 % (ref 4.0–6.0)

## 2011-10-21 DIAGNOSIS — E7800 Pure hypercholesterolemia, unspecified: Secondary | ICD-10-CM | POA: Diagnosis not present

## 2011-10-21 DIAGNOSIS — I2510 Atherosclerotic heart disease of native coronary artery without angina pectoris: Secondary | ICD-10-CM | POA: Diagnosis not present

## 2011-10-21 DIAGNOSIS — I10 Essential (primary) hypertension: Secondary | ICD-10-CM | POA: Diagnosis not present

## 2011-10-25 DIAGNOSIS — Z01810 Encounter for preprocedural cardiovascular examination: Secondary | ICD-10-CM | POA: Diagnosis not present

## 2011-10-25 DIAGNOSIS — I10 Essential (primary) hypertension: Secondary | ICD-10-CM | POA: Diagnosis not present

## 2011-10-31 ENCOUNTER — Telehealth: Payer: Self-pay | Admitting: *Deleted

## 2011-10-31 NOTE — Telephone Encounter (Signed)
Pt called states " I'm having knee surgery at [facility] and they told me I needed blood work because my white count was low." Pt has concerns as to why a repeat on labs as she just had labs on 09/25. Reviewed with pt to speak with [facility] as they are requesting these labs. We can fax lab results if needed.

## 2011-11-04 ENCOUNTER — Telehealth: Payer: Self-pay | Admitting: *Deleted

## 2011-11-04 ENCOUNTER — Other Ambulatory Visit: Payer: Self-pay | Admitting: Oncology

## 2011-11-04 DIAGNOSIS — D0510 Intraductal carcinoma in situ of unspecified breast: Secondary | ICD-10-CM

## 2011-11-04 NOTE — Telephone Encounter (Signed)
Left voice message to inform the patient of the lab only appointment on 11-07-2011 

## 2011-11-07 ENCOUNTER — Other Ambulatory Visit: Payer: BLUE CROSS/BLUE SHIELD | Admitting: Lab

## 2011-11-07 ENCOUNTER — Telehealth: Payer: Self-pay | Admitting: *Deleted

## 2011-11-07 NOTE — Telephone Encounter (Signed)
Per pt she missed lab appt today 11/07/11. Pt would like to r/s for 10/29. onc tx sent

## 2011-11-07 NOTE — Telephone Encounter (Signed)
LEFT VOICE MESSAGE TO INFORM THE P ATIENT OF THE LAB ONLY APPOINTMENT

## 2011-11-08 ENCOUNTER — Other Ambulatory Visit (HOSPITAL_BASED_OUTPATIENT_CLINIC_OR_DEPARTMENT_OTHER): Payer: Medicare Other | Admitting: Lab

## 2011-11-08 DIAGNOSIS — D0590 Unspecified type of carcinoma in situ of unspecified breast: Secondary | ICD-10-CM | POA: Diagnosis not present

## 2011-11-08 DIAGNOSIS — D0510 Intraductal carcinoma in situ of unspecified breast: Secondary | ICD-10-CM

## 2011-11-08 LAB — CBC WITH DIFFERENTIAL/PLATELET
Basophils Absolute: 0 10*3/uL (ref 0.0–0.1)
Eosinophils Absolute: 0.1 10*3/uL (ref 0.0–0.5)
HCT: 37.6 % (ref 34.8–46.6)
HGB: 13.1 g/dL (ref 11.6–15.9)
MCV: 95.6 fL (ref 79.5–101.0)
MONO%: 8.6 % (ref 0.0–14.0)
NEUT#: 1.4 10*3/uL — ABNORMAL LOW (ref 1.5–6.5)
NEUT%: 48.9 % (ref 38.4–76.8)
RDW: 13 % (ref 11.2–14.5)

## 2011-11-11 HISTORY — PX: JOINT REPLACEMENT: SHX530

## 2011-11-16 ENCOUNTER — Telehealth: Payer: Self-pay | Admitting: *Deleted

## 2011-11-16 NOTE — Telephone Encounter (Signed)
Per MD, labs look good. Notified pt, who verbalized understanding. No further concerns

## 2011-11-16 NOTE — Telephone Encounter (Signed)
Message copied by Cooper Render on Wed Nov 16, 2011  1:26 PM ------      Message from: Lorenza Evangelist A      Created: Wed Nov 16, 2011 10:26 AM       Patient called asking for lab results from 11/08/11 visit.            CBC seems to be the only results we have.            Thanks       [employer]

## 2011-11-21 DIAGNOSIS — IMO0002 Reserved for concepts with insufficient information to code with codable children: Secondary | ICD-10-CM | POA: Diagnosis not present

## 2011-11-21 DIAGNOSIS — Z471 Aftercare following joint replacement surgery: Secondary | ICD-10-CM | POA: Diagnosis not present

## 2011-11-21 DIAGNOSIS — Z96659 Presence of unspecified artificial knee joint: Secondary | ICD-10-CM | POA: Diagnosis not present

## 2011-11-21 DIAGNOSIS — Z5189 Encounter for other specified aftercare: Secondary | ICD-10-CM | POA: Diagnosis not present

## 2011-11-21 DIAGNOSIS — Z853 Personal history of malignant neoplasm of breast: Secondary | ICD-10-CM | POA: Diagnosis not present

## 2011-11-21 DIAGNOSIS — I10 Essential (primary) hypertension: Secondary | ICD-10-CM | POA: Diagnosis not present

## 2011-11-21 DIAGNOSIS — K5900 Constipation, unspecified: Secondary | ICD-10-CM | POA: Diagnosis not present

## 2011-11-21 DIAGNOSIS — M1710 Unilateral primary osteoarthritis, unspecified knee: Secondary | ICD-10-CM | POA: Diagnosis not present

## 2011-11-21 DIAGNOSIS — H2510 Age-related nuclear cataract, unspecified eye: Secondary | ICD-10-CM | POA: Diagnosis present

## 2011-11-21 DIAGNOSIS — G8918 Other acute postprocedural pain: Secondary | ICD-10-CM | POA: Diagnosis not present

## 2011-11-21 DIAGNOSIS — Z87891 Personal history of nicotine dependence: Secondary | ICD-10-CM | POA: Diagnosis not present

## 2011-11-21 DIAGNOSIS — E669 Obesity, unspecified: Secondary | ICD-10-CM | POA: Diagnosis not present

## 2011-11-21 DIAGNOSIS — M129 Arthropathy, unspecified: Secondary | ICD-10-CM | POA: Diagnosis not present

## 2011-11-21 DIAGNOSIS — D62 Acute posthemorrhagic anemia: Secondary | ICD-10-CM | POA: Diagnosis not present

## 2011-11-24 DIAGNOSIS — I10 Essential (primary) hypertension: Secondary | ICD-10-CM | POA: Diagnosis not present

## 2011-11-24 DIAGNOSIS — Z7901 Long term (current) use of anticoagulants: Secondary | ICD-10-CM | POA: Diagnosis not present

## 2011-11-24 DIAGNOSIS — R269 Unspecified abnormalities of gait and mobility: Secondary | ICD-10-CM | POA: Diagnosis not present

## 2011-11-24 DIAGNOSIS — K5900 Constipation, unspecified: Secondary | ICD-10-CM | POA: Diagnosis not present

## 2011-11-24 DIAGNOSIS — Z853 Personal history of malignant neoplasm of breast: Secondary | ICD-10-CM | POA: Diagnosis not present

## 2011-11-24 DIAGNOSIS — M1990 Unspecified osteoarthritis, unspecified site: Secondary | ICD-10-CM | POA: Diagnosis not present

## 2011-11-24 DIAGNOSIS — Z5189 Encounter for other specified aftercare: Secondary | ICD-10-CM | POA: Diagnosis not present

## 2011-11-24 DIAGNOSIS — Z471 Aftercare following joint replacement surgery: Secondary | ICD-10-CM | POA: Diagnosis not present

## 2011-11-24 DIAGNOSIS — E876 Hypokalemia: Secondary | ICD-10-CM | POA: Diagnosis not present

## 2011-11-24 DIAGNOSIS — D62 Acute posthemorrhagic anemia: Secondary | ICD-10-CM | POA: Diagnosis not present

## 2011-11-24 DIAGNOSIS — M1710 Unilateral primary osteoarthritis, unspecified knee: Secondary | ICD-10-CM | POA: Diagnosis not present

## 2011-11-24 DIAGNOSIS — Z96659 Presence of unspecified artificial knee joint: Secondary | ICD-10-CM | POA: Diagnosis not present

## 2011-11-25 DIAGNOSIS — I10 Essential (primary) hypertension: Secondary | ICD-10-CM | POA: Diagnosis not present

## 2011-11-25 DIAGNOSIS — Z7901 Long term (current) use of anticoagulants: Secondary | ICD-10-CM | POA: Diagnosis not present

## 2011-11-25 DIAGNOSIS — M1990 Unspecified osteoarthritis, unspecified site: Secondary | ICD-10-CM | POA: Diagnosis not present

## 2011-11-25 DIAGNOSIS — Z853 Personal history of malignant neoplasm of breast: Secondary | ICD-10-CM | POA: Diagnosis not present

## 2011-11-29 DIAGNOSIS — Z7901 Long term (current) use of anticoagulants: Secondary | ICD-10-CM | POA: Diagnosis not present

## 2011-11-29 DIAGNOSIS — M1990 Unspecified osteoarthritis, unspecified site: Secondary | ICD-10-CM | POA: Diagnosis not present

## 2011-11-29 DIAGNOSIS — Z853 Personal history of malignant neoplasm of breast: Secondary | ICD-10-CM | POA: Diagnosis not present

## 2011-11-29 DIAGNOSIS — I10 Essential (primary) hypertension: Secondary | ICD-10-CM | POA: Diagnosis not present

## 2011-11-30 DIAGNOSIS — E876 Hypokalemia: Secondary | ICD-10-CM | POA: Diagnosis not present

## 2011-11-30 DIAGNOSIS — D62 Acute posthemorrhagic anemia: Secondary | ICD-10-CM | POA: Diagnosis not present

## 2011-11-30 DIAGNOSIS — I10 Essential (primary) hypertension: Secondary | ICD-10-CM | POA: Diagnosis not present

## 2011-12-02 DIAGNOSIS — Z7901 Long term (current) use of anticoagulants: Secondary | ICD-10-CM | POA: Diagnosis not present

## 2011-12-02 DIAGNOSIS — Z853 Personal history of malignant neoplasm of breast: Secondary | ICD-10-CM | POA: Diagnosis not present

## 2011-12-02 DIAGNOSIS — I10 Essential (primary) hypertension: Secondary | ICD-10-CM | POA: Diagnosis not present

## 2011-12-02 DIAGNOSIS — M1990 Unspecified osteoarthritis, unspecified site: Secondary | ICD-10-CM | POA: Diagnosis not present

## 2011-12-05 DIAGNOSIS — I10 Essential (primary) hypertension: Secondary | ICD-10-CM | POA: Diagnosis not present

## 2011-12-05 DIAGNOSIS — M1990 Unspecified osteoarthritis, unspecified site: Secondary | ICD-10-CM | POA: Diagnosis not present

## 2011-12-05 DIAGNOSIS — Z7901 Long term (current) use of anticoagulants: Secondary | ICD-10-CM | POA: Diagnosis not present

## 2011-12-05 DIAGNOSIS — Z853 Personal history of malignant neoplasm of breast: Secondary | ICD-10-CM | POA: Diagnosis not present

## 2011-12-13 DIAGNOSIS — M1990 Unspecified osteoarthritis, unspecified site: Secondary | ICD-10-CM | POA: Diagnosis not present

## 2011-12-13 DIAGNOSIS — Z7901 Long term (current) use of anticoagulants: Secondary | ICD-10-CM | POA: Diagnosis not present

## 2011-12-13 DIAGNOSIS — I10 Essential (primary) hypertension: Secondary | ICD-10-CM | POA: Diagnosis not present

## 2011-12-13 DIAGNOSIS — Z853 Personal history of malignant neoplasm of breast: Secondary | ICD-10-CM | POA: Diagnosis not present

## 2011-12-22 DIAGNOSIS — Z7982 Long term (current) use of aspirin: Secondary | ICD-10-CM | POA: Diagnosis not present

## 2011-12-22 DIAGNOSIS — Z471 Aftercare following joint replacement surgery: Secondary | ICD-10-CM | POA: Diagnosis not present

## 2011-12-22 DIAGNOSIS — Z96659 Presence of unspecified artificial knee joint: Secondary | ICD-10-CM | POA: Diagnosis not present

## 2011-12-22 DIAGNOSIS — Z853 Personal history of malignant neoplasm of breast: Secondary | ICD-10-CM | POA: Diagnosis not present

## 2011-12-22 DIAGNOSIS — I10 Essential (primary) hypertension: Secondary | ICD-10-CM | POA: Diagnosis not present

## 2011-12-23 DIAGNOSIS — Z471 Aftercare following joint replacement surgery: Secondary | ICD-10-CM | POA: Diagnosis not present

## 2011-12-23 DIAGNOSIS — Z96659 Presence of unspecified artificial knee joint: Secondary | ICD-10-CM | POA: Diagnosis not present

## 2011-12-23 DIAGNOSIS — I10 Essential (primary) hypertension: Secondary | ICD-10-CM | POA: Diagnosis not present

## 2011-12-23 DIAGNOSIS — Z853 Personal history of malignant neoplasm of breast: Secondary | ICD-10-CM | POA: Diagnosis not present

## 2011-12-23 DIAGNOSIS — Z7982 Long term (current) use of aspirin: Secondary | ICD-10-CM | POA: Diagnosis not present

## 2011-12-26 ENCOUNTER — Ambulatory Visit (INDEPENDENT_AMBULATORY_CARE_PROVIDER_SITE_OTHER): Payer: Medicare Other | Admitting: Surgery

## 2011-12-26 DIAGNOSIS — I10 Essential (primary) hypertension: Secondary | ICD-10-CM | POA: Diagnosis not present

## 2011-12-26 DIAGNOSIS — Z471 Aftercare following joint replacement surgery: Secondary | ICD-10-CM | POA: Diagnosis not present

## 2011-12-26 DIAGNOSIS — Z7982 Long term (current) use of aspirin: Secondary | ICD-10-CM | POA: Diagnosis not present

## 2011-12-26 DIAGNOSIS — Z853 Personal history of malignant neoplasm of breast: Secondary | ICD-10-CM | POA: Diagnosis not present

## 2011-12-26 DIAGNOSIS — Z96659 Presence of unspecified artificial knee joint: Secondary | ICD-10-CM | POA: Diagnosis not present

## 2011-12-27 DIAGNOSIS — I10 Essential (primary) hypertension: Secondary | ICD-10-CM | POA: Diagnosis not present

## 2011-12-27 DIAGNOSIS — Z7982 Long term (current) use of aspirin: Secondary | ICD-10-CM | POA: Diagnosis not present

## 2011-12-27 DIAGNOSIS — Z853 Personal history of malignant neoplasm of breast: Secondary | ICD-10-CM | POA: Diagnosis not present

## 2011-12-27 DIAGNOSIS — Z471 Aftercare following joint replacement surgery: Secondary | ICD-10-CM | POA: Diagnosis not present

## 2011-12-27 DIAGNOSIS — Z96659 Presence of unspecified artificial knee joint: Secondary | ICD-10-CM | POA: Diagnosis not present

## 2011-12-28 DIAGNOSIS — Z853 Personal history of malignant neoplasm of breast: Secondary | ICD-10-CM | POA: Diagnosis not present

## 2011-12-28 DIAGNOSIS — I10 Essential (primary) hypertension: Secondary | ICD-10-CM | POA: Diagnosis not present

## 2011-12-28 DIAGNOSIS — Z7982 Long term (current) use of aspirin: Secondary | ICD-10-CM | POA: Diagnosis not present

## 2011-12-28 DIAGNOSIS — Z471 Aftercare following joint replacement surgery: Secondary | ICD-10-CM | POA: Diagnosis not present

## 2011-12-28 DIAGNOSIS — Z96659 Presence of unspecified artificial knee joint: Secondary | ICD-10-CM | POA: Diagnosis not present

## 2011-12-29 DIAGNOSIS — I10 Essential (primary) hypertension: Secondary | ICD-10-CM | POA: Diagnosis not present

## 2011-12-29 DIAGNOSIS — Z853 Personal history of malignant neoplasm of breast: Secondary | ICD-10-CM | POA: Diagnosis not present

## 2011-12-29 DIAGNOSIS — Z471 Aftercare following joint replacement surgery: Secondary | ICD-10-CM | POA: Diagnosis not present

## 2011-12-29 DIAGNOSIS — Z96659 Presence of unspecified artificial knee joint: Secondary | ICD-10-CM | POA: Diagnosis not present

## 2011-12-29 DIAGNOSIS — Z7982 Long term (current) use of aspirin: Secondary | ICD-10-CM | POA: Diagnosis not present

## 2011-12-30 DIAGNOSIS — Z853 Personal history of malignant neoplasm of breast: Secondary | ICD-10-CM | POA: Diagnosis not present

## 2011-12-30 DIAGNOSIS — Z96659 Presence of unspecified artificial knee joint: Secondary | ICD-10-CM | POA: Diagnosis not present

## 2011-12-30 DIAGNOSIS — Z471 Aftercare following joint replacement surgery: Secondary | ICD-10-CM | POA: Diagnosis not present

## 2011-12-30 DIAGNOSIS — Z7982 Long term (current) use of aspirin: Secondary | ICD-10-CM | POA: Diagnosis not present

## 2011-12-30 DIAGNOSIS — I10 Essential (primary) hypertension: Secondary | ICD-10-CM | POA: Diagnosis not present

## 2012-01-02 DIAGNOSIS — Z471 Aftercare following joint replacement surgery: Secondary | ICD-10-CM | POA: Diagnosis not present

## 2012-01-02 DIAGNOSIS — I10 Essential (primary) hypertension: Secondary | ICD-10-CM | POA: Diagnosis not present

## 2012-01-02 DIAGNOSIS — Z853 Personal history of malignant neoplasm of breast: Secondary | ICD-10-CM | POA: Diagnosis not present

## 2012-01-02 DIAGNOSIS — Z7982 Long term (current) use of aspirin: Secondary | ICD-10-CM | POA: Diagnosis not present

## 2012-01-02 DIAGNOSIS — Z96659 Presence of unspecified artificial knee joint: Secondary | ICD-10-CM | POA: Diagnosis not present

## 2012-01-03 DIAGNOSIS — I10 Essential (primary) hypertension: Secondary | ICD-10-CM | POA: Diagnosis not present

## 2012-01-03 DIAGNOSIS — Z471 Aftercare following joint replacement surgery: Secondary | ICD-10-CM | POA: Diagnosis not present

## 2012-01-03 DIAGNOSIS — Z7982 Long term (current) use of aspirin: Secondary | ICD-10-CM | POA: Diagnosis not present

## 2012-01-03 DIAGNOSIS — Z96659 Presence of unspecified artificial knee joint: Secondary | ICD-10-CM | POA: Diagnosis not present

## 2012-01-03 DIAGNOSIS — Z853 Personal history of malignant neoplasm of breast: Secondary | ICD-10-CM | POA: Diagnosis not present

## 2012-01-05 DIAGNOSIS — Z471 Aftercare following joint replacement surgery: Secondary | ICD-10-CM | POA: Diagnosis not present

## 2012-01-05 DIAGNOSIS — I10 Essential (primary) hypertension: Secondary | ICD-10-CM | POA: Diagnosis not present

## 2012-01-05 DIAGNOSIS — Z853 Personal history of malignant neoplasm of breast: Secondary | ICD-10-CM | POA: Diagnosis not present

## 2012-01-05 DIAGNOSIS — Z96659 Presence of unspecified artificial knee joint: Secondary | ICD-10-CM | POA: Diagnosis not present

## 2012-01-05 DIAGNOSIS — Z7982 Long term (current) use of aspirin: Secondary | ICD-10-CM | POA: Diagnosis not present

## 2012-01-06 DIAGNOSIS — Z471 Aftercare following joint replacement surgery: Secondary | ICD-10-CM | POA: Diagnosis not present

## 2012-01-06 DIAGNOSIS — Z853 Personal history of malignant neoplasm of breast: Secondary | ICD-10-CM | POA: Diagnosis not present

## 2012-01-06 DIAGNOSIS — Z7982 Long term (current) use of aspirin: Secondary | ICD-10-CM | POA: Diagnosis not present

## 2012-01-06 DIAGNOSIS — I10 Essential (primary) hypertension: Secondary | ICD-10-CM | POA: Diagnosis not present

## 2012-01-06 DIAGNOSIS — Z96659 Presence of unspecified artificial knee joint: Secondary | ICD-10-CM | POA: Diagnosis not present

## 2012-01-16 DIAGNOSIS — E7800 Pure hypercholesterolemia, unspecified: Secondary | ICD-10-CM | POA: Diagnosis not present

## 2012-01-16 DIAGNOSIS — I10 Essential (primary) hypertension: Secondary | ICD-10-CM | POA: Diagnosis not present

## 2012-01-17 ENCOUNTER — Ambulatory Visit: Payer: Medicare Other | Attending: Orthopaedic Surgery

## 2012-01-17 ENCOUNTER — Encounter (INDEPENDENT_AMBULATORY_CARE_PROVIDER_SITE_OTHER): Payer: Self-pay | Admitting: Surgery

## 2012-01-17 DIAGNOSIS — R262 Difficulty in walking, not elsewhere classified: Secondary | ICD-10-CM | POA: Insufficient documentation

## 2012-01-17 DIAGNOSIS — M25569 Pain in unspecified knee: Secondary | ICD-10-CM | POA: Insufficient documentation

## 2012-01-17 DIAGNOSIS — M25669 Stiffness of unspecified knee, not elsewhere classified: Secondary | ICD-10-CM | POA: Diagnosis not present

## 2012-01-17 DIAGNOSIS — IMO0001 Reserved for inherently not codable concepts without codable children: Secondary | ICD-10-CM | POA: Diagnosis not present

## 2012-01-17 DIAGNOSIS — M6281 Muscle weakness (generalized): Secondary | ICD-10-CM | POA: Diagnosis not present

## 2012-01-19 ENCOUNTER — Ambulatory Visit: Payer: Medicare Other | Admitting: Physical Therapy

## 2012-01-20 ENCOUNTER — Ambulatory Visit: Payer: Medicare Other | Admitting: Physical Therapy

## 2012-01-20 DIAGNOSIS — Z471 Aftercare following joint replacement surgery: Secondary | ICD-10-CM | POA: Diagnosis not present

## 2012-01-20 DIAGNOSIS — Z96659 Presence of unspecified artificial knee joint: Secondary | ICD-10-CM | POA: Diagnosis not present

## 2012-01-24 ENCOUNTER — Ambulatory Visit: Payer: Medicare Other

## 2012-01-25 ENCOUNTER — Ambulatory Visit: Payer: Medicare Other

## 2012-01-26 ENCOUNTER — Ambulatory Visit: Payer: Medicare Other

## 2012-01-31 ENCOUNTER — Ambulatory Visit: Payer: Medicare Other | Admitting: Physical Therapy

## 2012-02-02 ENCOUNTER — Ambulatory Visit: Payer: Medicare Other | Admitting: Physical Therapy

## 2012-02-03 ENCOUNTER — Ambulatory Visit: Payer: Medicare Other | Admitting: Physical Therapy

## 2012-02-07 ENCOUNTER — Ambulatory Visit: Payer: Medicare Other

## 2012-02-09 ENCOUNTER — Ambulatory Visit: Payer: Medicare Other

## 2012-02-10 ENCOUNTER — Ambulatory Visit: Payer: Medicare Other | Admitting: Physical Therapy

## 2012-02-14 ENCOUNTER — Ambulatory Visit: Payer: Medicare Other | Attending: Orthopaedic Surgery | Admitting: Physical Therapy

## 2012-02-14 DIAGNOSIS — R262 Difficulty in walking, not elsewhere classified: Secondary | ICD-10-CM | POA: Insufficient documentation

## 2012-02-14 DIAGNOSIS — M6281 Muscle weakness (generalized): Secondary | ICD-10-CM | POA: Insufficient documentation

## 2012-02-14 DIAGNOSIS — M25669 Stiffness of unspecified knee, not elsewhere classified: Secondary | ICD-10-CM | POA: Diagnosis not present

## 2012-02-14 DIAGNOSIS — M25569 Pain in unspecified knee: Secondary | ICD-10-CM | POA: Insufficient documentation

## 2012-02-14 DIAGNOSIS — Z96659 Presence of unspecified artificial knee joint: Secondary | ICD-10-CM | POA: Diagnosis not present

## 2012-02-14 DIAGNOSIS — IMO0001 Reserved for inherently not codable concepts without codable children: Secondary | ICD-10-CM | POA: Insufficient documentation

## 2012-02-16 ENCOUNTER — Ambulatory Visit: Payer: Medicare Other | Admitting: Physical Therapy

## 2012-02-16 DIAGNOSIS — Z96659 Presence of unspecified artificial knee joint: Secondary | ICD-10-CM | POA: Diagnosis not present

## 2012-02-16 DIAGNOSIS — M6281 Muscle weakness (generalized): Secondary | ICD-10-CM | POA: Diagnosis not present

## 2012-02-16 DIAGNOSIS — IMO0001 Reserved for inherently not codable concepts without codable children: Secondary | ICD-10-CM | POA: Diagnosis not present

## 2012-02-16 DIAGNOSIS — M25569 Pain in unspecified knee: Secondary | ICD-10-CM | POA: Diagnosis not present

## 2012-02-16 DIAGNOSIS — M25669 Stiffness of unspecified knee, not elsewhere classified: Secondary | ICD-10-CM | POA: Diagnosis not present

## 2012-02-16 DIAGNOSIS — R262 Difficulty in walking, not elsewhere classified: Secondary | ICD-10-CM | POA: Diagnosis not present

## 2012-02-17 ENCOUNTER — Ambulatory Visit: Payer: Medicare Other | Admitting: Physical Therapy

## 2012-02-17 DIAGNOSIS — M25569 Pain in unspecified knee: Secondary | ICD-10-CM | POA: Diagnosis not present

## 2012-02-17 DIAGNOSIS — Z96659 Presence of unspecified artificial knee joint: Secondary | ICD-10-CM | POA: Diagnosis not present

## 2012-02-17 DIAGNOSIS — IMO0001 Reserved for inherently not codable concepts without codable children: Secondary | ICD-10-CM | POA: Diagnosis not present

## 2012-02-17 DIAGNOSIS — M25669 Stiffness of unspecified knee, not elsewhere classified: Secondary | ICD-10-CM | POA: Diagnosis not present

## 2012-02-17 DIAGNOSIS — M6281 Muscle weakness (generalized): Secondary | ICD-10-CM | POA: Diagnosis not present

## 2012-02-17 DIAGNOSIS — R262 Difficulty in walking, not elsewhere classified: Secondary | ICD-10-CM | POA: Diagnosis not present

## 2012-02-21 ENCOUNTER — Ambulatory Visit: Payer: Medicare Other

## 2012-02-21 DIAGNOSIS — M25669 Stiffness of unspecified knee, not elsewhere classified: Secondary | ICD-10-CM | POA: Diagnosis not present

## 2012-02-21 DIAGNOSIS — R262 Difficulty in walking, not elsewhere classified: Secondary | ICD-10-CM | POA: Diagnosis not present

## 2012-02-21 DIAGNOSIS — M6281 Muscle weakness (generalized): Secondary | ICD-10-CM | POA: Diagnosis not present

## 2012-02-21 DIAGNOSIS — M25569 Pain in unspecified knee: Secondary | ICD-10-CM | POA: Diagnosis not present

## 2012-02-21 DIAGNOSIS — Z96659 Presence of unspecified artificial knee joint: Secondary | ICD-10-CM | POA: Diagnosis not present

## 2012-02-21 DIAGNOSIS — IMO0001 Reserved for inherently not codable concepts without codable children: Secondary | ICD-10-CM | POA: Diagnosis not present

## 2012-02-22 ENCOUNTER — Ambulatory Visit: Payer: Medicare Other | Admitting: Physical Therapy

## 2012-02-23 ENCOUNTER — Ambulatory Visit: Payer: Medicare Other

## 2012-02-28 ENCOUNTER — Ambulatory Visit: Payer: Medicare Other

## 2012-02-28 DIAGNOSIS — Z96659 Presence of unspecified artificial knee joint: Secondary | ICD-10-CM | POA: Diagnosis not present

## 2012-02-28 DIAGNOSIS — M25569 Pain in unspecified knee: Secondary | ICD-10-CM | POA: Diagnosis not present

## 2012-02-28 DIAGNOSIS — M25669 Stiffness of unspecified knee, not elsewhere classified: Secondary | ICD-10-CM | POA: Diagnosis not present

## 2012-02-28 DIAGNOSIS — IMO0001 Reserved for inherently not codable concepts without codable children: Secondary | ICD-10-CM | POA: Diagnosis not present

## 2012-02-28 DIAGNOSIS — R262 Difficulty in walking, not elsewhere classified: Secondary | ICD-10-CM | POA: Diagnosis not present

## 2012-02-28 DIAGNOSIS — M6281 Muscle weakness (generalized): Secondary | ICD-10-CM | POA: Diagnosis not present

## 2012-03-01 ENCOUNTER — Ambulatory Visit: Payer: Medicare Other | Admitting: Physical Therapy

## 2012-03-01 DIAGNOSIS — M25569 Pain in unspecified knee: Secondary | ICD-10-CM | POA: Diagnosis not present

## 2012-03-01 DIAGNOSIS — M6281 Muscle weakness (generalized): Secondary | ICD-10-CM | POA: Diagnosis not present

## 2012-03-01 DIAGNOSIS — Z96659 Presence of unspecified artificial knee joint: Secondary | ICD-10-CM | POA: Diagnosis not present

## 2012-03-01 DIAGNOSIS — IMO0001 Reserved for inherently not codable concepts without codable children: Secondary | ICD-10-CM | POA: Diagnosis not present

## 2012-03-01 DIAGNOSIS — R262 Difficulty in walking, not elsewhere classified: Secondary | ICD-10-CM | POA: Diagnosis not present

## 2012-03-01 DIAGNOSIS — M25669 Stiffness of unspecified knee, not elsewhere classified: Secondary | ICD-10-CM | POA: Diagnosis not present

## 2012-03-02 ENCOUNTER — Ambulatory Visit: Payer: Medicare Other | Admitting: Physical Therapy

## 2012-03-02 DIAGNOSIS — IMO0001 Reserved for inherently not codable concepts without codable children: Secondary | ICD-10-CM | POA: Diagnosis not present

## 2012-03-02 DIAGNOSIS — M25569 Pain in unspecified knee: Secondary | ICD-10-CM | POA: Diagnosis not present

## 2012-03-02 DIAGNOSIS — M25669 Stiffness of unspecified knee, not elsewhere classified: Secondary | ICD-10-CM | POA: Diagnosis not present

## 2012-03-02 DIAGNOSIS — R262 Difficulty in walking, not elsewhere classified: Secondary | ICD-10-CM | POA: Diagnosis not present

## 2012-03-02 DIAGNOSIS — Z96659 Presence of unspecified artificial knee joint: Secondary | ICD-10-CM | POA: Diagnosis not present

## 2012-03-02 DIAGNOSIS — M6281 Muscle weakness (generalized): Secondary | ICD-10-CM | POA: Diagnosis not present

## 2012-03-06 ENCOUNTER — Ambulatory Visit: Payer: Medicare Other | Admitting: Physical Therapy

## 2012-03-06 DIAGNOSIS — IMO0001 Reserved for inherently not codable concepts without codable children: Secondary | ICD-10-CM | POA: Diagnosis not present

## 2012-03-06 DIAGNOSIS — R262 Difficulty in walking, not elsewhere classified: Secondary | ICD-10-CM | POA: Diagnosis not present

## 2012-03-06 DIAGNOSIS — Z96659 Presence of unspecified artificial knee joint: Secondary | ICD-10-CM | POA: Diagnosis not present

## 2012-03-06 DIAGNOSIS — M25569 Pain in unspecified knee: Secondary | ICD-10-CM | POA: Diagnosis not present

## 2012-03-06 DIAGNOSIS — M25669 Stiffness of unspecified knee, not elsewhere classified: Secondary | ICD-10-CM | POA: Diagnosis not present

## 2012-03-06 DIAGNOSIS — M6281 Muscle weakness (generalized): Secondary | ICD-10-CM | POA: Diagnosis not present

## 2012-03-08 ENCOUNTER — Ambulatory Visit: Payer: Medicare Other

## 2012-03-08 DIAGNOSIS — M25569 Pain in unspecified knee: Secondary | ICD-10-CM | POA: Diagnosis not present

## 2012-03-08 DIAGNOSIS — M6281 Muscle weakness (generalized): Secondary | ICD-10-CM | POA: Diagnosis not present

## 2012-03-08 DIAGNOSIS — M25669 Stiffness of unspecified knee, not elsewhere classified: Secondary | ICD-10-CM | POA: Diagnosis not present

## 2012-03-08 DIAGNOSIS — Z96659 Presence of unspecified artificial knee joint: Secondary | ICD-10-CM | POA: Diagnosis not present

## 2012-03-08 DIAGNOSIS — R262 Difficulty in walking, not elsewhere classified: Secondary | ICD-10-CM | POA: Diagnosis not present

## 2012-03-08 DIAGNOSIS — IMO0001 Reserved for inherently not codable concepts without codable children: Secondary | ICD-10-CM | POA: Diagnosis not present

## 2012-03-09 ENCOUNTER — Ambulatory Visit: Payer: Medicare Other | Admitting: Physical Therapy

## 2012-03-09 ENCOUNTER — Encounter: Payer: Self-pay | Admitting: General Practice

## 2012-03-09 DIAGNOSIS — R262 Difficulty in walking, not elsewhere classified: Secondary | ICD-10-CM | POA: Diagnosis not present

## 2012-03-09 DIAGNOSIS — Z96659 Presence of unspecified artificial knee joint: Secondary | ICD-10-CM | POA: Diagnosis not present

## 2012-03-09 DIAGNOSIS — E669 Obesity, unspecified: Secondary | ICD-10-CM | POA: Insufficient documentation

## 2012-03-09 DIAGNOSIS — M6281 Muscle weakness (generalized): Secondary | ICD-10-CM | POA: Diagnosis not present

## 2012-03-09 DIAGNOSIS — IMO0001 Reserved for inherently not codable concepts without codable children: Secondary | ICD-10-CM | POA: Diagnosis not present

## 2012-03-09 DIAGNOSIS — M25569 Pain in unspecified knee: Secondary | ICD-10-CM | POA: Diagnosis not present

## 2012-03-09 DIAGNOSIS — M25669 Stiffness of unspecified knee, not elsewhere classified: Secondary | ICD-10-CM | POA: Diagnosis not present

## 2012-03-13 ENCOUNTER — Ambulatory Visit (INDEPENDENT_AMBULATORY_CARE_PROVIDER_SITE_OTHER): Payer: Medicare Other | Admitting: Surgery

## 2012-03-15 DIAGNOSIS — Z01812 Encounter for preprocedural laboratory examination: Secondary | ICD-10-CM | POA: Diagnosis not present

## 2012-03-15 DIAGNOSIS — M25569 Pain in unspecified knee: Secondary | ICD-10-CM | POA: Diagnosis not present

## 2012-03-15 DIAGNOSIS — Z96659 Presence of unspecified artificial knee joint: Secondary | ICD-10-CM | POA: Diagnosis not present

## 2012-03-15 DIAGNOSIS — I10 Essential (primary) hypertension: Secondary | ICD-10-CM | POA: Diagnosis not present

## 2012-03-15 DIAGNOSIS — Z87891 Personal history of nicotine dependence: Secondary | ICD-10-CM | POA: Diagnosis not present

## 2012-03-15 DIAGNOSIS — Z7982 Long term (current) use of aspirin: Secondary | ICD-10-CM | POA: Diagnosis not present

## 2012-03-15 DIAGNOSIS — Z471 Aftercare following joint replacement surgery: Secondary | ICD-10-CM | POA: Diagnosis not present

## 2012-03-15 DIAGNOSIS — Z7901 Long term (current) use of anticoagulants: Secondary | ICD-10-CM | POA: Diagnosis not present

## 2012-03-16 ENCOUNTER — Ambulatory Visit: Payer: Medicare Other | Admitting: Physical Therapy

## 2012-04-06 ENCOUNTER — Telehealth: Payer: Self-pay | Admitting: Medical Oncology

## 2012-04-06 ENCOUNTER — Encounter: Payer: Self-pay | Admitting: Oncology

## 2012-04-06 ENCOUNTER — Ambulatory Visit (HOSPITAL_BASED_OUTPATIENT_CLINIC_OR_DEPARTMENT_OTHER): Payer: Medicare Other | Admitting: Oncology

## 2012-04-06 ENCOUNTER — Other Ambulatory Visit (HOSPITAL_BASED_OUTPATIENT_CLINIC_OR_DEPARTMENT_OTHER): Payer: Medicare Other

## 2012-04-06 VITALS — BP 181/84 | HR 80 | Temp 98.2°F | Resp 20 | Ht 64.0 in | Wt 199.7 lb

## 2012-04-06 DIAGNOSIS — D0590 Unspecified type of carcinoma in situ of unspecified breast: Secondary | ICD-10-CM | POA: Diagnosis not present

## 2012-04-06 DIAGNOSIS — D0510 Intraductal carcinoma in situ of unspecified breast: Secondary | ICD-10-CM

## 2012-04-06 LAB — COMPREHENSIVE METABOLIC PANEL (CC13)
AST: 16 U/L (ref 5–34)
Albumin: 3.7 g/dL (ref 3.5–5.0)
Alkaline Phosphatase: 92 U/L (ref 40–150)
BUN: 14.4 mg/dL (ref 7.0–26.0)
Calcium: 10.5 mg/dL — ABNORMAL HIGH (ref 8.4–10.4)
Chloride: 106 mEq/L (ref 98–107)
Glucose: 93 mg/dl (ref 70–99)
Potassium: 3.3 mEq/L — ABNORMAL LOW (ref 3.5–5.1)
Sodium: 144 mEq/L (ref 136–145)
Total Protein: 7.5 g/dL (ref 6.4–8.3)

## 2012-04-06 LAB — CBC WITH DIFFERENTIAL/PLATELET
Basophils Absolute: 0 10*3/uL (ref 0.0–0.1)
Eosinophils Absolute: 0.1 10*3/uL (ref 0.0–0.5)
HGB: 13.2 g/dL (ref 11.6–15.9)
MCV: 92.9 fL (ref 79.5–101.0)
MONO%: 6.7 % (ref 0.0–14.0)
NEUT#: 2.6 10*3/uL (ref 1.5–6.5)
RBC: 4.2 10*6/uL (ref 3.70–5.45)
RDW: 13.8 % (ref 11.2–14.5)
WBC: 4 10*3/uL (ref 3.9–10.3)
lymph#: 1 10*3/uL (ref 0.9–3.3)

## 2012-04-06 MED ORDER — EXEMESTANE 25 MG PO TABS: 25.0000 mg | ORAL_TABLET | Freq: Every day | ORAL | Status: DC

## 2012-04-06 NOTE — Patient Instructions (Addendum)
Continue aromasin  I will see you back in 1 year

## 2012-04-06 NOTE — Telephone Encounter (Signed)
Per MD informed patient her potassium results at 3.3, Dr Welton Flakes wants her to take Kdur daily for 5 days. Patient states she takes one pill a day right now. Encouraged pt to add potassium rich foods in her diet, patient states she does do that as well. Mssg sent to MD/NP to review patients comments.

## 2012-04-06 NOTE — Progress Notes (Signed)
Quick Note:  Call patient: K-DUR 20 MEQ PO DAILY X 5 DAYS ______

## 2012-04-06 NOTE — Telephone Encounter (Signed)
Message copied by Rexene Edison on Fri Apr 06, 2012  5:16 PM ------      Message from: Victorino December      Created: Fri Apr 06, 2012  4:53 PM       Call patient: K-DUR 20 MEQ PO DAILY X 5 DAYS ------

## 2012-04-09 ENCOUNTER — Telehealth: Payer: Self-pay | Admitting: *Deleted

## 2012-04-09 NOTE — Telephone Encounter (Signed)
Could nt reach the pt. i will mail a letter/cal concerning her appts.

## 2012-04-09 NOTE — Telephone Encounter (Signed)
Reviewed

## 2012-04-10 ENCOUNTER — Other Ambulatory Visit: Payer: Self-pay | Admitting: Internal Medicine

## 2012-04-10 DIAGNOSIS — Z853 Personal history of malignant neoplasm of breast: Secondary | ICD-10-CM

## 2012-04-13 ENCOUNTER — Encounter (INDEPENDENT_AMBULATORY_CARE_PROVIDER_SITE_OTHER): Payer: Self-pay | Admitting: Surgery

## 2012-04-13 ENCOUNTER — Ambulatory Visit (INDEPENDENT_AMBULATORY_CARE_PROVIDER_SITE_OTHER): Payer: Medicare Other | Admitting: Surgery

## 2012-04-13 VITALS — BP 158/92 | HR 82 | Temp 98.2°F | Resp 16 | Ht 64.0 in | Wt 198.8 lb

## 2012-04-13 DIAGNOSIS — Z853 Personal history of malignant neoplasm of breast: Secondary | ICD-10-CM | POA: Diagnosis not present

## 2012-04-13 NOTE — Progress Notes (Signed)
NAME: Kara Diaz Maya       DOB: 11-15-1940           DATE: 04/13/2012       MRN: [id number]  CC:   Chief Complaint  Patient presents with  . Breast Cancer Long Term Follow Up    [name] is a 72 y.o.Marland Kitchenfemale who presents for routine followup of her Right  Breast DCIS diagnosed in 03/2011 and treated with LUMPECTOMY . She has no problems or concerns on either side.  PFSH: She has had no significant changes since the last visit here.  ROS: There have been no significant changes since the last visit here  EXAM:  VS: BP 158/92  Pulse 82  Temp(Src) 98.2 F (36.8 C) (Temporal)  Resp 16  Ht 5\' 4"  (1.626 m)  Wt 198 lb 12.8 oz (90.175 kg)  BMI 34.11 kg/m2  General: The patient is alert, oriented, generally healthy appearing, NAD. Mood and affect are normal.  Breasts:  right breast scar well healed.  No masses bilaterally.  Pendulous.  Nipples normal bilateral.    Lymphatics: She has no axillary or supraclavicular adenopathy on either side.  Extremities: Full ROM of the surgical side with no lymphedema noted.  Data Reviewed: Dr Welton Flakes notes.  Impression: Doing well, with no evidence of recurrent cancer or new cancer  Plan: Will continue to follow up on an annual basis here.

## 2012-04-13 NOTE — Patient Instructions (Signed)
Return next year.  

## 2012-04-20 ENCOUNTER — Ambulatory Visit
Admission: RE | Admit: 2012-04-20 | Discharge: 2012-04-20 | Disposition: A | Discharge status: Home / Self Care | Payer: Medicare Other | Source: Ambulatory Visit | Attending: Internal Medicine | Admitting: Internal Medicine

## 2012-04-20 DIAGNOSIS — Z853 Personal history of malignant neoplasm of breast: Secondary | ICD-10-CM | POA: Diagnosis not present

## 2012-04-24 DIAGNOSIS — Z6834 Body mass index (BMI) 34.0-34.9, adult: Secondary | ICD-10-CM | POA: Diagnosis not present

## 2012-04-24 DIAGNOSIS — M1710 Unilateral primary osteoarthritis, unspecified knee: Secondary | ICD-10-CM | POA: Diagnosis not present

## 2012-04-24 DIAGNOSIS — Z87891 Personal history of nicotine dependence: Secondary | ICD-10-CM | POA: Diagnosis not present

## 2012-04-24 DIAGNOSIS — Z96659 Presence of unspecified artificial knee joint: Secondary | ICD-10-CM | POA: Diagnosis not present

## 2012-04-24 DIAGNOSIS — Z471 Aftercare following joint replacement surgery: Secondary | ICD-10-CM | POA: Diagnosis not present

## 2012-04-25 DIAGNOSIS — M810 Age-related osteoporosis without current pathological fracture: Secondary | ICD-10-CM | POA: Diagnosis not present

## 2012-04-25 DIAGNOSIS — I10 Essential (primary) hypertension: Secondary | ICD-10-CM | POA: Diagnosis not present

## 2012-04-25 DIAGNOSIS — E785 Hyperlipidemia, unspecified: Secondary | ICD-10-CM | POA: Diagnosis not present

## 2012-04-27 ENCOUNTER — Telehealth: Payer: Self-pay | Admitting: Oncology

## 2012-04-27 NOTE — Telephone Encounter (Signed)
Fax [facility] records [facility]

## 2012-04-30 NOTE — Progress Notes (Signed)
OFFICE PROGRESS NOTE  CC  August Saucer ERIC, MD [facility] [address] [place] [address] Dr. Harriette Bouillon Dr. 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 The final pathology revealed high-grade DCIS measuring 0.5 cm. The tumor was ER +80% PR +70%.  #2 patient will complete radiation therapy next week on 07/11/2011.  #3 patient will begin antiestrogen therapy with Aromasin 25 mg daily as a chemoprevention for ipsilateral and contralateral breasts On 07/14/2011.. A total of 5 years of therapy is planned.  CURRENT THERAPY: Aromasin 25 mg daily to begin on 07/14/2011.  INTERVAL HISTORY: Kara Diaz 72 y.o. female returns for Followup visit. Overall patient is doing well she is tolerating Aromasin quite nicely. This was her 6 month visit. She does have some aches and pains but she also has underlying arthritis. She denies any nausea vomiting fevers chills night sweats headaches she occasionally does have hot flashes. They're not incapacitating. She has no vaginal bleeding or discharge. No abdominal pain or diarrhea no peripheral paresthesias. Remainder of the 10 point review of systems is negative.   MEDICAL HISTORY: Past Medical History  Diagnosis Date  . Angina   . PONV (postoperative nausea and vomiting)   . Hypertension     takes Dyazide and Lotrel daily  . Hyperlipidemia     doesn't take any meds for this  . Pneumonia     hx of in 1992  . Joint pain   . Joint swelling   . Arthritis     all over  . Hemorrhoids   . Urinary frequency   . Early cataracts, bilateral   . Insomnia     but doesn't take anything for this  . Breast cancer 03/2011    high grade ductal carcinoma in situ left breast    ALLERGIES:  has No Known Allergies.  MEDICATIONS:  Current Outpatient Prescriptions   Medication Sig Dispense Refill  . amLODipine-benazepril (LOTREL) 10-20 MG per capsule Take 1 capsule by mouth daily.      Marland Kitchen aspirin EC 81 MG tablet Take 81 mg by mouth daily.      Marland Kitchen b complex vitamins tablet Take 1 tablet by mouth daily.      . Calcium 600-200 MG-UNIT per tablet Take 1 tablet by mouth daily.      . Cholecalciferol (VITAMIN D3) 2000 UNITS TABS Take 1 tablet by mouth daily.      . cycloSPORINE (RESTASIS) 0.05 % ophthalmic emulsion Place 1 drop into both eyes 2 (two) times daily.      Marland Kitchen exemestane (AROMASIN) 25 MG tablet Take 1 tablet (25 mg total) by mouth daily after breakfast.  90 tablet  12  . hyaluronate sodium (RADIAPLEXRX) GEL Apply topically 2 (two) times daily.      . Magnesium 400 MG CAPS Take 1 tablet by mouth daily.      . Multiple Vitamins-Minerals (MULTIVITAMINS THER. W/MINERALS) TABS Take 1 tablet by mouth daily.      . potassium chloride SA (K-DUR,KLOR-CON) 20 MEQ tablet Take 20 mEq by mouth daily.      Marland Kitchen triamterene-hydrochlorothiazide (DYAZIDE) 37.5-25 MG per capsule Take 1 capsule by mouth every morning.      . vitamin C (ASCORBIC ACID) 500 MG tablet Take 500 mg by mouth daily.      . Cobalamine Combinations (FOLIC + B12 PO) Take  by mouth.      Marland Kitchen GLUCOSAMINE-CHONDROITIN PO Take 5 capsules by mouth daily.      Marland Kitchen HYDROcodone-acetaminophen (NORCO) 10-325 MG per tablet Take 1 tablet by mouth every 4 (four) hours as needed for pain.  30 tablet  0  . nitroGLYCERIN (NITROSTAT) 0.4 MG SL tablet Place 0.4 mg under the tongue every 5 (five) minutes as needed. For chest pain.      . Omega-3 Fatty Acids (FISH OIL) 1000 MG CAPS Take 1 capsule by mouth daily.      Marland Kitchen OVER THE COUNTER MEDICATION Take 2 tablets by mouth daily. Circulation and vein support for healthy legs.      Marland Kitchen oxyCODONE (OXY IR/ROXICODONE) 5 MG immediate release tablet        No current facility-administered medications for this visit.    SURGICAL HISTORY:  Past Surgical History  Procedure Laterality  Date  . Rotator cuff surgery  1998    right  . Tonsillectomy      as a child  . Myomectomy      early 38's  . Cholecystectomy  late 90's  . Cardiac catheterization  12/16/10  . Colonoscopy    . Breast lumpectomy      left breast lumpectomy  . Abdominal hysterectomy  1980    partial hysterectomy  . Joint replacement Right 11/2011    knee replacement    REVIEW OF SYSTEMS:  Pertinent items are noted in HPI.   PHYSICAL EXAMINATION: General appearance: alert, cooperative and appears stated age Lymph nodes: Cervical, supraclavicular, and axillary nodes normal. Resp: clear to auscultation bilaterally and normal percussion bilaterally Back: symmetric, no curvature. ROM normal. No CVA tenderness. Cardio: regular rate and rhythm, S1, S2 normal, no murmur, click, rub or gallop GI: soft, non-tender; bowel sounds normal; no masses,  no organomegaly Extremities: extremities normal, atraumatic, no cyanosis or edema Neurologic: Grossly normal  ECOG PERFORMANCE STATUS: 1 - Symptomatic but completely ambulatory  Blood pressure 181/84, pulse 80, temperature 98.2 F (36.8 C), temperature source Oral, resp. rate 20, height 5\' 4"  (1.626 m), weight 199 lb 11.2 oz (90.583 kg).  LABORATORY DATA: Lab Results  Component Value Date   WBC 4.0 04/06/2012   HGB 13.2 04/06/2012   HCT 39.1 04/06/2012   MCV 92.9 04/06/2012   PLT 298 04/06/2012      Chemistry      Component Value Date/Time   NA 144 04/06/2012 1128   NA 141 05/13/2011 1357   K 3.3* 04/06/2012 1128   K 3.8 05/13/2011 1357   CL 106 04/06/2012 1128   CL 102 05/13/2011 1357   CO2 27 04/06/2012 1128   CO2 31 05/13/2011 1357   BUN 14.4 04/06/2012 1128   BUN 18 05/13/2011 1357   CREATININE 0.8 04/06/2012 1128   CREATININE 0.55 05/13/2011 1357      Component Value Date/Time   CALCIUM 10.5* 04/06/2012 1128   CALCIUM 10.1 05/13/2011 1357   ALKPHOS 92 04/06/2012 1128   ALKPHOS 91 05/13/2011 1357   AST 16 04/06/2012 1128   AST 31 05/13/2011 1357   ALT 10  04/06/2012 1128   ALT 22 05/13/2011 1357   BILITOT 0.75 04/06/2012 1128   BILITOT 0.6 05/13/2011 1357     Diagnosis Breast, lumpectomy, Left - RESIDUAL INTERMEDIATE TO HIGH GRADE DCIS, APPROXIMATELY 0.5 CM IN DIMENSION, STATUS POST BIOPSY. - SEE COMMENT. Microscopic Comment BREAST, IN SITU CARCINOMA Specimen, including laterality: Left breast. Procedure: Excision with wire guided localization. Grade of carcinoma: Intermediate to  high grade. Necrosis: Present. Estimated tumor size: (gross measurement): 0.5 cm Treatment effect: N/A Distance to closest margin: Intermediate grade DCIS comes to within 0.4 cm of inferior margin (closest margin). Remaining margins 1 cm or greater. Breast prognostic profile: Estrogen receptor: 8% ([id number]) Progesterone receptor: 7% ([id number]) Lymph nodes: None received. TNM: pTis, pNX Comments: Sections show a stellate fibrotic scar area containing foreign material consistent with a previous biopsy site. At the periphery of the scar, there are multiple foci of intermediate to high grade DCIS with focal necrosis. The DCIS is present in four of five paraffin blocks representing the entirety of the scar/biopsy site and spans a distance of approximately 0.5 cm (estimated gross measurement). Additional clinical correlation with radiographic studies is recommended for the estimation of the lesional size. ER/PR studies were performed on the recent biopsy specimen ([id number]). (RM:gt, 05/10/11) Lloyd Huger M.D. Ph.D. Pathologist, Electronic Signature (Case signed 05/10/2011) Specimen Gross and Clinical Information 1 of  ASSESSMENT: 72 year old female with:  1.  high-grade DCIS measuring about 0.5 cm. She is now status post lumpectomy. Postoperatively she is doing well. Will now complete radiation therapy to the left breast on 07/11/2011. Overall she has tolerated the radiation well. After this she will begin Aromasin 25 mg daily beginning 07/14/2011. Risks  and benefits of Aromasin were discussed with her as well as literature was given to her. Patient is doing well no evidence of recurrent disease.  PLAN:    #1 she will continue to take Aromasin. A total of 5 years of therapy is planned.  #2 she will be seen back in 12 months time or sooner if need arises.  All questions were answered. The patient knows to call the clinic with any problems, questions or concerns. We can certainly see the patient much sooner if necessary.  I spent 25 minutes counseling the patient face to face. The total time spent in the appointment was 30 minutes.    Drue Second, MD Medical/Oncology [facility] [phone] (beeper) [phone] (Office)  04/30/2012, 11:09 AM

## 2012-05-23 ENCOUNTER — Telehealth: Payer: Self-pay | Admitting: *Deleted

## 2012-05-23 NOTE — Telephone Encounter (Signed)
Pt states she is requesting this appt due to needing toe nails clipped, pt denies ingrown toe nails and states she is not DM.  Pt states she has Medicare and GHI ins and that her medicare is only for hospital coverage.  Need Dr. Ashley Royalty to place order for referral into Epic.

## 2012-05-25 ENCOUNTER — Other Ambulatory Visit: Payer: Self-pay | Admitting: *Deleted

## 2012-05-25 NOTE — Telephone Encounter (Signed)
Marcelino Duster you need to create an order INCLUDE DIAGNOSIS so that Carollee Herter can refer to foot doc.

## 2012-06-05 ENCOUNTER — Encounter: Payer: Self-pay | Admitting: Radiation Oncology

## 2012-06-07 ENCOUNTER — Encounter: Payer: Self-pay | Admitting: Radiation Oncology

## 2012-06-07 ENCOUNTER — Ambulatory Visit
Admission: RE | Admit: 2012-06-07 | Discharge: 2012-06-07 | Disposition: A | Discharge status: Home / Self Care | Payer: Medicare Other | Source: Ambulatory Visit | Attending: Radiation Oncology | Admitting: Radiation Oncology

## 2012-06-07 VITALS — BP 148/89 | HR 71 | Temp 98.5°F | Resp 20

## 2012-06-07 DIAGNOSIS — D0512 Intraductal carcinoma in situ of left breast: Secondary | ICD-10-CM

## 2012-06-07 HISTORY — DX: Personal history of irradiation: Z92.3

## 2012-06-07 NOTE — Progress Notes (Addendum)
Follow yp s/p rad tx left breast,06/07/11-07/11/11,  alert,oriented x3, ambulating with cane, left knee, surgery scheduled 08/13/12,  aromasin daily still, still feels fair, pain in left knee Last  B/l  mammogram 04/20/12

## 2012-06-07 NOTE — Progress Notes (Signed)
Department of Radiation Oncology  Phone:  [phone] Fax:        [phone]   Name: Kara Diaz MRN: [id number]  DOB: March 13, 1940  Date: 06/07/2012  Follow Up Visit Note  Diagnosis: DCIS of the right breast   Summary and Interval since last radiation: 1 year from 50 Gy completed 07/11/11  Interval History: Kara Diaz presents today for routine followup.  She is feeling better. She had her right knee replaced and is looking forward to a left knee replacement. She is more active. She is participating in a gym. She is tolerating her antiestrogen therapy well. She had negative mammograms in April of this year. She has no breast related complaints.  Allergies: No Known Allergies  Medications:  Current Outpatient Prescriptions  Medication Sig Dispense Refill  . amLODipine-benazepril (LOTREL) 10-20 MG per capsule Take 1 capsule by mouth daily.      Marland Kitchen aspirin EC 81 MG tablet Take 81 mg by mouth daily.      Marland Kitchen b complex vitamins tablet Take 1 tablet by mouth daily.      . Calcium 600-200 MG-UNIT per tablet Take 1 tablet by mouth daily.      . Cholecalciferol (VITAMIN D3) 2000 UNITS TABS Take 1 tablet by mouth daily.      . Cobalamine Combinations (FOLIC + B12 PO) Take by mouth.      . cycloSPORINE (RESTASIS) 0.05 % ophthalmic emulsion Place 1 drop into both eyes 2 (two) times daily.      Marland Kitchen exemestane (AROMASIN) 25 MG tablet Take 1 tablet (25 mg total) by mouth daily after breakfast.  90 tablet  12  . GLUCOSAMINE-CHONDROITIN PO Take 5 capsules by mouth daily.      . hyaluronate sodium (RADIAPLEXRX) GEL Apply topically 2 (two) times daily.      . Magnesium 400 MG CAPS Take 1 tablet by mouth daily.      . Multiple Vitamins-Minerals (MULTIVITAMINS THER. W/MINERALS) TABS Take 1 tablet by mouth daily.      . nitroGLYCERIN (NITROSTAT) 0.4 MG SL tablet Place 0.4 mg under the tongue every 5 (five) minutes as needed. For chest pain.      . Omega-3 Fatty Acids (FISH OIL) 1000 MG CAPS Take 1  capsule by mouth daily.      Marland Kitchen OVER THE COUNTER MEDICATION Take 2 tablets by mouth daily. Circulation and vein support for healthy legs.      Marland Kitchen oxyCODONE (OXY IR/ROXICODONE) 5 MG immediate release tablet       . potassium chloride SA (K-DUR,KLOR-CON) 20 MEQ tablet Take 20 mEq by mouth daily.      Marland Kitchen triamterene-hydrochlorothiazide (DYAZIDE) 37.5-25 MG per capsule Take 1 capsule by mouth every morning.      . vitamin C (ASCORBIC ACID) 500 MG tablet Take 500 mg by mouth daily.       No current facility-administered medications for this encounter.    Physical Exam:  Filed Vitals:   06/07/12 1253  BP: 148/89  Pulse: 71  Temp: 98.5 F (36.9 C)  Resp: 20   she is a pleasant female large pendulous breasts bilaterally. There is no palpable abnormalities in the right breast. In the left upper outer quadrant there is a slight indentation associated with her scar. There is no really palpable seroma cavity. No palpable abnormalities of the left breast. No palpable supraclavicular cervical or axillary adenopathy.  IMPRESSION: Kara Diaz is a 72 y.o. female status post breast conservation for DCIS with no evidence of disease  PLAN:  She looks great. She is tolerating her antiestrogen well. I've discussed with her the importance of sun protection in the treated area. I will followup with her on a when necessary basis. She can always contact me with any questions.    Lurline Hare, MD

## 2012-07-09 DIAGNOSIS — H25019 Cortical age-related cataract, unspecified eye: Secondary | ICD-10-CM | POA: Diagnosis not present

## 2012-07-09 DIAGNOSIS — H04129 Dry eye syndrome of unspecified lacrimal gland: Secondary | ICD-10-CM | POA: Diagnosis not present

## 2012-07-09 DIAGNOSIS — H2510 Age-related nuclear cataract, unspecified eye: Secondary | ICD-10-CM | POA: Diagnosis not present

## 2012-07-12 DIAGNOSIS — L608 Other nail disorders: Secondary | ICD-10-CM | POA: Diagnosis not present

## 2012-07-12 DIAGNOSIS — L84 Corns and callosities: Secondary | ICD-10-CM | POA: Diagnosis not present

## 2012-07-25 ENCOUNTER — Telehealth: Payer: Self-pay | Admitting: Internal Medicine

## 2012-07-25 DIAGNOSIS — C50919 Malignant neoplasm of unspecified site of unspecified female breast: Secondary | ICD-10-CM | POA: Diagnosis not present

## 2012-07-25 DIAGNOSIS — I10 Essential (primary) hypertension: Secondary | ICD-10-CM | POA: Diagnosis not present

## 2012-07-25 DIAGNOSIS — D72819 Decreased white blood cell count, unspecified: Secondary | ICD-10-CM | POA: Diagnosis not present

## 2012-07-25 DIAGNOSIS — Z853 Personal history of malignant neoplasm of breast: Secondary | ICD-10-CM | POA: Diagnosis not present

## 2012-07-25 DIAGNOSIS — Z01818 Encounter for other preprocedural examination: Secondary | ICD-10-CM | POA: Diagnosis not present

## 2012-07-25 DIAGNOSIS — Z87891 Personal history of nicotine dependence: Secondary | ICD-10-CM | POA: Diagnosis not present

## 2012-07-25 DIAGNOSIS — E876 Hypokalemia: Secondary | ICD-10-CM | POA: Diagnosis not present

## 2012-07-25 DIAGNOSIS — I2510 Atherosclerotic heart disease of native coronary artery without angina pectoris: Secondary | ICD-10-CM | POA: Diagnosis not present

## 2012-07-25 DIAGNOSIS — M1710 Unilateral primary osteoarthritis, unspecified knee: Secondary | ICD-10-CM | POA: Diagnosis not present

## 2012-07-25 DIAGNOSIS — Z96659 Presence of unspecified artificial knee joint: Secondary | ICD-10-CM | POA: Diagnosis not present

## 2012-07-25 DIAGNOSIS — Z6834 Body mass index (BMI) 34.0-34.9, adult: Secondary | ICD-10-CM | POA: Diagnosis not present

## 2012-08-02 DIAGNOSIS — D72819 Decreased white blood cell count, unspecified: Secondary | ICD-10-CM | POA: Diagnosis not present

## 2012-08-02 DIAGNOSIS — E876 Hypokalemia: Secondary | ICD-10-CM | POA: Diagnosis not present

## 2012-08-02 DIAGNOSIS — Z8639 Personal history of other endocrine, nutritional and metabolic disease: Secondary | ICD-10-CM | POA: Diagnosis not present

## 2012-08-03 ENCOUNTER — Telehealth: Payer: Self-pay | Admitting: Internal Medicine

## 2012-08-03 MED ORDER — TRIAMTERENE-HCTZ 37.5-25 MG PO CAPS: 1.0000 | ORAL_CAPSULE | ORAL | Status: DC

## 2012-08-03 MED ORDER — AMLODIPINE BESY-BENAZEPRIL HCL 10-20 MG PO CAPS: 1.0000 | ORAL_CAPSULE | Freq: Every day | ORAL | Status: DC

## 2012-08-03 NOTE — Telephone Encounter (Signed)
Called [facility] refilled needed for lotrel 10/20 and Dyazide 37.5/25 both daily company states #30 is billed the same as # 90 ( 90 given with 1 refills)

## 2012-08-07 DIAGNOSIS — D72819 Decreased white blood cell count, unspecified: Secondary | ICD-10-CM | POA: Diagnosis not present

## 2012-08-07 DIAGNOSIS — C50919 Malignant neoplasm of unspecified site of unspecified female breast: Secondary | ICD-10-CM | POA: Diagnosis not present

## 2012-08-13 DIAGNOSIS — Z87891 Personal history of nicotine dependence: Secondary | ICD-10-CM | POA: Diagnosis not present

## 2012-08-13 DIAGNOSIS — M1710 Unilateral primary osteoarthritis, unspecified knee: Secondary | ICD-10-CM | POA: Diagnosis not present

## 2012-08-13 DIAGNOSIS — R293 Abnormal posture: Secondary | ICD-10-CM | POA: Diagnosis not present

## 2012-08-13 DIAGNOSIS — M25569 Pain in unspecified knee: Secondary | ICD-10-CM | POA: Diagnosis not present

## 2012-08-13 DIAGNOSIS — G8918 Other acute postprocedural pain: Secondary | ICD-10-CM | POA: Diagnosis not present

## 2012-08-13 DIAGNOSIS — M159 Polyosteoarthritis, unspecified: Secondary | ICD-10-CM | POA: Diagnosis not present

## 2012-08-13 DIAGNOSIS — R269 Unspecified abnormalities of gait and mobility: Secondary | ICD-10-CM | POA: Diagnosis not present

## 2012-08-13 DIAGNOSIS — Z96659 Presence of unspecified artificial knee joint: Secondary | ICD-10-CM | POA: Diagnosis not present

## 2012-08-13 DIAGNOSIS — E876 Hypokalemia: Secondary | ICD-10-CM | POA: Diagnosis not present

## 2012-08-13 DIAGNOSIS — M6281 Muscle weakness (generalized): Secondary | ICD-10-CM | POA: Diagnosis not present

## 2012-08-13 DIAGNOSIS — D649 Anemia, unspecified: Secondary | ICD-10-CM | POA: Diagnosis not present

## 2012-08-13 DIAGNOSIS — D62 Acute posthemorrhagic anemia: Secondary | ICD-10-CM | POA: Diagnosis not present

## 2012-08-13 DIAGNOSIS — I10 Essential (primary) hypertension: Secondary | ICD-10-CM | POA: Diagnosis not present

## 2012-08-13 DIAGNOSIS — Z853 Personal history of malignant neoplasm of breast: Secondary | ICD-10-CM | POA: Diagnosis not present

## 2012-08-13 DIAGNOSIS — Z471 Aftercare following joint replacement surgery: Secondary | ICD-10-CM | POA: Diagnosis not present

## 2012-08-16 DIAGNOSIS — R293 Abnormal posture: Secondary | ICD-10-CM | POA: Diagnosis not present

## 2012-08-16 DIAGNOSIS — D649 Anemia, unspecified: Secondary | ICD-10-CM | POA: Diagnosis not present

## 2012-08-16 DIAGNOSIS — Z853 Personal history of malignant neoplasm of breast: Secondary | ICD-10-CM | POA: Diagnosis not present

## 2012-08-16 DIAGNOSIS — M1710 Unilateral primary osteoarthritis, unspecified knee: Secondary | ICD-10-CM | POA: Diagnosis not present

## 2012-08-16 DIAGNOSIS — Z471 Aftercare following joint replacement surgery: Secondary | ICD-10-CM | POA: Diagnosis not present

## 2012-08-16 DIAGNOSIS — Z7901 Long term (current) use of anticoagulants: Secondary | ICD-10-CM | POA: Diagnosis not present

## 2012-08-16 DIAGNOSIS — Z96659 Presence of unspecified artificial knee joint: Secondary | ICD-10-CM | POA: Diagnosis not present

## 2012-08-16 DIAGNOSIS — M6281 Muscle weakness (generalized): Secondary | ICD-10-CM | POA: Diagnosis not present

## 2012-08-16 DIAGNOSIS — D62 Acute posthemorrhagic anemia: Secondary | ICD-10-CM | POA: Diagnosis not present

## 2012-08-16 DIAGNOSIS — K5900 Constipation, unspecified: Secondary | ICD-10-CM | POA: Diagnosis not present

## 2012-08-16 DIAGNOSIS — I10 Essential (primary) hypertension: Secondary | ICD-10-CM | POA: Diagnosis not present

## 2012-08-16 DIAGNOSIS — M159 Polyosteoarthritis, unspecified: Secondary | ICD-10-CM | POA: Diagnosis not present

## 2012-08-16 DIAGNOSIS — R269 Unspecified abnormalities of gait and mobility: Secondary | ICD-10-CM | POA: Diagnosis not present

## 2012-08-16 DIAGNOSIS — E876 Hypokalemia: Secondary | ICD-10-CM | POA: Diagnosis not present

## 2012-08-17 ENCOUNTER — Non-Acute Institutional Stay (SKILLED_NURSING_FACILITY): Payer: Medicare Other | Admitting: Adult Health

## 2012-08-17 ENCOUNTER — Other Ambulatory Visit: Payer: Self-pay | Admitting: Geriatric Medicine

## 2012-08-17 DIAGNOSIS — M1710 Unilateral primary osteoarthritis, unspecified knee: Secondary | ICD-10-CM | POA: Diagnosis not present

## 2012-08-17 DIAGNOSIS — Z7901 Long term (current) use of anticoagulants: Secondary | ICD-10-CM | POA: Diagnosis not present

## 2012-08-17 DIAGNOSIS — D62 Acute posthemorrhagic anemia: Secondary | ICD-10-CM

## 2012-08-17 DIAGNOSIS — I10 Essential (primary) hypertension: Secondary | ICD-10-CM | POA: Diagnosis not present

## 2012-08-17 DIAGNOSIS — M1712 Unilateral primary osteoarthritis, left knee: Secondary | ICD-10-CM

## 2012-08-17 DIAGNOSIS — Z853 Personal history of malignant neoplasm of breast: Secondary | ICD-10-CM

## 2012-08-17 DIAGNOSIS — K5900 Constipation, unspecified: Secondary | ICD-10-CM | POA: Diagnosis not present

## 2012-08-17 MED ORDER — OXYCODONE HCL 5 MG PO TABS: ORAL_TABLET | ORAL | Status: DC

## 2012-08-20 ENCOUNTER — Non-Acute Institutional Stay (SKILLED_NURSING_FACILITY): Payer: Medicare Other | Admitting: Internal Medicine

## 2012-08-20 DIAGNOSIS — IMO0002 Reserved for concepts with insufficient information to code with codable children: Secondary | ICD-10-CM

## 2012-08-20 DIAGNOSIS — I10 Essential (primary) hypertension: Secondary | ICD-10-CM | POA: Diagnosis not present

## 2012-08-20 DIAGNOSIS — K5900 Constipation, unspecified: Secondary | ICD-10-CM

## 2012-08-20 DIAGNOSIS — M1712 Unilateral primary osteoarthritis, left knee: Secondary | ICD-10-CM

## 2012-08-20 DIAGNOSIS — M1710 Unilateral primary osteoarthritis, unspecified knee: Secondary | ICD-10-CM

## 2012-08-20 DIAGNOSIS — D62 Acute posthemorrhagic anemia: Secondary | ICD-10-CM

## 2012-08-21 ENCOUNTER — Encounter: Payer: Self-pay | Admitting: Adult Health

## 2012-08-21 ENCOUNTER — Non-Acute Institutional Stay (SKILLED_NURSING_FACILITY): Payer: Medicare Other | Admitting: Adult Health

## 2012-08-21 DIAGNOSIS — D62 Acute posthemorrhagic anemia: Secondary | ICD-10-CM | POA: Insufficient documentation

## 2012-08-21 DIAGNOSIS — M1712 Unilateral primary osteoarthritis, left knee: Secondary | ICD-10-CM | POA: Insufficient documentation

## 2012-08-21 DIAGNOSIS — Z853 Personal history of malignant neoplasm of breast: Secondary | ICD-10-CM | POA: Diagnosis not present

## 2012-08-21 DIAGNOSIS — I10 Essential (primary) hypertension: Secondary | ICD-10-CM | POA: Insufficient documentation

## 2012-08-21 DIAGNOSIS — K5900 Constipation, unspecified: Secondary | ICD-10-CM | POA: Diagnosis not present

## 2012-08-21 DIAGNOSIS — Z7901 Long term (current) use of anticoagulants: Secondary | ICD-10-CM

## 2012-08-21 DIAGNOSIS — M1710 Unilateral primary osteoarthritis, unspecified knee: Secondary | ICD-10-CM

## 2012-08-21 NOTE — Progress Notes (Signed)
Patient ID: Kara Diaz, female   DOB: 1940/09/21, 72 y.o.   MRN: [id number]       PROGRESS NOTE  DATE: 08/17/2012  FACILITY:  [facility]  LEVEL OF CARE: SNF (31)  Acute Visit  CHIEF COMPLAINT:  Follow-up hospitalization  HISTORY OF PRESENT ILLNESS: The is a 72 year old female who was being admitted to [facility] on 08/16/12 from [facility] with osteoarthritis please status post left total knee arthroplasty. She had been admitted for a short-term rehabilitation.     PAST MEDICAL HISTORY : Reviewed.  No changes.  CURRENT MEDICATIONS: Reviewed per [facility]  REVIEW OF SYSTEMS:  GENERAL: no change in appetite, no fatigue, no weight changes, no fever, chills or weakness RESPIRATORY: no cough, SOB, DOE,, wheezing, hemoptysis CARDIAC: no chest pain, edema or palpitations GI: no abdominal pain, diarrhea, constipation, heart burn, nausea or vomiting  PHYSICAL EXAMINATION  VS:  T 98        P 88       RR 19       BP 1:30/68            WT 216.4 (Lb)  GENERAL: no acute distress, normal body habitus EYES: conjunctivae normal, sclerae normal, normal eye lids NECK: supple, trachea midline, no neck masses, no thyroid tenderness, no thyromegaly LYMPHATICS: no LAN in the neck, no supraclavicular LAN RESPIRATORY: breathing is even & unlabored, BS CTAB CARDIAC: RRR, no murmur,no extra heart sounds, no edema GI: abdomen soft, normal BS, no masses, no tenderness, no hepatomegaly, no splenomegaly PSYCHIATRIC: the patient is alert & oriented to person, affect & behavior appropriate  LABS/RADIOLOGY: 08/16/12 WBC 4.8 hemoglobin 9.0 hematocrit 27.2 08/14/12 sodium 137 potassium 3.9 BUN 10 glucose 120 creatinine 0.50 calcium 8.6   ASSESSMENT/PLAN:  Osteoarthritis status post left total knee arthroplasty - for PT and OT  Hypertension - well controlled  Acute blood loss anemia - stable  History of breast cancer - continue  Exemestane  Constipation - no complaints    CPT CODE: 40981

## 2012-08-21 NOTE — Progress Notes (Signed)
Patient ID: Kara Diaz, female   DOB: Oct 19, 1940, 72 y.o.   MRN: [id number] Subjective:     Indication: DVT  prophylaxis Bleeding signs/symptoms: None Thromboembolic signs/symptoms: None  Missed Coumadin doses: None Medication changes: no Dietary changes: no Bacterial/viral infection: no Other concerns: no  The following portions of the patient's history were reviewed and updated as appropriate: allergies, current medications, past family history, past medical history, past social history, past surgical history and problem list.  Review of Systems A comprehensive review of systems was negative.   Objective:    INR Today: 1.6 Current dose: Coumadin 6 mg by mouth daily  Assessment:    Subtherapeutic INR for goal of 2-3   Plan:    1. New dose: Increase Coumadin to 7 mg by mouth daily   2. Next INR:   08/24/12

## 2012-08-29 DIAGNOSIS — Z96659 Presence of unspecified artificial knee joint: Secondary | ICD-10-CM | POA: Diagnosis not present

## 2012-08-31 ENCOUNTER — Non-Acute Institutional Stay (SKILLED_NURSING_FACILITY): Payer: Medicare Other | Admitting: Adult Health

## 2012-08-31 ENCOUNTER — Encounter: Payer: Self-pay | Admitting: Adult Health

## 2012-08-31 DIAGNOSIS — Z853 Personal history of malignant neoplasm of breast: Secondary | ICD-10-CM

## 2012-08-31 DIAGNOSIS — K5900 Constipation, unspecified: Secondary | ICD-10-CM

## 2012-08-31 DIAGNOSIS — I10 Essential (primary) hypertension: Secondary | ICD-10-CM

## 2012-08-31 DIAGNOSIS — D62 Acute posthemorrhagic anemia: Secondary | ICD-10-CM | POA: Diagnosis not present

## 2012-08-31 DIAGNOSIS — M1710 Unilateral primary osteoarthritis, unspecified knee: Secondary | ICD-10-CM | POA: Diagnosis not present

## 2012-08-31 DIAGNOSIS — M1712 Unilateral primary osteoarthritis, left knee: Secondary | ICD-10-CM

## 2012-08-31 NOTE — Progress Notes (Deleted)
Patient ID: Kara Diaz, female   DOB: 1940-02-09, 72 y.o.   MRN: [id number]       PROGRESS NOTE  DATE: 08/31/2012   FACILITY: [facility]  LEVEL OF CARE: SNF (31)  CHIEF COMPLAINT:  Discharge Visit   HISTORY OF PRESENT ILLNESS: This is a 72 year old female who is for discharge home with home health PT and nursing. She has been admitted [facility]12 from [facility] with osteoarthritis please status post left total knee arthroplasty. Patient was admitted to this facility for short-term rehabilitation after the patient's recent hospitalization.  Patient has completed SNF rehabilitation and therapy has cleared the patient for discharge.  Reassessment      PROGRESS NOTE  DATE: 08/31/2012   FACILITY: [facility]  LEVEL OF CARE: SNF (31)  Discharge Visit  CHIEF COMPLAINT:  Manage  HISTORY OF PRESENT ILLNESS: I was requested by the social worker to perform face-to-face evaluation for discharge:  Patient was admitted to this facility for short-term rehabilitation after the patient's recent hospitalization.  Patient has completed SNF rehabilitation and therapy has cleared the patient for discharge.  Reassessment of ongoing problem(s):  PAST MEDICAL HISTORY : Reviewed.  No changes.  CURRENT MEDICATIONS: Reviewed per [facility]  REVIEW OF SYSTEMS:  GENERAL: no change in appetite, no fatigue, no weight changes, no fever, chills or weakness RESPIRATORY: no cough, SOB, DOE, wheezing, hemoptysis CARDIAC: no chest pain, edema or palpitations GI: no abdominal pain, diarrhea, constipation, heart burn, nausea or vomiting  PHYSICAL EXAMINATION  VS:  T       P       RR      BP      POX %       WT (Lb)  GENERAL: no acute distress, normal body habitus EYES: conjunctivae normal, sclerae normal, normal eye lids NECK: supple, trachea midline, no neck masses, no thyroid tenderness, no thyromegaly LYMPHATICS: no  LAN in the neck, no supraclavicular LAN RESPIRATORY: breathing is even & unlabored, BS CTAB CARDIAC: RRR, no murmur,no extra heart sounds, no edema GI: abdomen soft, normal BS, no masses, no tenderness, no hepatomegaly, no splenomegaly PSYCHIATRIC: the patient is alert & oriented to person, affect & behavior appropriate  LABS/RADIOLOGY:  ASSESSMENT/PLAN:  I have filled out patient's discharge paperwork and written prescriptions.  Patient will receive home health PT, OT, ST, nursing and CNA. DME provided:  Total discharge time: Greater/less than 30 minutes Discharge time involved coordination of the discharge process with Child psychotherapist, nursing staff and therapy department. Medical justification for home health services/DME verified.  CPT CODE: 9931    HTN: Pt 's HTN remains stable.  Denies CP, sob, DOE, pedal edema, headaches, dizziness or visual disturbances.  No complications from the medications currently being used.  Last BP : And all and a nt of ongoing problem(s):  PAST MEDICAL HISTORY : Reviewed.  No changes.  CURRENT MEDICATIONS: Reviewed per [facility]  REVIEW OF SYSTEMS:  GENERAL: no change in appetite, no fatigue, no weight changes, no fever, chills or weakness RESPIRATORY: no cough, SOB, DOE, wheezing, hemoptysis CARDIAC: no chest pain, edema or palpitations GI: no abdominal pain, diarrhea, constipation, heart burn, nausea or vomiting  PHYSICAL EXAMINATION  VS:  T       P       RR      BP      POX %       WT (Lb)  GENERAL: no acute distress, normal body habitus EYES: conjunctivae normal, sclerae normal, normal eye lids NECK: supple, trachea midline, no neck masses, no thyroid tenderness, no thyromegaly LYMPHATICS: no LAN in the neck, no supraclavicular LAN RESPIRATORY: breathing is even & unlabored, BS CTAB CARDIAC: RRR, no murmur,no extra heart sounds, no edema GI: abdomen soft, normal BS, no masses, no tenderness, no hepatomegaly, no splenomegaly PSYCHIATRIC: the  patient is alert & oriented to person, affect & behavior appropriate  LABS/RADIOLOGY:  ASSESSMENT/PLAN:  I have filled out patient's discharge paperwork and written prescriptions.  Patient will receive home health PT, OT, ST, nursing and CNA. DME provided:  Total discharge time: Greater/less than 30 minutes Discharge time involved coordination of the discharge process with Child psychotherapist, nursing staff and therapy department. Medical justification for home health services/DME verified.  CPT CODE: [phone]

## 2012-08-31 NOTE — Progress Notes (Signed)
Patient ID: Kara Diaz, female   DOB: [birth year], 72 y.o.   MRN: [id number]       PROGRESS NOTE  DATE: 08/31/2012   FACILITY: [facility]  LEVEL OF CARE: SNF (31)   CHIEF COMPLAINT:  Discharge Visit  HISTORY OF PRESENT ILLNESS: This is a 72 year old female who was for discharge home with home health PT and nursing. She has been admitted to [facility] on 08/16/12 from [facility] with osteoarthritis please status post left total knee arthroplasty.Patient was admitted to this facility for short-term rehabilitation after the patient's recent hospitalization. Patient has completed SNF rehabilitation and therapy has cleared the patient for discharge.    Reassessment of ongoing problem(s):  HTN: Pt 's HTN remains stable.  Denies CP, sob, DOE, pedal edema, headaches, dizziness or visual disturbances.  No complications from the medications currently being used.  Last BP : 142/67  ANEMIA: The anemia has been stable. The patient denies fatigue, melena or hematochezia. No complications from the medications currently being used.   PAST MEDICAL HISTORY : Reviewed.  No changes.  CURRENT MEDICATIONS: Reviewed per [facility]  REVIEW OF SYSTEMS:  GENERAL: no change in appetite, no fatigue, no weight changes, no fever, chills or weakness RESPIRATORY: no cough, SOB, DOE, wheezing, hemoptysis CARDIAC: no chest pain, edema or palpitations GI: no abdominal pain, diarrhea, constipation, heart burn, nausea or vomiting  PHYSICAL EXAMINATION  VS:  T 98.1       P 80 BP       RR 19      BP 142/67      POX 99 %       WT 212.4 (Lb)  GENERAL: no acute distress, normal body habitus EYES: conjunctivae normal, sclerae normal, normal eye lids NECK: supple, trachea midline, no neck masses, no thyroid tenderness, no thyromegaly LYMPHATICS: no LAN in the neck, no supraclavicular LAN RESPIRATORY: breathing is even & unlabored, BS CTAB CARDIAC: RRR, no murmur,no  extra heart sounds, no edema GI: abdomen soft, normal BS, no masses, no tenderness, no hepatomegaly, no splenomegaly PSYCHIATRIC: the patient is alert & oriented to person, affect & behavior appropriate  LABS/RADIOLOGY:  08/21/12 WBC 5.0 hemoglobin 9.9 hematocrit 32.0 sodium 130 potassium 4.0 glucose 125 BUN 13 creatinine 0.6 calcium 9.9 08/16/12 WBC 4.8 hemoglobin 9.0 hematocrit 27.2 08/14/12 sodium 137 potassium 3.9 BUN 10 glucose 120 creatinine 0.50 calcium 8.6  ASSESSMENT/PLAN:  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  Patient will receive home health PT and Nursing.   Total discharge time: Less than 30 minutes Discharge time involved coordination of the discharge process with Child psychotherapist, nursing staff and therapy department. Medical justification for home health services will verified.  CPT CODE: 40981

## 2012-09-07 DIAGNOSIS — Z96659 Presence of unspecified artificial knee joint: Secondary | ICD-10-CM | POA: Diagnosis not present

## 2012-09-07 DIAGNOSIS — Z471 Aftercare following joint replacement surgery: Secondary | ICD-10-CM | POA: Diagnosis not present

## 2012-09-07 DIAGNOSIS — Z7902 Long term (current) use of antithrombotics/antiplatelets: Secondary | ICD-10-CM | POA: Diagnosis not present

## 2012-09-07 DIAGNOSIS — Z7901 Long term (current) use of anticoagulants: Secondary | ICD-10-CM | POA: Diagnosis not present

## 2012-09-07 DIAGNOSIS — D649 Anemia, unspecified: Secondary | ICD-10-CM | POA: Diagnosis not present

## 2012-09-07 DIAGNOSIS — I10 Essential (primary) hypertension: Secondary | ICD-10-CM | POA: Diagnosis not present

## 2012-09-08 DIAGNOSIS — Z96659 Presence of unspecified artificial knee joint: Secondary | ICD-10-CM | POA: Diagnosis not present

## 2012-09-08 DIAGNOSIS — Z471 Aftercare following joint replacement surgery: Secondary | ICD-10-CM | POA: Diagnosis not present

## 2012-09-08 DIAGNOSIS — D649 Anemia, unspecified: Secondary | ICD-10-CM | POA: Diagnosis not present

## 2012-09-08 DIAGNOSIS — I10 Essential (primary) hypertension: Secondary | ICD-10-CM | POA: Diagnosis not present

## 2012-09-08 DIAGNOSIS — Z7902 Long term (current) use of antithrombotics/antiplatelets: Secondary | ICD-10-CM | POA: Diagnosis not present

## 2012-09-11 DIAGNOSIS — Z7902 Long term (current) use of antithrombotics/antiplatelets: Secondary | ICD-10-CM | POA: Diagnosis not present

## 2012-09-11 DIAGNOSIS — Z471 Aftercare following joint replacement surgery: Secondary | ICD-10-CM | POA: Diagnosis not present

## 2012-09-11 DIAGNOSIS — Z96659 Presence of unspecified artificial knee joint: Secondary | ICD-10-CM | POA: Diagnosis not present

## 2012-09-11 DIAGNOSIS — I10 Essential (primary) hypertension: Secondary | ICD-10-CM | POA: Diagnosis not present

## 2012-09-11 DIAGNOSIS — D649 Anemia, unspecified: Secondary | ICD-10-CM | POA: Diagnosis not present

## 2012-09-11 NOTE — Progress Notes (Signed)
Patient ID: Kara Diaz, female   DOB: [birth year], 72 y.o.   MRN: [id number]        HISTORY & PHYSICAL  DATE: 08/20/2012   FACILITY: [facility]  LEVEL OF CARE: SNF (31)  ALLERGIES:  No Known Allergies  CHIEF COMPLAINT:  Manage right knee osteoarthritis, acute blood loss anemia, and hypertension.    HISTORY OF PRESENT ILLNESS:  The patient is a 72 year-old, African-American female.    KNEE OSTEOARTHRITIS: Patient had a history of pain and functional disability in the knee due to end-stage osteoarthritis and has failed nonsurgical conservative treatments. Patient had worsening of pain with activity and weight bearing, pain that interfered with activities of daily living & pain with passive range of motion. Therefore patient underwent total knee arthroplasty and tolerated the procedure well. Patient is admitted to this facility for sort short-term rehabilitation. Patient denies knee pain.   HTN: Pt 's HTN remains stable.  Denies CP, sob, DOE, pedal edema, headaches, dizziness or visual disturbances.  No complications from the medications currently being used.  Last BP :  145/66, 145/89.  ANEMIA: Postoperatively, patient suffered acute blood loss.   The anemia has been stable. The patient denies fatigue, melena or hematochezia. No complications from the medications currently being used.  Last hemoglobin:  10.3.     PAST MEDICAL HISTORY :  Past Medical History  Diagnosis Date  . Angina   . PONV (postoperative nausea and vomiting)   . Hypertension     takes Dyazide and Lotrel daily  . Hyperlipidemia     doesn't take any meds for this  . Pneumonia     hx of in 1992  . Joint pain   . Joint swelling   . Arthritis     all over  . Hemorrhoids   . Urinary frequency   . Early cataracts, bilateral   . Insomnia     but doesn't take anything for this  . 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 History of radiation therapy  06/07/11-07/11/11    left breast 50Gy/23fx   Hypokalemia.    Constipation.    Right knee osteoarthritis.    PAST SURGICAL HISTORY: Past Surgical History  Procedure Laterality Date  . Rotator cuff surgery  1998    right  . Tonsillectomy      as a child  . Myomectomy      early 9's  . Cholecystectomy  late 90's  . Cardiac catheterization  12/16/10  . Colonoscopy    . Breast lumpectomy      left breast lumpectomy  . Abdominal hysterectomy  1980    partial hysterectomy  . Joint replacement Right 11/2011    knee replacement    SOCIAL HISTORY:  reports that she quit smoking about 22 years ago. Her smoking use included Cigarettes. She smoked 0.00 packs per day. She has never used smokeless tobacco. She reports that  drinks alcohol. She reports that she does not use illicit drugs. EMPLOYMENT HISTORY:  She is a fashion Contractor.    FAMILY HISTORY:  Family History  Problem Relation Age of Onset  . Malignant hyperthermia Neg Hx   . Hypotension Neg Hx   . Pseudochol deficiency Neg Hx   . Hypertension Mother   . Hypertension Father   . Cancer Brother     prostate  . Stroke Brother   . Cancer Cousin   . Cancer Cousin  Coronary artery disease.    Diabetes mellitus.    CURRENT MEDICATIONS: Reviewed per [facility]  REVIEW OF SYSTEMS:  See HPI otherwise 14 point ROS is negative.  PHYSICAL EXAMINATION  VS:  T 97       P 72      RR 18      BP 145/66      POX 95% room air        WT (Lb)  GENERAL: no acute distress, moderately obese body habitus EYES: conjunctivae normal, sclerae normal, normal eye lids MOUTH/THROAT: lips without lesions,no lesions in the mouth,tongue is without lesions,uvula elevates in midline NECK: supple, trachea midline, no neck masses, no thyroid tenderness, no thyromegaly LYMPHATICS: no LAN in the neck, no supraclavicular LAN RESPIRATORY: breathing is even & unlabored, BS CTAB CARDIAC: RRR, no murmur,no extra heart sounds EDEMA/VARICOSITIES: right lower  extremity has +2 edema ARTERIAL: pedal pulse nonpalpable  GI:  ABDOMEN: abdomen soft, normal BS, no masses, no tenderness  LIVER/SPLEEN: no hepatomegaly, no splenomegaly MUSCULOSKELETAL: HEAD: normal to inspection & palpation BACK: no kyphosis, scoliosis or spinal processes tenderness EXTREMITIES: LEFT UPPER EXTREMITY: full range of motion, normal strength & tone RIGHT UPPER EXTREMITY:  full range of motion, normal strength & tone LEFT LOWER EXTREMITY: strength intact, range of motion moderate  RIGHT LOWER EXTREMITY: strength intact, range of motion not tested due to surgery   PSYCHIATRIC: the patient is alert & oriented to person, affect & behavior appropriate  LABS/RADIOLOGY: Glucose 120, otherwise BMP normal.    Hemoglobin 10.3, MCV 94.8, otherwise CBC normal.    ASSESSMENT/PLAN:  Right knee osteoarthritis.  Status post right total knee arthroplasty.  Continue rehabilitation.    Acute blood loss anemia.  Reassess hemoglobin level.  Continue iron.    Hypertension.  Blood pressure borderline.  We will monitor.    Constipation.  Continue current laxatives.    Hypokalemia.  Continue supplementation.  We will reassess.    Check CBC and BMP.    I have reviewed patient's medical records received at admission/from hospitalization.  CPT CODE: 16109

## 2012-09-12 DIAGNOSIS — Z471 Aftercare following joint replacement surgery: Secondary | ICD-10-CM | POA: Diagnosis not present

## 2012-09-12 DIAGNOSIS — Z7902 Long term (current) use of antithrombotics/antiplatelets: Secondary | ICD-10-CM | POA: Diagnosis not present

## 2012-09-12 DIAGNOSIS — Z96659 Presence of unspecified artificial knee joint: Secondary | ICD-10-CM | POA: Diagnosis not present

## 2012-09-12 DIAGNOSIS — I10 Essential (primary) hypertension: Secondary | ICD-10-CM | POA: Diagnosis not present

## 2012-09-12 DIAGNOSIS — D649 Anemia, unspecified: Secondary | ICD-10-CM | POA: Diagnosis not present

## 2012-09-14 DIAGNOSIS — Z7902 Long term (current) use of antithrombotics/antiplatelets: Secondary | ICD-10-CM | POA: Diagnosis not present

## 2012-09-14 DIAGNOSIS — Z471 Aftercare following joint replacement surgery: Secondary | ICD-10-CM | POA: Diagnosis not present

## 2012-09-14 DIAGNOSIS — I10 Essential (primary) hypertension: Secondary | ICD-10-CM | POA: Diagnosis not present

## 2012-09-14 DIAGNOSIS — D649 Anemia, unspecified: Secondary | ICD-10-CM | POA: Diagnosis not present

## 2012-09-14 DIAGNOSIS — Z96659 Presence of unspecified artificial knee joint: Secondary | ICD-10-CM | POA: Diagnosis not present

## 2012-09-17 DIAGNOSIS — D649 Anemia, unspecified: Secondary | ICD-10-CM | POA: Diagnosis not present

## 2012-09-17 DIAGNOSIS — I10 Essential (primary) hypertension: Secondary | ICD-10-CM | POA: Diagnosis not present

## 2012-09-17 DIAGNOSIS — Z471 Aftercare following joint replacement surgery: Secondary | ICD-10-CM | POA: Diagnosis not present

## 2012-09-17 DIAGNOSIS — Z7902 Long term (current) use of antithrombotics/antiplatelets: Secondary | ICD-10-CM | POA: Diagnosis not present

## 2012-09-17 DIAGNOSIS — Z96659 Presence of unspecified artificial knee joint: Secondary | ICD-10-CM | POA: Diagnosis not present

## 2012-09-18 DIAGNOSIS — D649 Anemia, unspecified: Secondary | ICD-10-CM | POA: Diagnosis not present

## 2012-09-18 DIAGNOSIS — Z7902 Long term (current) use of antithrombotics/antiplatelets: Secondary | ICD-10-CM | POA: Diagnosis not present

## 2012-09-18 DIAGNOSIS — Z96659 Presence of unspecified artificial knee joint: Secondary | ICD-10-CM | POA: Diagnosis not present

## 2012-09-18 DIAGNOSIS — I10 Essential (primary) hypertension: Secondary | ICD-10-CM | POA: Diagnosis not present

## 2012-09-18 DIAGNOSIS — Z471 Aftercare following joint replacement surgery: Secondary | ICD-10-CM | POA: Diagnosis not present

## 2012-09-18 NOTE — Telephone Encounter (Signed)
RX FILLED

## 2012-09-19 DIAGNOSIS — D649 Anemia, unspecified: Secondary | ICD-10-CM | POA: Diagnosis not present

## 2012-09-19 DIAGNOSIS — Z7902 Long term (current) use of antithrombotics/antiplatelets: Secondary | ICD-10-CM | POA: Diagnosis not present

## 2012-09-19 DIAGNOSIS — I10 Essential (primary) hypertension: Secondary | ICD-10-CM | POA: Diagnosis not present

## 2012-09-19 DIAGNOSIS — Z96659 Presence of unspecified artificial knee joint: Secondary | ICD-10-CM | POA: Diagnosis not present

## 2012-09-19 DIAGNOSIS — Z471 Aftercare following joint replacement surgery: Secondary | ICD-10-CM | POA: Diagnosis not present

## 2012-09-21 DIAGNOSIS — Z471 Aftercare following joint replacement surgery: Secondary | ICD-10-CM | POA: Diagnosis not present

## 2012-09-21 DIAGNOSIS — Z7902 Long term (current) use of antithrombotics/antiplatelets: Secondary | ICD-10-CM | POA: Diagnosis not present

## 2012-09-21 DIAGNOSIS — Z96659 Presence of unspecified artificial knee joint: Secondary | ICD-10-CM | POA: Diagnosis not present

## 2012-09-21 DIAGNOSIS — I10 Essential (primary) hypertension: Secondary | ICD-10-CM | POA: Diagnosis not present

## 2012-09-21 DIAGNOSIS — D649 Anemia, unspecified: Secondary | ICD-10-CM | POA: Diagnosis not present

## 2012-09-24 DIAGNOSIS — I10 Essential (primary) hypertension: Secondary | ICD-10-CM | POA: Diagnosis not present

## 2012-09-24 DIAGNOSIS — Z7902 Long term (current) use of antithrombotics/antiplatelets: Secondary | ICD-10-CM | POA: Diagnosis not present

## 2012-09-24 DIAGNOSIS — Z96659 Presence of unspecified artificial knee joint: Secondary | ICD-10-CM | POA: Diagnosis not present

## 2012-09-24 DIAGNOSIS — D649 Anemia, unspecified: Secondary | ICD-10-CM | POA: Diagnosis not present

## 2012-09-24 DIAGNOSIS — Z471 Aftercare following joint replacement surgery: Secondary | ICD-10-CM | POA: Diagnosis not present

## 2012-09-25 DIAGNOSIS — Z96659 Presence of unspecified artificial knee joint: Secondary | ICD-10-CM | POA: Diagnosis not present

## 2012-09-25 DIAGNOSIS — Z7902 Long term (current) use of antithrombotics/antiplatelets: Secondary | ICD-10-CM | POA: Diagnosis not present

## 2012-09-25 DIAGNOSIS — Z7901 Long term (current) use of anticoagulants: Secondary | ICD-10-CM | POA: Diagnosis not present

## 2012-09-25 DIAGNOSIS — Z471 Aftercare following joint replacement surgery: Secondary | ICD-10-CM | POA: Diagnosis not present

## 2012-09-25 DIAGNOSIS — Z5181 Encounter for therapeutic drug level monitoring: Secondary | ICD-10-CM | POA: Diagnosis not present

## 2012-09-25 DIAGNOSIS — I10 Essential (primary) hypertension: Secondary | ICD-10-CM | POA: Diagnosis not present

## 2012-09-25 DIAGNOSIS — D649 Anemia, unspecified: Secondary | ICD-10-CM | POA: Diagnosis not present

## 2012-09-26 DIAGNOSIS — Z96659 Presence of unspecified artificial knee joint: Secondary | ICD-10-CM | POA: Diagnosis not present

## 2012-09-26 DIAGNOSIS — Z7902 Long term (current) use of antithrombotics/antiplatelets: Secondary | ICD-10-CM | POA: Diagnosis not present

## 2012-09-26 DIAGNOSIS — Z471 Aftercare following joint replacement surgery: Secondary | ICD-10-CM | POA: Diagnosis not present

## 2012-09-26 DIAGNOSIS — D649 Anemia, unspecified: Secondary | ICD-10-CM | POA: Diagnosis not present

## 2012-09-26 DIAGNOSIS — I10 Essential (primary) hypertension: Secondary | ICD-10-CM | POA: Diagnosis not present

## 2012-09-28 DIAGNOSIS — Z7902 Long term (current) use of antithrombotics/antiplatelets: Secondary | ICD-10-CM | POA: Diagnosis not present

## 2012-09-28 DIAGNOSIS — Z96659 Presence of unspecified artificial knee joint: Secondary | ICD-10-CM | POA: Diagnosis not present

## 2012-09-28 DIAGNOSIS — Z471 Aftercare following joint replacement surgery: Secondary | ICD-10-CM | POA: Diagnosis not present

## 2012-09-28 DIAGNOSIS — D649 Anemia, unspecified: Secondary | ICD-10-CM | POA: Diagnosis not present

## 2012-09-28 DIAGNOSIS — I10 Essential (primary) hypertension: Secondary | ICD-10-CM | POA: Diagnosis not present

## 2012-10-02 ENCOUNTER — Ambulatory Visit: Payer: Medicare Other | Attending: Orthopaedic Surgery

## 2012-10-02 DIAGNOSIS — M25669 Stiffness of unspecified knee, not elsewhere classified: Secondary | ICD-10-CM | POA: Diagnosis not present

## 2012-10-02 DIAGNOSIS — R609 Edema, unspecified: Secondary | ICD-10-CM | POA: Diagnosis not present

## 2012-10-02 DIAGNOSIS — M6281 Muscle weakness (generalized): Secondary | ICD-10-CM | POA: Diagnosis not present

## 2012-10-02 DIAGNOSIS — R269 Unspecified abnormalities of gait and mobility: Secondary | ICD-10-CM | POA: Diagnosis not present

## 2012-10-02 DIAGNOSIS — M25569 Pain in unspecified knee: Secondary | ICD-10-CM | POA: Insufficient documentation

## 2012-10-02 DIAGNOSIS — Z96659 Presence of unspecified artificial knee joint: Secondary | ICD-10-CM | POA: Diagnosis not present

## 2012-10-02 DIAGNOSIS — IMO0001 Reserved for inherently not codable concepts without codable children: Secondary | ICD-10-CM | POA: Insufficient documentation

## 2012-10-04 ENCOUNTER — Ambulatory Visit: Payer: Medicare Other | Admitting: Physical Therapy

## 2012-10-04 DIAGNOSIS — R269 Unspecified abnormalities of gait and mobility: Secondary | ICD-10-CM | POA: Diagnosis not present

## 2012-10-04 DIAGNOSIS — R609 Edema, unspecified: Secondary | ICD-10-CM | POA: Diagnosis not present

## 2012-10-04 DIAGNOSIS — IMO0001 Reserved for inherently not codable concepts without codable children: Secondary | ICD-10-CM | POA: Diagnosis not present

## 2012-10-04 DIAGNOSIS — M25669 Stiffness of unspecified knee, not elsewhere classified: Secondary | ICD-10-CM | POA: Diagnosis not present

## 2012-10-04 DIAGNOSIS — M6281 Muscle weakness (generalized): Secondary | ICD-10-CM | POA: Diagnosis not present

## 2012-10-04 DIAGNOSIS — M25569 Pain in unspecified knee: Secondary | ICD-10-CM | POA: Diagnosis not present

## 2012-10-05 ENCOUNTER — Ambulatory Visit: Payer: Medicare Other | Admitting: Physical Therapy

## 2012-10-05 DIAGNOSIS — M25569 Pain in unspecified knee: Secondary | ICD-10-CM | POA: Diagnosis not present

## 2012-10-05 DIAGNOSIS — M25669 Stiffness of unspecified knee, not elsewhere classified: Secondary | ICD-10-CM | POA: Diagnosis not present

## 2012-10-05 DIAGNOSIS — M6281 Muscle weakness (generalized): Secondary | ICD-10-CM | POA: Diagnosis not present

## 2012-10-05 DIAGNOSIS — IMO0001 Reserved for inherently not codable concepts without codable children: Secondary | ICD-10-CM | POA: Diagnosis not present

## 2012-10-05 DIAGNOSIS — R609 Edema, unspecified: Secondary | ICD-10-CM | POA: Diagnosis not present

## 2012-10-05 DIAGNOSIS — R269 Unspecified abnormalities of gait and mobility: Secondary | ICD-10-CM | POA: Diagnosis not present

## 2012-10-09 ENCOUNTER — Ambulatory Visit: Payer: Medicare Other | Admitting: Physical Therapy

## 2012-10-09 DIAGNOSIS — M25569 Pain in unspecified knee: Secondary | ICD-10-CM | POA: Diagnosis not present

## 2012-10-09 DIAGNOSIS — M6281 Muscle weakness (generalized): Secondary | ICD-10-CM | POA: Diagnosis not present

## 2012-10-09 DIAGNOSIS — R269 Unspecified abnormalities of gait and mobility: Secondary | ICD-10-CM | POA: Diagnosis not present

## 2012-10-09 DIAGNOSIS — R609 Edema, unspecified: Secondary | ICD-10-CM | POA: Diagnosis not present

## 2012-10-09 DIAGNOSIS — M25669 Stiffness of unspecified knee, not elsewhere classified: Secondary | ICD-10-CM | POA: Diagnosis not present

## 2012-10-09 DIAGNOSIS — IMO0001 Reserved for inherently not codable concepts without codable children: Secondary | ICD-10-CM | POA: Diagnosis not present

## 2012-10-11 ENCOUNTER — Ambulatory Visit: Payer: Medicare Other | Attending: Orthopaedic Surgery

## 2012-10-11 DIAGNOSIS — M25569 Pain in unspecified knee: Secondary | ICD-10-CM | POA: Insufficient documentation

## 2012-10-11 DIAGNOSIS — IMO0001 Reserved for inherently not codable concepts without codable children: Secondary | ICD-10-CM | POA: Diagnosis not present

## 2012-10-11 DIAGNOSIS — M6281 Muscle weakness (generalized): Secondary | ICD-10-CM | POA: Diagnosis not present

## 2012-10-11 DIAGNOSIS — Z96659 Presence of unspecified artificial knee joint: Secondary | ICD-10-CM | POA: Diagnosis not present

## 2012-10-11 DIAGNOSIS — M25669 Stiffness of unspecified knee, not elsewhere classified: Secondary | ICD-10-CM | POA: Insufficient documentation

## 2012-10-11 DIAGNOSIS — R609 Edema, unspecified: Secondary | ICD-10-CM | POA: Insufficient documentation

## 2012-10-11 DIAGNOSIS — R269 Unspecified abnormalities of gait and mobility: Secondary | ICD-10-CM | POA: Insufficient documentation

## 2012-10-12 ENCOUNTER — Ambulatory Visit: Payer: Medicare Other | Admitting: Physical Therapy

## 2012-10-16 ENCOUNTER — Ambulatory Visit: Payer: Medicare Other | Admitting: Physical Therapy

## 2012-10-18 ENCOUNTER — Ambulatory Visit: Payer: Medicare Other | Admitting: Physical Therapy

## 2012-10-19 ENCOUNTER — Ambulatory Visit: Payer: Medicare Other | Admitting: Physical Therapy

## 2012-10-23 ENCOUNTER — Ambulatory Visit: Payer: Medicare Other | Admitting: Physical Therapy

## 2012-10-25 ENCOUNTER — Ambulatory Visit: Payer: Medicare Other | Admitting: Physical Therapy

## 2012-10-26 ENCOUNTER — Ambulatory Visit: Payer: Medicare Other | Admitting: Physical Therapy

## 2012-10-30 ENCOUNTER — Ambulatory Visit: Payer: Medicare Other

## 2012-11-01 ENCOUNTER — Ambulatory Visit: Payer: Medicare Other | Admitting: Physical Therapy

## 2012-11-02 ENCOUNTER — Ambulatory Visit: Payer: Medicare Other | Admitting: Physical Therapy

## 2012-11-06 ENCOUNTER — Ambulatory Visit: Payer: Medicare Other

## 2012-11-08 ENCOUNTER — Ambulatory Visit: Payer: Medicare Other | Admitting: Physical Therapy

## 2012-11-09 ENCOUNTER — Ambulatory Visit: Payer: Medicare Other | Admitting: Physical Therapy

## 2012-11-12 ENCOUNTER — Ambulatory Visit: Payer: Medicare Other | Attending: Orthopaedic Surgery | Admitting: Physical Therapy

## 2012-11-12 DIAGNOSIS — M6281 Muscle weakness (generalized): Secondary | ICD-10-CM | POA: Diagnosis not present

## 2012-11-12 DIAGNOSIS — R269 Unspecified abnormalities of gait and mobility: Secondary | ICD-10-CM | POA: Insufficient documentation

## 2012-11-12 DIAGNOSIS — R609 Edema, unspecified: Secondary | ICD-10-CM | POA: Diagnosis not present

## 2012-11-12 DIAGNOSIS — Z96659 Presence of unspecified artificial knee joint: Secondary | ICD-10-CM | POA: Diagnosis not present

## 2012-11-12 DIAGNOSIS — IMO0001 Reserved for inherently not codable concepts without codable children: Secondary | ICD-10-CM | POA: Diagnosis not present

## 2012-11-12 DIAGNOSIS — M25569 Pain in unspecified knee: Secondary | ICD-10-CM | POA: Diagnosis not present

## 2012-11-12 DIAGNOSIS — M25669 Stiffness of unspecified knee, not elsewhere classified: Secondary | ICD-10-CM | POA: Diagnosis not present

## 2012-11-13 ENCOUNTER — Ambulatory Visit: Payer: Medicare Other | Admitting: Physical Therapy

## 2012-11-13 DIAGNOSIS — Z96659 Presence of unspecified artificial knee joint: Secondary | ICD-10-CM | POA: Diagnosis not present

## 2012-11-13 DIAGNOSIS — Z471 Aftercare following joint replacement surgery: Secondary | ICD-10-CM | POA: Diagnosis not present

## 2012-11-15 ENCOUNTER — Ambulatory Visit: Payer: Medicare Other | Admitting: Physical Therapy

## 2012-11-20 ENCOUNTER — Ambulatory Visit: Payer: Medicare Other

## 2012-11-21 ENCOUNTER — Ambulatory Visit: Payer: Medicare Other | Admitting: Physical Therapy

## 2012-11-21 ENCOUNTER — Ambulatory Visit: Payer: BC Managed Care – PPO | Admitting: Internal Medicine

## 2012-11-21 DIAGNOSIS — L608 Other nail disorders: Secondary | ICD-10-CM | POA: Diagnosis not present

## 2012-11-22 ENCOUNTER — Ambulatory Visit: Payer: Medicare Other | Admitting: Physical Therapy

## 2012-11-23 ENCOUNTER — Encounter: Payer: Self-pay | Admitting: Internal Medicine

## 2012-11-23 ENCOUNTER — Other Ambulatory Visit: Payer: Medicare Other

## 2012-11-23 ENCOUNTER — Ambulatory Visit (INDEPENDENT_AMBULATORY_CARE_PROVIDER_SITE_OTHER): Payer: Medicare Other | Admitting: Internal Medicine

## 2012-11-23 VITALS — BP 168/70 | HR 78 | Temp 98.1°F | Resp 18 | Wt 190.0 lb

## 2012-11-23 DIAGNOSIS — Z23 Encounter for immunization: Secondary | ICD-10-CM

## 2012-11-23 DIAGNOSIS — E876 Hypokalemia: Secondary | ICD-10-CM | POA: Diagnosis not present

## 2012-11-23 NOTE — Progress Notes (Signed)
  Subjective:    Patient ID: Kara Diaz, female    DOB: 06-23-1940, 72 y.o.   MRN: [id number]  HPI: Pt states that she is finally feeling better from her TKA in August. She states that her potassium was dangerously low prior to her surgery and required IV replacement. She has had no problems since then as far as she is aware.   She states that her BP has been well controlled ranging within 130-140/80.  She has no other complaints at this time.  Pt has never had the varicella vaccine, Pneumovax or adult booster TDAP. She also has not had her influenza vaccine for this year.     Review of Systems  Constitutional: Negative.   HENT: Negative.   Eyes: Negative.   Respiratory: Negative.   Cardiovascular: Negative.   Gastrointestinal: Negative.   Endocrine: Negative.   Genitourinary: Negative.   Skin: Negative.   Allergic/Immunologic: Negative.   Neurological: Negative.   Hematological: Negative.   Psychiatric/Behavioral: Negative.        Objective:   Physical Exam  Constitutional: She is oriented to person, place, and time. She appears well-developed and well-nourished.  HENT:  Head: Normocephalic and atraumatic.  Eyes: Conjunctivae and EOM are normal. Pupils are equal, round, and reactive to light. No scleral icterus.  Neck: Normal range of motion. Neck supple. No JVD present. No thyromegaly present.  Cardiovascular: Normal rate and regular rhythm.  Exam reveals no gallop and no friction rub.   No murmur heard. Pulmonary/Chest: Effort normal and breath sounds normal. She has no wheezes. She has no rales.  Abdominal: Soft. Bowel sounds are normal. She exhibits no distension and no mass. There is no tenderness.  Musculoskeletal: Normal range of motion.  Lymphadenopathy:    She has no cervical adenopathy.  Neurological: She is alert and oriented to person, place, and time. No cranial nerve deficit.  Skin: Skin is warm and dry.  Psychiatric: She has a normal mood and affect.  Her behavior is normal. Judgment and thought content normal.          Assessment & Plan:  1. HTN: Pt states that her BP at home is usually 130-140/80.  Check BMET for electrolytes and renal function.  2. Hypokalemia: Will check electrolytes today  3. S/P Left TKA: Pt had TKA done at [facility] and has been in PT since August. She is curre  4. Immunization : Influenza, Pneumonia today and will return next week for Zostavax and TDAP.

## 2012-11-27 ENCOUNTER — Ambulatory Visit: Payer: Medicare Other | Admitting: Physical Therapy

## 2012-11-28 ENCOUNTER — Ambulatory Visit: Payer: Medicare Other | Admitting: Physical Therapy

## 2012-11-29 ENCOUNTER — Ambulatory Visit: Payer: Medicare Other | Admitting: Physical Therapy

## 2012-12-03 ENCOUNTER — Ambulatory Visit: Payer: Medicare Other | Admitting: Physical Therapy

## 2012-12-05 ENCOUNTER — Ambulatory Visit: Payer: Medicare Other | Admitting: Physical Therapy

## 2012-12-11 ENCOUNTER — Ambulatory Visit: Payer: Medicare Other | Attending: Orthopaedic Surgery | Admitting: Physical Therapy

## 2012-12-11 DIAGNOSIS — M25569 Pain in unspecified knee: Secondary | ICD-10-CM | POA: Diagnosis not present

## 2012-12-11 DIAGNOSIS — R609 Edema, unspecified: Secondary | ICD-10-CM | POA: Insufficient documentation

## 2012-12-11 DIAGNOSIS — R269 Unspecified abnormalities of gait and mobility: Secondary | ICD-10-CM | POA: Insufficient documentation

## 2012-12-11 DIAGNOSIS — M6281 Muscle weakness (generalized): Secondary | ICD-10-CM | POA: Diagnosis not present

## 2012-12-11 DIAGNOSIS — Z96659 Presence of unspecified artificial knee joint: Secondary | ICD-10-CM | POA: Diagnosis not present

## 2012-12-11 DIAGNOSIS — IMO0001 Reserved for inherently not codable concepts without codable children: Secondary | ICD-10-CM | POA: Diagnosis not present

## 2012-12-11 DIAGNOSIS — M25669 Stiffness of unspecified knee, not elsewhere classified: Secondary | ICD-10-CM | POA: Diagnosis not present

## 2012-12-13 ENCOUNTER — Ambulatory Visit: Payer: Medicare Other | Admitting: Physical Therapy

## 2012-12-18 ENCOUNTER — Ambulatory Visit: Payer: Medicare Other

## 2012-12-20 ENCOUNTER — Ambulatory Visit: Payer: Medicare Other | Admitting: Physical Therapy

## 2013-01-18 ENCOUNTER — Telehealth: Payer: Self-pay | Admitting: Hematology

## 2013-01-18 ENCOUNTER — Telehealth: Payer: Self-pay | Admitting: *Deleted

## 2013-01-18 ENCOUNTER — Other Ambulatory Visit: Payer: Self-pay | Admitting: Hematology

## 2013-01-18 NOTE — Telephone Encounter (Signed)
Prescription refill request taken and processed via telephone.  Spoke with Kara Diaz.  Medications refilled were Potassium Chlor 33meq QTY:90 with 1 refill, Amlodipin/Benazepril 10/20mg  QTY:90 with 1 refill, Triamterene/Hctz 37.5/25  take QTY:90 with 1 refill, per original prescriptions.

## 2013-01-18 NOTE — Telephone Encounter (Signed)
Rx request for 90 day supply [facility]  Potassium Chloride 20 meq  Benazepril 10/20 mg Triamterene /HTCZ 37.5/25

## 2013-02-13 DIAGNOSIS — IMO0002 Reserved for concepts with insufficient information to code with codable children: Secondary | ICD-10-CM | POA: Diagnosis not present

## 2013-02-13 DIAGNOSIS — Z96659 Presence of unspecified artificial knee joint: Secondary | ICD-10-CM | POA: Diagnosis not present

## 2013-02-13 DIAGNOSIS — Z471 Aftercare following joint replacement surgery: Secondary | ICD-10-CM | POA: Diagnosis not present

## 2013-02-13 DIAGNOSIS — M1710 Unilateral primary osteoarthritis, unspecified knee: Secondary | ICD-10-CM | POA: Diagnosis not present

## 2013-02-13 DIAGNOSIS — M25469 Effusion, unspecified knee: Secondary | ICD-10-CM | POA: Diagnosis not present

## 2013-03-26 ENCOUNTER — Other Ambulatory Visit: Payer: Self-pay | Admitting: Oncology

## 2013-03-26 DIAGNOSIS — Z853 Personal history of malignant neoplasm of breast: Secondary | ICD-10-CM

## 2013-04-02 ENCOUNTER — Telehealth: Payer: Self-pay | Admitting: Oncology

## 2013-04-02 NOTE — Telephone Encounter (Signed)
, °

## 2013-04-05 ENCOUNTER — Other Ambulatory Visit: Payer: BLUE CROSS/BLUE SHIELD

## 2013-04-05 ENCOUNTER — Other Ambulatory Visit (HOSPITAL_BASED_OUTPATIENT_CLINIC_OR_DEPARTMENT_OTHER): Payer: Medicare Other

## 2013-04-05 ENCOUNTER — Ambulatory Visit: Payer: BLUE CROSS/BLUE SHIELD | Admitting: Oncology

## 2013-04-05 ENCOUNTER — Ambulatory Visit (HOSPITAL_BASED_OUTPATIENT_CLINIC_OR_DEPARTMENT_OTHER): Payer: Medicare Other | Admitting: Hematology and Oncology

## 2013-04-05 VITALS — BP 171/80 | HR 73 | Temp 98.3°F | Resp 18 | Ht 64.0 in | Wt 205.4 lb

## 2013-04-05 DIAGNOSIS — D0590 Unspecified type of carcinoma in situ of unspecified breast: Secondary | ICD-10-CM

## 2013-04-05 DIAGNOSIS — M009 Pyogenic arthritis, unspecified: Secondary | ICD-10-CM | POA: Diagnosis not present

## 2013-04-05 DIAGNOSIS — Z170 Estrogen receptor positive status [ER+]: Secondary | ICD-10-CM | POA: Diagnosis not present

## 2013-04-05 DIAGNOSIS — D0510 Intraductal carcinoma in situ of unspecified breast: Secondary | ICD-10-CM

## 2013-04-05 DIAGNOSIS — E876 Hypokalemia: Secondary | ICD-10-CM

## 2013-04-05 LAB — CBC WITH DIFFERENTIAL/PLATELET
BASO%: 0.5 % (ref 0.0–2.0)
BASOS ABS: 0 10*3/uL (ref 0.0–0.1)
EOS ABS: 0.1 10*3/uL (ref 0.0–0.5)
EOS%: 1.7 % (ref 0.0–7.0)
HEMATOCRIT: 41.2 % (ref 34.8–46.6)
HEMOGLOBIN: 13.9 g/dL (ref 11.6–15.9)
LYMPH%: 26 % (ref 14.0–49.7)
MCH: 32.3 pg (ref 25.1–34.0)
MCHC: 33.7 g/dL (ref 31.5–36.0)
MCV: 96 fL (ref 79.5–101.0)
MONO#: 0.3 10*3/uL (ref 0.1–0.9)
MONO%: 7.2 % (ref 0.0–14.0)
NEUT%: 64.6 % (ref 38.4–76.8)
NEUTROS ABS: 2.7 10*3/uL (ref 1.5–6.5)
PLATELETS: 300 10*3/uL (ref 145–400)
RBC: 4.29 10*6/uL (ref 3.70–5.45)
RDW: 12.8 % (ref 11.2–14.5)
WBC: 4.2 10*3/uL (ref 3.9–10.3)
lymph#: 1.1 10*3/uL (ref 0.9–3.3)

## 2013-04-05 LAB — COMPREHENSIVE METABOLIC PANEL (CC13)
ALT: 9 U/L (ref 0–55)
ANION GAP: 11 meq/L (ref 3–11)
AST: 15 U/L (ref 5–34)
Albumin: 4.1 g/dL (ref 3.5–5.0)
Alkaline Phosphatase: 106 U/L (ref 40–150)
BILIRUBIN TOTAL: 1.31 mg/dL — AB (ref 0.20–1.20)
BUN: 13.1 mg/dL (ref 7.0–26.0)
CO2: 30 meq/L — AB (ref 22–29)
Calcium: 10.7 mg/dL — ABNORMAL HIGH (ref 8.4–10.4)
Chloride: 104 mEq/L (ref 98–109)
Creatinine: 0.7 mg/dL (ref 0.6–1.1)
GLUCOSE: 66 mg/dL — AB (ref 70–140)
Potassium: 3.1 mEq/L — ABNORMAL LOW (ref 3.5–5.1)
SODIUM: 144 meq/L (ref 136–145)
TOTAL PROTEIN: 8.1 g/dL (ref 6.4–8.3)

## 2013-04-05 MED ORDER — POTASSIUM CHLORIDE CRYS ER 20 MEQ PO TBCR: 20.0000 meq | EXTENDED_RELEASE_TABLET | Freq: Two times a day (BID) | ORAL | Status: DC

## 2013-04-06 ENCOUNTER — Encounter: Payer: Self-pay | Admitting: Hematology and Oncology

## 2013-04-06 NOTE — Progress Notes (Signed)
[facility]  OFFICE PROGRESS NOTE  CC  MATTHEWS,MICHELLE A., MD [place] [id number] [address] Erroll [address] Dr. 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 The final pathology revealed high-grade DCIS measuring 0.5 cm. The tumor was ER +80% PR +70%.  #2 patient will complete radiation therapy next week on 07/11/2011.  #3 patient will begin antiestrogen therapy with Aromasin 25 mg daily as a chemoprevention for ipsilateral and contralateral breasts On 07/14/2011.. A total of 5 years of therapy is planned.  CURRENT THERAPY: Aromasin 25 mg daily to begin on 07/14/2011.  INTERVAL HISTORY: Kara Diaz 73 y.o. female returns for followup annual  visit. Overall patient is doing well she is tolerating Aromasin She does have some aches and pains but she also has underlying arthritis.She is recovering from her right knee replacement surgery 08/2012. Still has significant difficulty moving around.She had a prior left knee surgery.   She denies any nausea vomiting fevers night sweats headaches she occasionally does have hot flashes. They're not incapacitating. She has no vaginal bleeding or discharge. No abdominal pain or diarrhea no peripheral paresthesias.She has regained 20 pounds she states over the past few months.   Remainder of the 10 point review of systems is negative.   MEDICAL HISTORY: Past Medical History  Diagnosis Date  . Angina   . PONV (postoperative nausea and vomiting)   . Hypertension     takes Dyazide and Lotrel daily  . Hyperlipidemia     doesn't take any meds for this  . Pneumonia     hx of in 1992  . Joint pain   . Joint swelling   . Arthritis     all over  . Hemorrhoids   . Urinary frequency   . Early cataracts, bilateral   . Insomnia     but doesn't take anything for this  . Breast cancer  03/2011    high grade ductal carcinoma in situ left breast  . History of radiation therapy 06/07/11-07/11/11    left breast 50Gy/28fx    ALLERGIES:  has No Known Allergies.  MEDICATIONS:  Current Outpatient Prescriptions  Medication Sig Dispense Refill  . amLODipine-benazepril (LOTREL) 10-20 MG per capsule Take 1 capsule by mouth daily.  90 capsule  1  . aspirin EC 81 MG tablet Take 81 mg by mouth daily.      . Calcium 600-200 MG-UNIT per tablet Take 1 tablet by mouth daily.      . Cholecalciferol (VITAMIN D3) 2000 UNITS TABS Take 1 tablet by mouth daily.      . cycloSPORINE (RESTASIS) 0.05 % ophthalmic emulsion Place 1 drop into both eyes 2 (two) times daily.      Marland Kitchen exemestane (AROMASIN) 25 MG tablet Take 1 tablet (25 mg total) by mouth daily after breakfast.  90 tablet  12  . Magnesium 400 MG CAPS Take 1 tablet by mouth daily.      . Multiple Vitamins-Minerals (MULTIVITAMINS THER. W/MINERALS) TABS Take 1 tablet by mouth daily.      Marland Kitchen triamterene-hydrochlorothiazide (DYAZIDE) 37.5-25 MG per capsule Take 1 each (1 capsule total) by mouth every morning.  90 capsule  1  . vitamin C (ASCORBIC ACID) 500 MG tablet Take 500 mg by mouth daily.      . potassium chloride SA (K-DUR,KLOR-CON) 20 MEQ tablet Take 1 tablet (20  mEq total) by mouth 2 (two) times daily.  60 tablet  2   No current facility-administered medications for this visit.    SURGICAL HISTORY:  Past Surgical History  Procedure Laterality Date  . Rotator cuff surgery  1998    right  . Tonsillectomy      as a child  . Myomectomy      early 66's  . Cholecystectomy  late 90's  . Cardiac catheterization  12/16/10  . Colonoscopy    . Breast lumpectomy      left breast lumpectomy  . Abdominal hysterectomy  1980    partial hysterectomy  . Joint replacement Right 11/2011    knee replacement    REVIEW OF SYSTEMS:  Pertinent items are noted in HPI.   PHYSICAL EXAMINATION: General appearance: alert, cooperative and appears stated  age Lymph nodes: Cervical, supraclavicular, and axillary nodes normal. Resp: clear to auscultation bilaterally and normal percussion bilaterally Back: symmetric, no curvature. ROM normal. No CVA tenderness. Cardio: regular rate and rhythm, S1, S2 normal, no murmur, click, rub or gallop GI: soft, non-tender; bowel sounds normal; no masses,  no organomegaly Extremities: extremities normal, atraumatic, no cyanosis or edema Neurologic: Grossly normalLimited ROM right knee.  ECOG PERFORMANCE STATUS: 1 - Symptomatic but completely ambulatory  Blood pressure 171/80, pulse 73, temperature 98.3 F (36.8 C), temperature source Oral, resp. rate 18, height 5\' 4"  (1.626 m), weight 205 lb 6.4 oz (93.169 kg).  LABORATORY DATA: Lab Results  Component Value Date   WBC 4.2 04/05/2013   HGB 13.9 04/05/2013   HCT 41.2 04/05/2013   MCV 96.0 04/05/2013   PLT 300 04/05/2013      Chemistry      Component Value Date/Time   NA 144 04/05/2013 0936   NA 141 05/13/2011 1357   K 3.1* 04/05/2013 0936   K 3.8 05/13/2011 1357   CL 106 04/06/2012 1128   CL 102 05/13/2011 1357   CO2 30* 04/05/2013 0936   CO2 31 05/13/2011 1357   BUN 13.1 04/05/2013 0936   BUN 18 05/13/2011 1357   CREATININE 0.7 04/05/2013 0936   CREATININE 0.55 05/13/2011 1357      Component Value Date/Time   CALCIUM 10.7* 04/05/2013 0936   CALCIUM 10.1 05/13/2011 1357   ALKPHOS 106 04/05/2013 0936   ALKPHOS 91 05/13/2011 1357   AST 15 04/05/2013 0936   AST 31 05/13/2011 1357   ALT 9 04/05/2013 0936   ALT 22 05/13/2011 1357   BILITOT 1.31* 04/05/2013 0936   BILITOT 0.6 05/13/2011 1357     Diagnosis Breast, lumpectomy, Left - RESIDUAL INTERMEDIATE TO HIGH GRADE DCIS, APPROXIMATELY 0.5 CM IN DIMENSION, STATUS POST BIOPSY. - SEE COMMENT. Microscopic Comment BREAST, IN SITU CARCINOMA Specimen, including laterality: Left breast. Procedure: Excision with wire guided localization. Grade of carcinoma: Intermediate to high grade. Necrosis: Present. Estimated tumor  size: (gross measurement): 0.5 cm Treatment effect: N/A Distance to closest margin: Intermediate grade DCIS comes to within 0.4 cm of inferior margin (closest margin). Remaining margins 1 cm or greater. Breast prognostic profile: Estrogen receptor: 8% ([id number]) Progesterone receptor: 7% ([id number]) Lymph nodes: None received. TNM: pTis, pNX Comments: Sections show a stellate fibrotic scar area containing foreign material consistent with a previous biopsy site. At the periphery of the scar, there are multiple foci of intermediate to high grade DCIS with focal necrosis. The DCIS is present in four of five paraffin blocks representing the entirety of the scar/biopsy site and spans a distance of approximately 0.5  cm (estimated gross measurement). Additional clinical correlation with radiographic studies is recommended for the estimation of the lesional size. ER/PR studies were performed on the recent biopsy specimen ([id number]). (RM:gt, 05/10/11) Otho Darner M.D. Ph.D. Pathologist, Electronic Signature (Case signed 05/10/2011) Specimen Gross and Clinical Information 1 of  ASSESSMENT: 73 year old female with:  1.  high-grade DCIS measuring about 0.5 cm. She is now status post lumpectomy. Postoperatively she is doing well.Completed radiation therapy to the left breast on 07/11/2011. Overall she  tolerated the radiation well. After this she started Aromasin 25 mg daily on 07/14/2011. Risks and benefits of Aromasin were discussed with her as well as literature was given to her. Patient is doing well no evidence of recurrent disease.  PLAN:    #1 she will continue to take Aromasin refill ordered.. A total of 5 years of therapy is planned.  #2 she will be seen back in 12 months time or sooner if need arise,will check CBC,CMP upon return.  She needs Bone density and patient agreed to schedule.That will be her baseline  Also scheduled her for mammogram in 04/2013.  As labs became  available later, after patient left ,K was 3.1 and called   her to take Kdur 20 meq 2 daily.Will monitor in 2 weeks  Calcium  normal previously up.  Also total bilirubin up  1.31 patient will have CMP recheck in 2 weeks and will need further evaluation if bilirubin still elevated.  Upon return for labs U added TSH(weight gain,weakness) and Vit D level since no recent evaluation available             All questions were answered. The patient knows to call the clinic with any problems, questions or concerns. We can certainly see the patient much sooner if necessary.  I spent 25 minutes counseling the patient face to face. The total time spent in the appointment was 30 minutes.    Amada Kingfisher, M.D. Oncology/Hematology [place] [phone] (Office)  04/06/2013, 11:58 AM

## 2013-04-08 ENCOUNTER — Other Ambulatory Visit: Payer: Self-pay

## 2013-04-08 ENCOUNTER — Telehealth: Payer: Self-pay

## 2013-04-08 ENCOUNTER — Telehealth: Payer: Self-pay | Admitting: Oncology

## 2013-04-08 DIAGNOSIS — D0510 Intraductal carcinoma in situ of unspecified breast: Secondary | ICD-10-CM

## 2013-04-08 MED ORDER — EXEMESTANE 25 MG PO TABS: 25.0000 mg | ORAL_TABLET | Freq: Every day | ORAL | Status: DC

## 2013-04-08 NOTE — Telephone Encounter (Signed)
s.w. pt and advised on April appts and March 2016 appt....pt ok and aware

## 2013-04-08 NOTE — Telephone Encounter (Signed)
Called to inform patient Dr.Faidas ordered Potassium 20 Meq BID, was sent to patients pharmacy, pharmacy verified with patient. Informed patient her potassium was low at 3.1 and informed her scheduling would be calling to do a follow up lab in 2 weeks to recheck her potassium level. Patient inquired about her Aromasin being refilled which was discussed in the follow up visit on Friday. Informed patient it would be verified and then sent into her pharmacy. Patient verbalized understanding, denies any questions or concerns at this time. Patient to call back with any questions or concerns.

## 2013-04-08 NOTE — Telephone Encounter (Signed)
Per MD note of 04/05/13, refill sent for Aromasin.  Pt voiced understanding.

## 2013-04-17 DIAGNOSIS — H524 Presbyopia: Secondary | ICD-10-CM | POA: Diagnosis not present

## 2013-04-17 DIAGNOSIS — H5210 Myopia, unspecified eye: Secondary | ICD-10-CM | POA: Diagnosis not present

## 2013-04-17 DIAGNOSIS — H25019 Cortical age-related cataract, unspecified eye: Secondary | ICD-10-CM | POA: Diagnosis not present

## 2013-04-17 DIAGNOSIS — H04129 Dry eye syndrome of unspecified lacrimal gland: Secondary | ICD-10-CM | POA: Diagnosis not present

## 2013-04-17 DIAGNOSIS — H52209 Unspecified astigmatism, unspecified eye: Secondary | ICD-10-CM | POA: Diagnosis not present

## 2013-04-17 DIAGNOSIS — H2510 Age-related nuclear cataract, unspecified eye: Secondary | ICD-10-CM | POA: Diagnosis not present

## 2013-04-24 ENCOUNTER — Ambulatory Visit: Payer: BC Managed Care – PPO | Admitting: Internal Medicine

## 2013-04-25 ENCOUNTER — Ambulatory Visit (INDEPENDENT_AMBULATORY_CARE_PROVIDER_SITE_OTHER): Payer: Medicare Other | Admitting: Internal Medicine

## 2013-04-25 VITALS — BP 146/61 | HR 61 | Temp 98.0°F | Resp 20 | Ht 64.0 in | Wt 206.0 lb

## 2013-04-25 DIAGNOSIS — R7309 Other abnormal glucose: Secondary | ICD-10-CM | POA: Diagnosis not present

## 2013-04-25 DIAGNOSIS — R002 Palpitations: Secondary | ICD-10-CM

## 2013-04-25 DIAGNOSIS — Z0000 Encounter for general adult medical examination without abnormal findings: Secondary | ICD-10-CM

## 2013-04-25 DIAGNOSIS — E785 Hyperlipidemia, unspecified: Secondary | ICD-10-CM

## 2013-04-25 DIAGNOSIS — E559 Vitamin D deficiency, unspecified: Secondary | ICD-10-CM | POA: Insufficient documentation

## 2013-04-25 DIAGNOSIS — R739 Hyperglycemia, unspecified: Secondary | ICD-10-CM

## 2013-04-25 NOTE — Progress Notes (Signed)
   Subjective:    Patient ID: Kara Diaz, female    DOB: August 12, 1940, 73 y.o.   MRN: [id number]  HPI: Pt here for Annual Physical and follow up on her HTN. Pt also needs to discuss elevated lipids and use of statins for treatment. She states that she was see in follow-up by Oncologist Dr. Owens Loffler at which time a Dexa-scan was ordered.     Review of Systems  Constitutional: Negative.   HENT: Negative.   Eyes: Negative.   Respiratory: Negative.   Cardiovascular: Negative.   Gastrointestinal: Negative.   Endocrine: Negative.   Genitourinary: Negative.   Skin: Negative.   Allergic/Immunologic: Negative.   Neurological: Negative.   Hematological: Negative.   Psychiatric/Behavioral: Negative.        Objective:   Physical Exam  Vitals reviewed. Constitutional: She is oriented to person, place, and time. She appears well-developed and well-nourished.  HENT:  Head: Normocephalic and atraumatic.  Eyes: Conjunctivae and EOM are normal. Pupils are equal, round, and reactive to light. No scleral icterus.  Neck: Normal range of motion. Neck supple. No JVD present. No thyromegaly present.  Cardiovascular: Normal rate and regular rhythm.  Exam reveals no gallop and no friction rub.   No murmur heard. Pulmonary/Chest: Effort normal and breath sounds normal. She has no wheezes. She has no rales.  Abdominal: Soft. Bowel sounds are normal. She exhibits no distension and no mass. There is no tenderness.  Musculoskeletal: Normal range of motion.  Neurological: She is alert and oriented to person, place, and time. No cranial nerve deficit.  Skin: Skin is warm and dry.  Psychiatric: She has a normal mood and affect. Her behavior is normal. Judgment and thought content normal.          Assessment & Plan:  1. Annual Visit: Pt has had colonoscopy in the last 5 years. She is considering the Zostavax and will let me know about it in the next 2 weeks.  - Pt has a dexa scan ordered. Will check  vitamin D levels today.  - Pt has had a hysterectomy. Pap not indicated  - Mammogram scheduled for tomorrow.  2. Hypokalemia: Dose has been ordered by Oncologist.   3. HTN: At goal per JNC VIII. Continue current medications.  4. Hyperlipidemia: Pt has a history of hyperlipidemia and has refused medications so far. However she will consider antilipidemic treatment. Will check Lipids and determine dose and choice of medications.  5. Obesity: Pt exercising. Pt will resume weight watchers.  6. Hyperglycemia: Check Hb A1c  7. Palpitations: Pt has occasional palpitaitons but has not been bothered by them recently. She reports a cardiac cath by Dr. Terrence Dupont in 2012 and reports that "everything was okay".     Labs: TSH, Hb A1c, Lipid panel  RTC: 4 weeks

## 2013-04-25 NOTE — Progress Notes (Signed)
Attempted to draw TSH, Hemoglobin A1C, vitamin D.  After two attempts, unable to collect.  Patient has to return for fasting lipid, will attempt to collect others at that time.  Patient in agreement and verbalizes she will call to schedule.

## 2013-04-26 ENCOUNTER — Ambulatory Visit (INDEPENDENT_AMBULATORY_CARE_PROVIDER_SITE_OTHER): Payer: Medicare Other | Admitting: Surgery

## 2013-04-26 ENCOUNTER — Ambulatory Visit
Admission: RE | Admit: 2013-04-26 | Discharge: 2013-04-26 | Disposition: A | Discharge status: Home / Self Care | Payer: Medicare Other | Source: Ambulatory Visit | Attending: Oncology | Admitting: Oncology

## 2013-04-26 ENCOUNTER — Encounter (INDEPENDENT_AMBULATORY_CARE_PROVIDER_SITE_OTHER): Payer: Self-pay | Admitting: Surgery

## 2013-04-26 VITALS — BP 135/75 | HR 66 | Temp 97.6°F | Resp 14 | Ht 63.0 in | Wt 208.2 lb

## 2013-04-26 DIAGNOSIS — Z853 Personal history of malignant neoplasm of breast: Secondary | ICD-10-CM

## 2013-04-26 DIAGNOSIS — R928 Other abnormal and inconclusive findings on diagnostic imaging of breast: Secondary | ICD-10-CM | POA: Diagnosis not present

## 2013-04-26 NOTE — Progress Notes (Signed)
NAME: Kara Diaz       DOB: 12-31-1940           DATE: 04/26/2013       MRN: [id number]  CC:   Chief Complaint  Patient presents with  . Breast Cancer Long Term Follow Up    Kara Diaz is a 73 y.o.Marland Kitchenfemale who presents for routine followup of her Right  Breast DCIS diagnosed in 03/2011 and treated with LUMPECTOMY . She has no problems or concerns on either side. HAD BILATERAL KNEE REPLACEMENT AND DOING WELL.   PFSH: She has had no significant changes since the last visit here.  ROS: There have been no significant changes since the last visit here  EXAM:  VS: BP 135/75  Pulse 66  Temp(Src) 97.6 F (36.4 C)  Resp 14  Ht 5\' 3"  (1.6 m)  Wt 208 lb 3.2 oz (94.439 kg)  BMI 36.89 kg/m2  General: The patient is alert, oriented, generally healthy appearing, NAD. Mood and affect are normal.  Breasts:  right breast scar well healed.  No masses bilaterally.  Pendulous.  Nipples normal bilateral.    Lymphatics: She has no axillary or supraclavicular adenopathy on either side.  Extremities: Full ROM of the surgical side with no lymphedema noted.  Data Reviewed: Oncology noted CLINICAL DATA: 73 year old female with history of left breast  cancer post lumpectomy in 2013 followed by radiation therapy.  EXAM:  DIGITAL DIAGNOSTIC LEFT MAMMOGRAM WITH CAD  COMPARISON: Previous exams.  ACR Breast Density Category b: There are scattered areas of  fibroglandular density.  FINDINGS:  No suspicious masses or calcifications are seen in either breast.  Postsurgical changes are again seen in the left breast from prior  lumpectomy. A spot compression magnification tangential view of the  lumpectomy site in the left breast was performed. There is no  mammographic evidence of recurrent malignancy at the lumpectomy site  in the left breast.  Mammographic images were processed with CAD.  IMPRESSION:  No mammographic evidence of malignancy in either breast.  RECOMMENDATION:   Diagnostic mammogram is suggested in 1 year. (Code:[id number]-B-01Y)  I have discussed the findings and recommendations with the patient.  Results were also provided in writing at the conclusion of the  visit. If applicable, a reminder letter will be sent to the patient  regarding the next appointment.  BI-RADS CATEGORY 2: Benign.  Electronically Signed  By: Everlean Alstrom M.D.  On: 04/26/2013 10:05   Impression: Doing well, with no evidence of recurrent cancer or new cancer  Plan: Will continue to follow up on an annual basis here.

## 2013-04-26 NOTE — Patient Instructions (Signed)
Return 1 year. 

## 2013-04-29 ENCOUNTER — Other Ambulatory Visit: Payer: Self-pay | Admitting: *Deleted

## 2013-04-29 DIAGNOSIS — D0510 Intraductal carcinoma in situ of unspecified breast: Secondary | ICD-10-CM

## 2013-04-29 DIAGNOSIS — Z853 Personal history of malignant neoplasm of breast: Secondary | ICD-10-CM

## 2013-04-30 ENCOUNTER — Ambulatory Visit
Admission: RE | Admit: 2013-04-30 | Discharge: 2013-04-30 | Disposition: A | Discharge status: Home / Self Care | Payer: BC Managed Care – PPO | Source: Ambulatory Visit | Attending: Hematology and Oncology | Admitting: Hematology and Oncology

## 2013-04-30 ENCOUNTER — Other Ambulatory Visit: Payer: Self-pay | Admitting: Internal Medicine

## 2013-04-30 ENCOUNTER — Other Ambulatory Visit (HOSPITAL_BASED_OUTPATIENT_CLINIC_OR_DEPARTMENT_OTHER): Payer: Medicare Other

## 2013-04-30 ENCOUNTER — Other Ambulatory Visit: Payer: Self-pay | Admitting: *Deleted

## 2013-04-30 DIAGNOSIS — D0510 Intraductal carcinoma in situ of unspecified breast: Secondary | ICD-10-CM

## 2013-04-30 DIAGNOSIS — M949 Disorder of cartilage, unspecified: Secondary | ICD-10-CM | POA: Diagnosis not present

## 2013-04-30 DIAGNOSIS — D0590 Unspecified type of carcinoma in situ of unspecified breast: Secondary | ICD-10-CM | POA: Diagnosis not present

## 2013-04-30 DIAGNOSIS — R002 Palpitations: Secondary | ICD-10-CM | POA: Diagnosis not present

## 2013-04-30 DIAGNOSIS — E559 Vitamin D deficiency, unspecified: Secondary | ICD-10-CM | POA: Diagnosis not present

## 2013-04-30 DIAGNOSIS — Z853 Personal history of malignant neoplasm of breast: Secondary | ICD-10-CM

## 2013-04-30 DIAGNOSIS — E785 Hyperlipidemia, unspecified: Secondary | ICD-10-CM | POA: Diagnosis not present

## 2013-04-30 DIAGNOSIS — E876 Hypokalemia: Secondary | ICD-10-CM

## 2013-04-30 DIAGNOSIS — M899 Disorder of bone, unspecified: Secondary | ICD-10-CM | POA: Diagnosis not present

## 2013-04-30 DIAGNOSIS — R7309 Other abnormal glucose: Secondary | ICD-10-CM | POA: Diagnosis not present

## 2013-04-30 DIAGNOSIS — M1712 Unilateral primary osteoarthritis, left knee: Secondary | ICD-10-CM

## 2013-04-30 LAB — COMPREHENSIVE METABOLIC PANEL (CC13)
ALT: 8 U/L (ref 0–55)
AST: 18 U/L (ref 5–34)
Albumin: 4.2 g/dL (ref 3.5–5.0)
Alkaline Phosphatase: 102 U/L (ref 40–150)
Anion Gap: 11 mEq/L (ref 3–11)
BUN: 12.7 mg/dL (ref 7.0–26.0)
CALCIUM: 11 mg/dL — AB (ref 8.4–10.4)
CHLORIDE: 107 meq/L (ref 98–109)
CO2: 30 mEq/L — ABNORMAL HIGH (ref 22–29)
Creatinine: 0.7 mg/dL (ref 0.6–1.1)
Glucose: 71 mg/dl (ref 70–140)
Potassium: 3.5 mEq/L (ref 3.5–5.1)
SODIUM: 148 meq/L — AB (ref 136–145)
TOTAL PROTEIN: 8.2 g/dL (ref 6.4–8.3)
Total Bilirubin: 1.01 mg/dL (ref 0.20–1.20)

## 2013-04-30 LAB — CBC WITH DIFFERENTIAL/PLATELET
BASO%: 0.5 % (ref 0.0–2.0)
Basophils Absolute: 0 10*3/uL (ref 0.0–0.1)
EOS%: 3.6 % (ref 0.0–7.0)
Eosinophils Absolute: 0.1 10*3/uL (ref 0.0–0.5)
HCT: 42.6 % (ref 34.8–46.6)
HGB: 14 g/dL (ref 11.6–15.9)
LYMPH#: 0.8 10*3/uL — AB (ref 0.9–3.3)
LYMPH%: 31 % (ref 14.0–49.7)
MCH: 32 pg (ref 25.1–34.0)
MCHC: 32.9 g/dL (ref 31.5–36.0)
MCV: 97.3 fL (ref 79.5–101.0)
MONO#: 0.2 10*3/uL (ref 0.1–0.9)
MONO%: 7 % (ref 0.0–14.0)
NEUT#: 1.6 10*3/uL (ref 1.5–6.5)
NEUT%: 57.9 % (ref 38.4–76.8)
Platelets: 321 10*3/uL (ref 145–400)
RBC: 4.38 10*6/uL (ref 3.70–5.45)
RDW: 13.2 % (ref 11.2–14.5)
WBC: 2.7 10*3/uL — AB (ref 3.9–10.3)

## 2013-05-01 ENCOUNTER — Telehealth: Payer: Self-pay | Admitting: *Deleted

## 2013-05-01 ENCOUNTER — Telehealth: Payer: Self-pay

## 2013-05-01 NOTE — Telephone Encounter (Signed)
Please contact [facility] labs this morning and obtain results for labs ordered by [place] on last visit.

## 2013-05-01 NOTE — Telephone Encounter (Signed)
Per Mendel Ryder, NP, encourage patient to drink more fluids. Patient takes Calcium once a day. Patient verbalized understanding.

## 2013-05-01 NOTE — Telephone Encounter (Signed)
Pt was contacted and VM was left about most recent labs.and was told results had not come in yet,but will contact when reviewed.

## 2013-05-01 NOTE — Telephone Encounter (Signed)
Message copied by Amelia Jo I on Wed May 01, 2013  3:24 PM ------      Message from: Minette Headland      Created: Wed May 01, 2013 12:58 AM       Patient has elevated calcium.  Please encourage her to drink more fluids.  If she is taking calcium BID, she should only take daily.              Thanks,       LC       ----- Message -----         From: [facility] Interface         Sent: 04/30/2013  11:01 AM           To: Deatra Robinson, MD                   ------

## 2013-05-02 ENCOUNTER — Telehealth: Payer: Self-pay

## 2013-05-02 ENCOUNTER — Other Ambulatory Visit (HOSPITAL_COMMUNITY): Payer: Self-pay | Admitting: Hematology

## 2013-05-02 DIAGNOSIS — E559 Vitamin D deficiency, unspecified: Secondary | ICD-10-CM

## 2013-05-02 LAB — LIPID PANEL
CHOL/HDL RATIO: 3.2 ratio
CHOLESTEROL: 297 mg/dL — AB (ref 0–200)
HDL: 94 mg/dL (ref >39)
LDL Cholesterol: 168 mg/dL — ABNORMAL HIGH (ref 0–99)
Triglycerides: 175 mg/dL — ABNORMAL HIGH (ref <150)
VLDL: 35 mg/dL (ref 0–40)

## 2013-05-02 LAB — TSH: TSH: 0.821 u[IU]/mL (ref 0.350–4.500)

## 2013-05-02 NOTE — Telephone Encounter (Signed)
Pt contacted office to alert RN of an extra tube of blood that was drawn on her most recent visit to the [facility].Pt wanted to advise RN if office needed the extra tube of blood [facility] has some on hold for the next 7 days (lab appt 05/08/2013 w/ Dr. Zigmund Daniel office)

## 2013-05-02 NOTE — Telephone Encounter (Signed)
Called and spoke with Kara Diaz in the lab at the [facility]. She confirmed that she is able to process the TSH, Vitamin D, and lipid panel, although we are not sure if patient was fasting or not.  Kara Diaz advised that these results would not be in epic, but they would be faxed over once they were complete.  Gave [phone] for any further questions.

## 2013-05-02 NOTE — Telephone Encounter (Signed)
The Hemoglobin A1C was unable to be processed as they did not have a lavender tube on hold.

## 2013-05-03 ENCOUNTER — Telehealth: Payer: Self-pay | Admitting: *Deleted

## 2013-05-03 LAB — VITAMIN D 25 HYDROXY (VIT D DEFICIENCY, FRACTURES): VIT D 25 HYDROXY: 56 ng/mL (ref 30–89)

## 2013-05-03 NOTE — Telephone Encounter (Signed)
Reviewed lab results with patient. Patient verbalized understanding.

## 2013-05-08 ENCOUNTER — Other Ambulatory Visit: Payer: Medicare Other

## 2013-05-08 ENCOUNTER — Other Ambulatory Visit: Payer: Self-pay | Admitting: Internal Medicine

## 2013-05-08 ENCOUNTER — Other Ambulatory Visit (HOSPITAL_COMMUNITY): Payer: Self-pay | Admitting: Hematology

## 2013-05-08 DIAGNOSIS — E785 Hyperlipidemia, unspecified: Secondary | ICD-10-CM | POA: Diagnosis not present

## 2013-05-08 DIAGNOSIS — R7309 Other abnormal glucose: Secondary | ICD-10-CM | POA: Diagnosis not present

## 2013-05-08 LAB — LIPID PANEL
CHOL/HDL RATIO: 3.3 ratio
CHOLESTEROL: 316 mg/dL — AB (ref 0–200)
HDL: 96 mg/dL (ref >39)
LDL Cholesterol: 200 mg/dL — ABNORMAL HIGH (ref 0–99)
Triglycerides: 101 mg/dL (ref <150)
VLDL: 20 mg/dL (ref 0–40)

## 2013-05-08 LAB — HEMOGLOBIN A1C
HEMOGLOBIN A1C: 5.2 % (ref <5.7)
Mean Plasma Glucose: 103 mg/dL (ref <117)

## 2013-05-17 ENCOUNTER — Encounter: Payer: Self-pay | Admitting: Internal Medicine

## 2013-05-21 DIAGNOSIS — L84 Corns and callosities: Secondary | ICD-10-CM | POA: Diagnosis not present

## 2013-05-21 DIAGNOSIS — L608 Other nail disorders: Secondary | ICD-10-CM | POA: Diagnosis not present

## 2013-06-06 ENCOUNTER — Ambulatory Visit (INDEPENDENT_AMBULATORY_CARE_PROVIDER_SITE_OTHER): Payer: Medicare Other | Admitting: Internal Medicine

## 2013-06-06 ENCOUNTER — Encounter: Payer: Self-pay | Admitting: Internal Medicine

## 2013-06-06 VITALS — BP 143/62 | HR 69 | Temp 98.4°F | Resp 20 | Ht 64.0 in | Wt 208.0 lb

## 2013-06-06 DIAGNOSIS — E785 Hyperlipidemia, unspecified: Secondary | ICD-10-CM | POA: Diagnosis not present

## 2013-06-06 MED ORDER — ATORVASTATIN CALCIUM 40 MG PO TABS: 40.0000 mg | ORAL_TABLET | Freq: Every day | ORAL | Status: DC

## 2013-06-06 NOTE — Progress Notes (Signed)
   Subjective:    Patient ID: Kara Diaz, female    DOB: 1940/12/05, 73 y.o.   MRN: [id number]  HPI: Pt here today to f/u on elevated choleterol. Her 10 year calculated ASCVD risk is 24.4%. She is finally willing to take statins.   Pt has recently experienced sudden loss of a close friend to death. She reports that she thinks that she is handling it well abd declines grief counseling at this time.     Review of Systems  Constitutional: Negative.   HENT: Negative.   Eyes: Negative.   Respiratory: Negative.   Cardiovascular: Negative.   Gastrointestinal: Negative.   Endocrine: Negative.   Genitourinary: Negative.   Skin: Negative.   Allergic/Immunologic: Negative.   Neurological: Negative.   Hematological: Negative.   Psychiatric/Behavioral: Positive for dysphoric mood (Associated with death of friend).       Objective:   Physical Exam  Vitals reviewed. Constitutional: She is oriented to person, place, and time. She appears well-developed and well-nourished.  HENT:  Head: Normocephalic and atraumatic.  Eyes: Conjunctivae and EOM are normal.  Not evaluated  Neck: Normal range of motion. Neck supple. No JVD present. No thyromegaly present.  Cardiovascular: Normal rate, regular rhythm and normal heart sounds.  Exam reveals no gallop and no friction rub.   No murmur heard. Pulmonary/Chest: Effort normal and breath sounds normal. She has no wheezes. She has no rales.  Abdominal: Soft. Bowel sounds are normal. She exhibits no distension and no mass. There is no tenderness.  Genitourinary:  Not evaluated  Musculoskeletal: Normal range of motion.  Neurological: She is alert and oriented to person, place, and time.  Skin: Skin is warm and dry.  Psychiatric: She has a normal mood and affect. Her behavior is normal. Judgment and thought content normal.          Assessment & Plan:  1. Hyperlipidemia: 10 yr ASCVD risk 24.4%. Will start Lipitor 40 mg daily. Check liver  enzymes in 1  Month.  2. Grief and loss: Pt just experienced the sudden loss of her close friend whom she sees on a day to  Day basis.   3. Immunizations: Pt agreeable to Zostavax. None available in clinic today. Will notify patient when immun  RTC : 5 weeks  Labs: 4 weeks

## 2013-06-10 ENCOUNTER — Encounter: Payer: Self-pay | Admitting: Internal Medicine

## 2013-07-04 ENCOUNTER — Other Ambulatory Visit: Payer: Medicare Other

## 2013-07-11 ENCOUNTER — Other Ambulatory Visit: Payer: Medicare Other

## 2013-07-11 ENCOUNTER — Telehealth: Payer: Self-pay | Admitting: Internal Medicine

## 2013-07-11 NOTE — Telephone Encounter (Signed)
Patient wants RX filled through [facility].

## 2013-07-11 NOTE — Telephone Encounter (Signed)
Prescription for Amlodinpine-benazepril 10-20mg   called [facility].  Spoke with Glory Buff, a pharmacy tech.

## 2013-07-15 ENCOUNTER — Ambulatory Visit: Payer: Medicare Other | Admitting: Internal Medicine

## 2013-07-18 ENCOUNTER — Ambulatory Visit: Payer: Medicare Other | Admitting: Internal Medicine

## 2013-07-24 ENCOUNTER — Other Ambulatory Visit: Payer: Self-pay | Admitting: *Deleted

## 2013-07-24 DIAGNOSIS — I10 Essential (primary) hypertension: Secondary | ICD-10-CM

## 2013-07-24 MED ORDER — AMLODIPINE BESY-BENAZEPRIL HCL 10-20 MG PO CAPS: 1.0000 | ORAL_CAPSULE | Freq: Every day | ORAL | Status: DC

## 2013-08-01 ENCOUNTER — Telehealth: Payer: Self-pay | Admitting: Internal Medicine

## 2013-08-01 DIAGNOSIS — I10 Essential (primary) hypertension: Secondary | ICD-10-CM

## 2013-08-01 MED ORDER — AMLODIPINE BESY-BENAZEPRIL HCL 10-20 MG PO CAPS: 1.0000 | ORAL_CAPSULE | Freq: Every day | ORAL | Status: DC

## 2013-08-01 MED ORDER — TRIAMTERENE-HCTZ 37.5-25 MG PO CAPS: 1.0000 | ORAL_CAPSULE | ORAL | Status: DC

## 2013-08-01 NOTE — Telephone Encounter (Signed)
Patient requesting RX for Amlodopine and Triamterene be faxed over to [facility] with 1 year of refills.

## 2013-08-20 DIAGNOSIS — L608 Other nail disorders: Secondary | ICD-10-CM | POA: Diagnosis not present

## 2013-08-20 DIAGNOSIS — L84 Corns and callosities: Secondary | ICD-10-CM | POA: Diagnosis not present

## 2013-08-27 IMAGING — CR DG CHEST 1V PORT
1 series · 1 of 1 positions shown · non-contrast
Comparison: Portable exam 7237 hours without priors for comparison

CLINICAL DATA: Chest pain, hypertension, former smoker

PORTABLE CHEST - 1 VIEW

[AP]
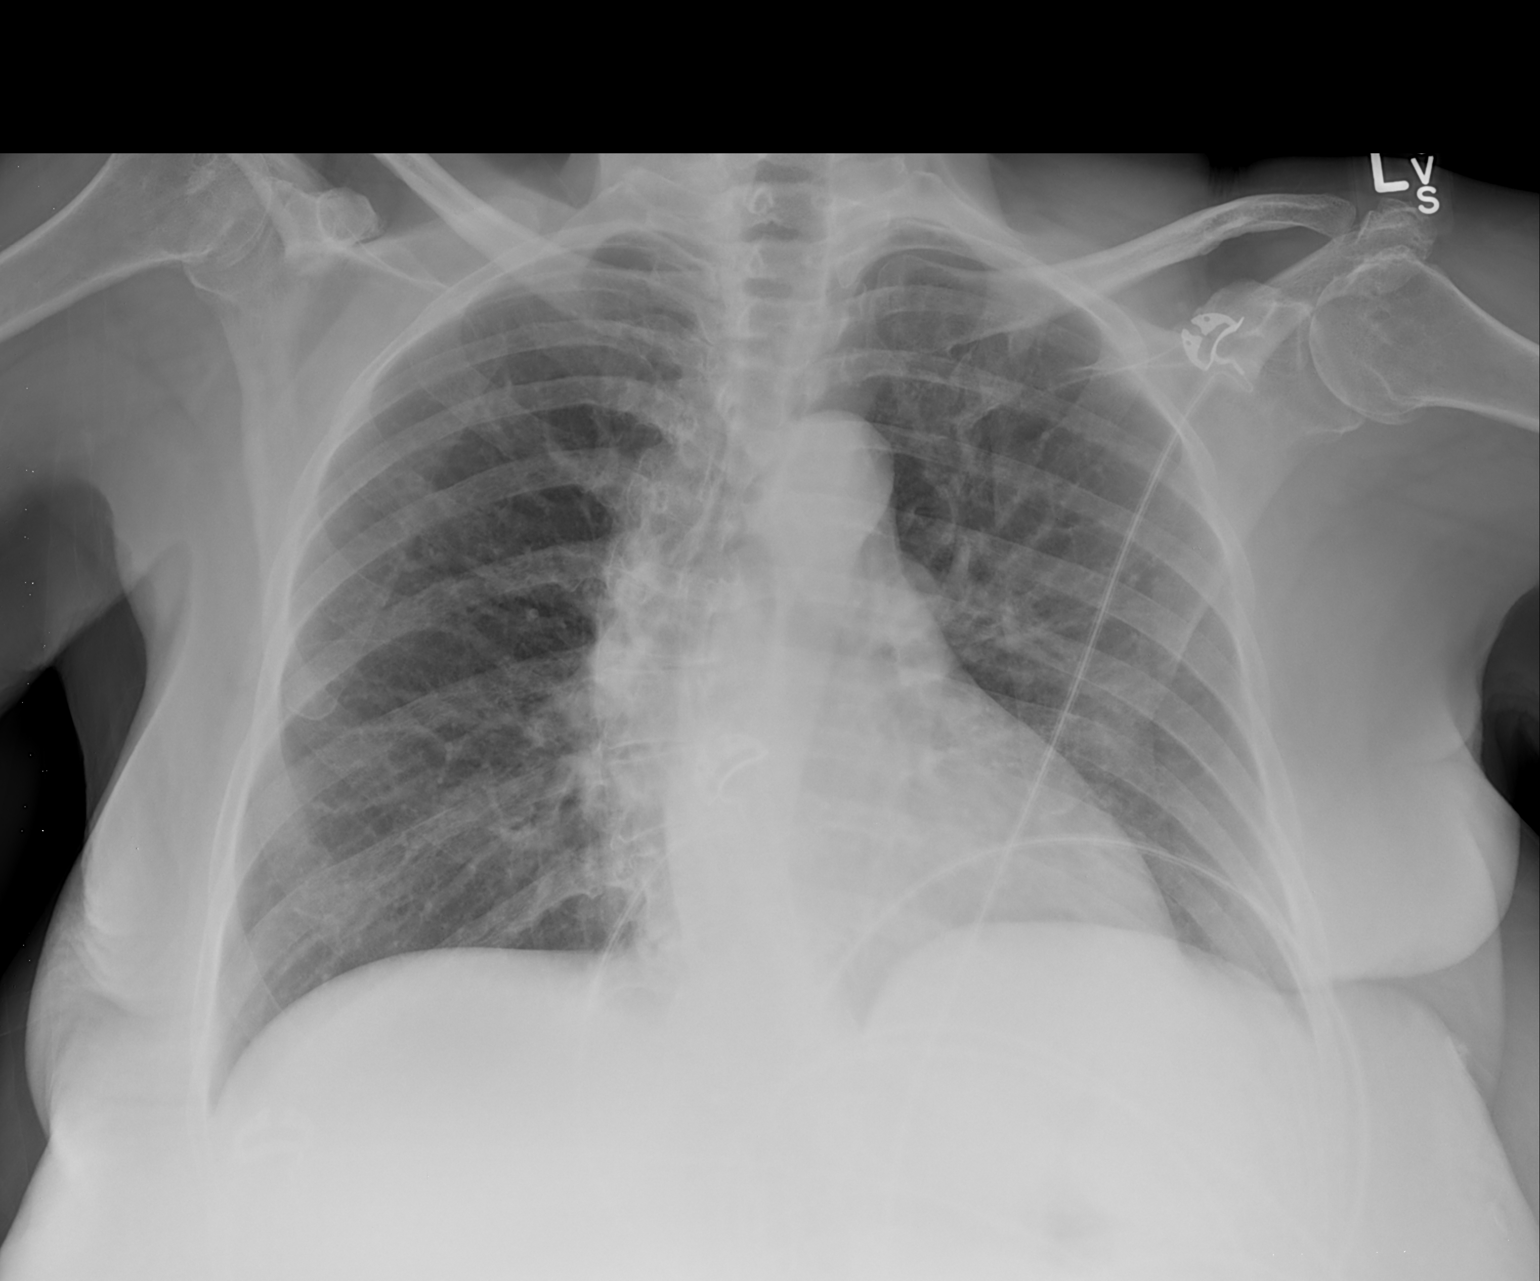

[1 of 1 positions shown; findings below may reference images not displayed]

FINDINGS: Normal heart size, mediastinal contours, and pulmonary vascularity.
Biconvex thoracolumbar scoliosis and resultant thoracic deformity.
No acute infiltrate, pleural effusion, or pneumothorax.
No acute osseous findings.
IMPRESSION: No acute abnormalities.
Scoliosis.

## 2013-08-29 ENCOUNTER — Other Ambulatory Visit: Payer: Medicare Other

## 2013-08-29 ENCOUNTER — Other Ambulatory Visit: Payer: Self-pay | Admitting: Internal Medicine

## 2013-08-29 DIAGNOSIS — E785 Hyperlipidemia, unspecified: Secondary | ICD-10-CM | POA: Diagnosis not present

## 2013-08-29 LAB — LIPID PANEL
CHOL/HDL RATIO: 2.3 ratio
Cholesterol: 204 mg/dL — ABNORMAL HIGH (ref 0–200)
HDL: 90 mg/dL (ref >39)
LDL CALC: 97 mg/dL (ref 0–99)
TRIGLYCERIDES: 84 mg/dL (ref <150)
VLDL: 17 mg/dL (ref 0–40)

## 2013-09-05 ENCOUNTER — Ambulatory Visit: Payer: Medicare Other | Admitting: Internal Medicine

## 2013-09-05 VITALS — BP 169/64 | HR 72 | Temp 98.1°F | Resp 16 | Ht 63.0 in

## 2013-09-05 DIAGNOSIS — Z23 Encounter for immunization: Secondary | ICD-10-CM

## 2013-09-05 DIAGNOSIS — E876 Hypokalemia: Secondary | ICD-10-CM

## 2013-09-05 DIAGNOSIS — I10 Essential (primary) hypertension: Secondary | ICD-10-CM

## 2013-09-05 DIAGNOSIS — T50905A Adverse effect of unspecified drugs, medicaments and biological substances, initial encounter: Secondary | ICD-10-CM

## 2013-09-05 DIAGNOSIS — T887XXA Unspecified adverse effect of drug or medicament, initial encounter: Secondary | ICD-10-CM | POA: Insufficient documentation

## 2013-09-05 MED ORDER — POTASSIUM CHLORIDE CRYS ER 20 MEQ PO TBCR: 20.0000 meq | EXTENDED_RELEASE_TABLET | Freq: Every day | ORAL | Status: DC

## 2013-10-02 ENCOUNTER — Ambulatory Visit (INDEPENDENT_AMBULATORY_CARE_PROVIDER_SITE_OTHER): Payer: Medicare Other

## 2013-10-02 DIAGNOSIS — Z0000 Encounter for general adult medical examination without abnormal findings: Secondary | ICD-10-CM

## 2013-10-02 DIAGNOSIS — Z23 Encounter for immunization: Secondary | ICD-10-CM

## 2013-11-19 DIAGNOSIS — B351 Tinea unguium: Secondary | ICD-10-CM | POA: Diagnosis not present

## 2013-11-19 DIAGNOSIS — L859 Epidermal thickening, unspecified: Secondary | ICD-10-CM | POA: Diagnosis not present

## 2013-11-20 ENCOUNTER — Telehealth: Payer: Self-pay | Admitting: Internal Medicine

## 2013-11-20 NOTE — Telephone Encounter (Signed)
Left message for patient to call to reschedule 12/03/13's appointment. Appointment cancelled due to provider scheduling.

## 2013-11-29 ENCOUNTER — Other Ambulatory Visit: Payer: Self-pay | Admitting: Internal Medicine

## 2013-11-29 DIAGNOSIS — E876 Hypokalemia: Secondary | ICD-10-CM | POA: Diagnosis not present

## 2013-11-30 LAB — COMPLETE METABOLIC PANEL WITH GFR
ALK PHOS: 102 U/L (ref 39–117)
ALT: 10 U/L (ref 0–35)
AST: 19 U/L (ref 0–37)
Albumin: 4.7 g/dL (ref 3.5–5.2)
BUN: 14 mg/dL (ref 6–23)
CO2: 30 mEq/L (ref 19–32)
CREATININE: 0.69 mg/dL (ref 0.50–1.10)
Calcium: 10.9 mg/dL — ABNORMAL HIGH (ref 8.4–10.5)
Chloride: 103 mEq/L (ref 96–112)
GFR, EST NON AFRICAN AMERICAN: 87 mL/min
Glucose, Bld: 80 mg/dL (ref 70–99)
Potassium: 3.4 mEq/L — ABNORMAL LOW (ref 3.5–5.3)
SODIUM: 144 meq/L (ref 135–145)
TOTAL PROTEIN: 7.9 g/dL (ref 6.0–8.3)
Total Bilirubin: 1 mg/dL (ref 0.2–1.2)

## 2013-12-03 ENCOUNTER — Ambulatory Visit: Payer: Medicare Other | Admitting: Family Medicine

## 2013-12-24 ENCOUNTER — Telehealth: Payer: Self-pay | Admitting: Hematology and Oncology

## 2013-12-24 NOTE — Telephone Encounter (Signed)
, °

## 2014-02-17 IMAGING — CR DG CHEST 2V
2 series · 2 of 2 positions shown · non-contrast
Comparison: 12/01/2010

CLINICAL DATA: Preop radiograph.  Left lumpectomy.

CHEST - 2 VIEW

[view not recorded (1 of 2)]
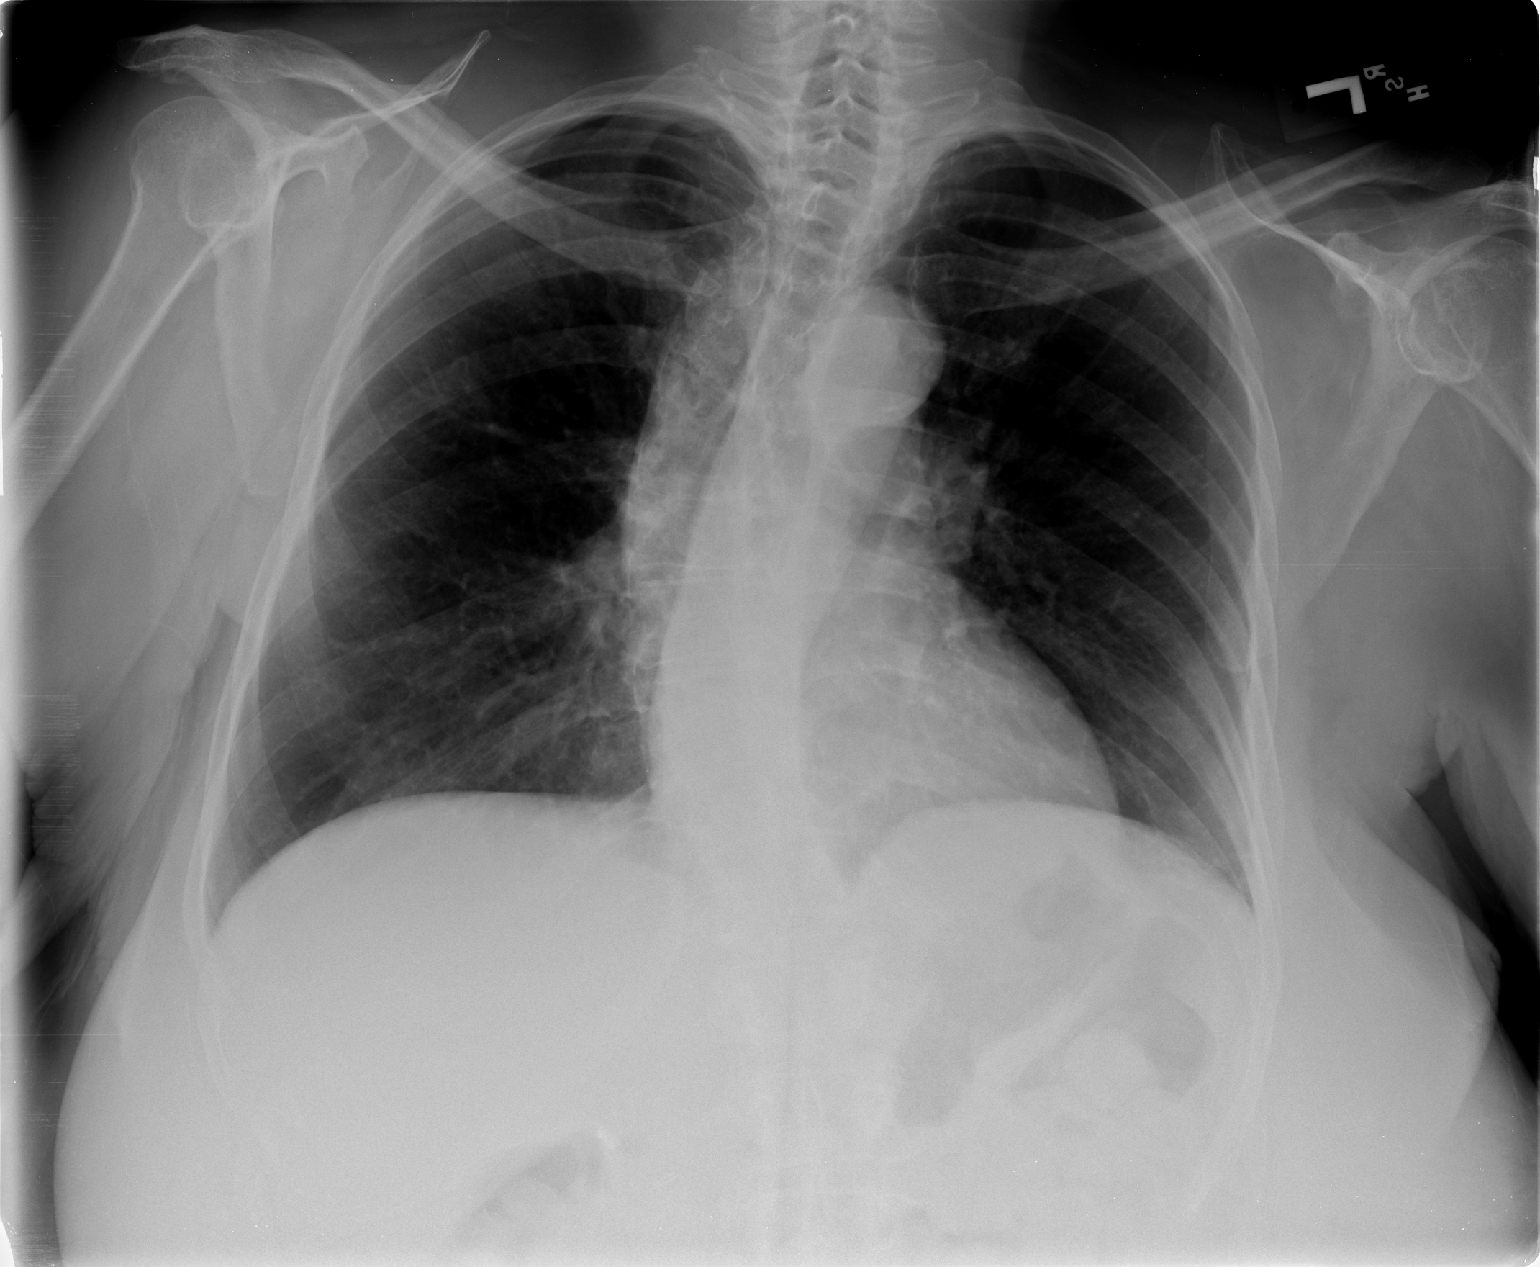

[view not recorded (2 of 2)]
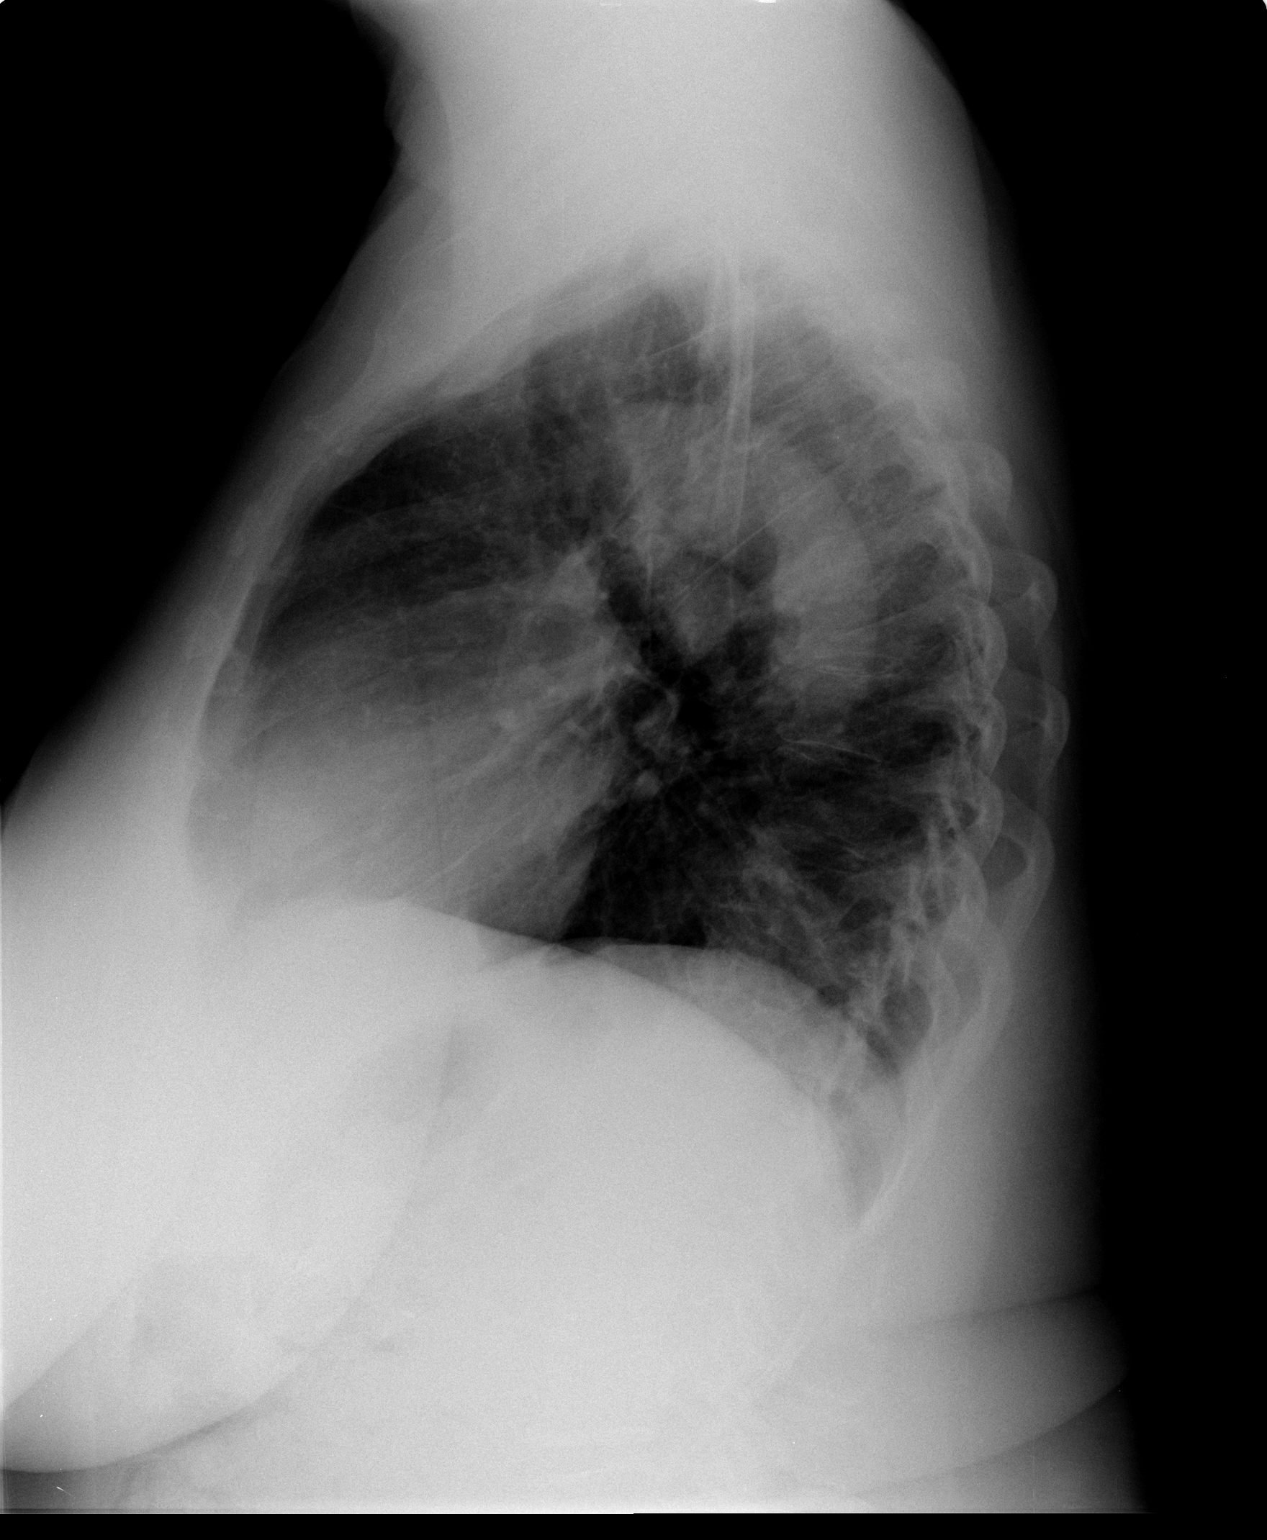

[2 of 2 positions shown; findings below may reference images not displayed]

FINDINGS: Heart size and mediastinal contours are normal.

No pleural effusion or pulmonary edema noted.

No airspace consolidation identified.

Scoliosis deformity is noted involving the thoracic spine which is
convex to the right.
IMPRESSION: 1.  No active cardiopulmonary abnormalities noted.

## 2014-02-19 DIAGNOSIS — L602 Onychogryphosis: Secondary | ICD-10-CM | POA: Diagnosis not present

## 2014-02-19 DIAGNOSIS — L84 Corns and callosities: Secondary | ICD-10-CM | POA: Diagnosis not present

## 2014-02-19 DIAGNOSIS — M19079 Primary osteoarthritis, unspecified ankle and foot: Secondary | ICD-10-CM | POA: Diagnosis not present

## 2014-02-20 ENCOUNTER — Ambulatory Visit (INDEPENDENT_AMBULATORY_CARE_PROVIDER_SITE_OTHER): Payer: Medicare Other | Admitting: Internal Medicine

## 2014-02-20 ENCOUNTER — Encounter: Payer: Self-pay | Admitting: Internal Medicine

## 2014-02-20 VITALS — BP 165/74 | HR 71 | Temp 97.6°F | Resp 16 | Ht 63.0 in | Wt 225.0 lb

## 2014-02-20 DIAGNOSIS — I10 Essential (primary) hypertension: Secondary | ICD-10-CM

## 2014-02-20 DIAGNOSIS — E669 Obesity, unspecified: Secondary | ICD-10-CM

## 2014-02-20 DIAGNOSIS — M8580 Other specified disorders of bone density and structure, unspecified site: Secondary | ICD-10-CM

## 2014-02-20 DIAGNOSIS — R739 Hyperglycemia, unspecified: Secondary | ICD-10-CM | POA: Diagnosis not present

## 2014-02-20 DIAGNOSIS — E876 Hypokalemia: Secondary | ICD-10-CM | POA: Diagnosis not present

## 2014-02-20 DIAGNOSIS — E785 Hyperlipidemia, unspecified: Secondary | ICD-10-CM | POA: Diagnosis not present

## 2014-02-20 DIAGNOSIS — E559 Vitamin D deficiency, unspecified: Secondary | ICD-10-CM

## 2014-02-20 MED ORDER — BENAZEPRIL HCL 10 MG PO TABS: 10.0000 mg | ORAL_TABLET | Freq: Every day | ORAL | Status: DC

## 2014-02-20 NOTE — Progress Notes (Signed)
Patient ID: Kara Diaz, female   DOB: March 27, 1940, 74 y.o.   MRN: [id number]  HPI 74 y.o. female  presents for 3 month follow up with hypertension, hyperlipidemia, prediabetes and vitamin D. Her blood pressure has not been controlled at home, today their BP is BP: (!) 165/74 mmHg She does not workout. She denies chest pain, shortness of breath, dizziness.  She is on cholesterol medication and denies myalgias. Her cholesterol is at goal. The cholesterol last visit was:   Lab Results  Component Value Date   CHOL 204* 08/29/2013   HDL 90 08/29/2013   LDLCALC 97 08/29/2013   TRIG 84 08/29/2013   CHOLHDL 2.3 08/29/2013   She has been working on diet and exercise for prediabetes, and denies none. Last A1C in the office was:  Lab Results  Component Value Date   HGBA1C 5.2 05/08/2013   Patient is on Vitamin D supplement.   Lab Results  Component Value Date   VD25OH 56 04/30/2013       Current Medications:  Current Outpatient Prescriptions on File Prior to Visit  Medication Sig Dispense Refill  . amLODipine-benazepril (LOTREL) 10-20 MG per capsule Take 1 capsule by mouth daily. 90 capsule 2  . aspirin EC 81 MG tablet Take 81 mg by mouth daily.    Marland Kitchen atorvastatin (LIPITOR) 40 MG tablet Take 1 tablet (40 mg total) by mouth daily. 90 tablet 3  . Calcium 600-200 MG-UNIT per tablet Take 1 tablet by mouth daily.    . Cholecalciferol (VITAMIN D3) 2000 UNITS TABS Take 1 tablet by mouth daily.    . cycloSPORINE (RESTASIS) 0.05 % ophthalmic emulsion Place 1 drop into both eyes 2 (two) times daily.    Marland Kitchen exemestane (AROMASIN) 25 MG tablet Take 1 tablet (25 mg total) by mouth daily after breakfast. 90 tablet 12  . Magnesium 400 MG CAPS Take 1 tablet by mouth daily.    . Multiple Vitamins-Minerals (MULTIVITAMINS THER. W/MINERALS) TABS Take 1 tablet by mouth daily.    . potassium chloride SA (K-DUR,KLOR-CON) 20 MEQ tablet Take 1 tablet (20 mEq total) by mouth daily. 90 tablet 2  .  triamterene-hydrochlorothiazide (DYAZIDE) 37.5-25 MG per capsule Take 1 each (1 capsule total) by mouth every morning. 90 capsule 2  . vitamin C (ASCORBIC ACID) 500 MG tablet Take 500 mg by mouth daily.     No current facility-administered medications on file prior to visit.   Medical History:  Past Medical History  Diagnosis Date  . Angina   . PONV (postoperative nausea and vomiting)   . Hypertension     takes Dyazide and Lotrel daily  . Hyperlipidemia     doesn't take any meds for this  . Pneumonia     hx of in 1992  . Joint pain   . Joint swelling   . Arthritis     all over  . Hemorrhoids   . Urinary frequency   . Early cataracts, bilateral   . Insomnia     but doesn't take anything for this  . 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 History of radiation therapy 06/07/11-07/11/11    left breast 50Gy/18fx   Allergies: No Known Allergies   Review of Systems:  Review of Systems  Constitutional: Negative.   HENT: Negative.   Eyes: Negative.   Respiratory: Negative.   Cardiovascular: Negative.   Gastrointestinal: Negative.   Genitourinary: Negative.   Musculoskeletal: Negative.   Skin:  Negative.   Neurological: Negative.   Endo/Heme/Allergies: Negative.   Psychiatric/Behavioral: Negative.      Family history- Review and unchanged Social history- Review and unchanged Physical Exam: BP 165/74 mmHg  Pulse 71  Temp(Src) 97.6 F (36.4 C) (Oral)  Resp 16  Ht 5\' 3"  (1.6 m)  Wt 225 lb (102.059 kg)  BMI 39.87 kg/m2 Wt Readings from Last 3 Encounters:  02/20/14 225 lb (102.059 kg)  06/06/13 208 lb (94.348 kg)  04/26/13 208 lb 3.2 oz (94.439 kg)   General Appearance: Obese. Well nourished, in no apparent distress. Eyes: PERRLA, EOMs, conjunctiva no swelling or erythema Sinuses: No Frontal/maxillary tenderness Neck: Supple, thyroid normal.  Respiratory: Respiratory effort normal, BS equal bilaterally without rales, rhonchi,  wheezing or stridor.  Cardio: RRR with no MRGs. Brisk peripheral pulses without edema.  Abdomen:  Obese, soft, + BS.  Non tender, no guarding, rebound, hernias, masses. Lymphatics: Non tender without lymphadenopathy.  Musculoskeletal: Full ROM, normal strength, normal gait.  Skin: Warm, dry without rashes, lesions, ecchymosis.  Neuro: Cranial nerves intact. Normal muscle tone, no cerebellar symptoms. Sensation intact.  Psych: Awake and oriented X 3, normal affect, Insight and Judgment appropriate.   Assessment and Plan:  1. Essential hypertension, benign - BP poorly controlled. Patient has been resistant to increasing medications, and she missed her last visit. Today she is agreeable to increasing Benazepril so I will add Benazepril 10 mg daily. She will come in for a BP check in 1 month and at that time we will check her Renal function and potassium in light of her increase in ACE-I. - benazepril (LOTENSIN) 10 MG tablet; Take 1 tablet (10 mg total) by mouth daily.  Dispense: 90 tablet; Refill: 3 - Comprehensive metabolic panel; Future - Urinalysis; Future - Continue medication, monitor blood pressure at home. Continue DASH diet.  Reminder to go to the ER if any CP, SOB, nausea, dizziness, severe HA, changes vision/speech, left arm numbness and tingling, and jaw pain.   2. Hyperlipidemia with target LDL less than 100 - Cholesterol was much improved at last reading. Will continue curerrnt medications and re-check lipids at next visit. - Comprehensive metabolic panel; Future - Lipid Panel; Future - Continue diet and exercise. Check cholesterol.    3. Moderate obesity - Will discuss at next visit.  4. Hypokalemia - Pt has had persistent hypokalemia and is on replacement. Will re-assess dose after labs reviewed. - benazepril (LOTENSIN) 10 MG tablet; Take 1 tablet (10 mg total) by mouth daily.  Dispense: 90 tablet; Refill: 3 - Comprehensive metabolic panel; Future  5. Vitamin D  deficiency - Pt reports that she has not been compliant with vitamin D. There is much controversy of the benefit of vitamin D in the elderly population and the risk of fall. Will check levels before next visit. - Vitamin D Def- check level and continue medications.  6. Hyperglycemia/Metabolic Syndrome/Pre-diabetes - Hemoglobin A1c; Future -Continue diet and exercise. Check A1C   Continue diet and meds as discussed. Further disposition pending results of labs.  MATTHEWS,MICHELLE A., MD 11:04 AM [facility]

## 2014-02-26 ENCOUNTER — Telehealth: Payer: Self-pay | Admitting: Internal Medicine

## 2014-02-26 NOTE — Telephone Encounter (Signed)
Nothing in last office or referrals tab regarding referral to neurology. I have sent a Staff message to provider regarding this. Thanks!

## 2014-02-26 NOTE — Telephone Encounter (Signed)
Patient called regarding a referral to neurologist, Dr. Posey Pronto.

## 2014-02-27 NOTE — Telephone Encounter (Signed)
Patient was not to be referred to neurologist per Dr.

## 2014-03-20 DIAGNOSIS — Z471 Aftercare following joint replacement surgery: Secondary | ICD-10-CM | POA: Diagnosis not present

## 2014-03-20 DIAGNOSIS — Z96653 Presence of artificial knee joint, bilateral: Secondary | ICD-10-CM | POA: Diagnosis not present

## 2014-03-20 DIAGNOSIS — Z96651 Presence of right artificial knee joint: Secondary | ICD-10-CM | POA: Diagnosis not present

## 2014-03-20 DIAGNOSIS — Z87891 Personal history of nicotine dependence: Secondary | ICD-10-CM | POA: Diagnosis not present

## 2014-03-20 DIAGNOSIS — Z96652 Presence of left artificial knee joint: Secondary | ICD-10-CM | POA: Diagnosis not present

## 2014-04-03 ENCOUNTER — Other Ambulatory Visit: Payer: Self-pay | Admitting: Hematology and Oncology

## 2014-04-03 DIAGNOSIS — Z853 Personal history of malignant neoplasm of breast: Secondary | ICD-10-CM

## 2014-04-09 ENCOUNTER — Ambulatory Visit: Payer: BC Managed Care – PPO | Admitting: Oncology

## 2014-04-09 ENCOUNTER — Other Ambulatory Visit: Payer: BC Managed Care – PPO

## 2014-04-17 ENCOUNTER — Other Ambulatory Visit: Payer: Self-pay | Admitting: *Deleted

## 2014-04-17 DIAGNOSIS — D0510 Intraductal carcinoma in situ of unspecified breast: Secondary | ICD-10-CM

## 2014-04-18 ENCOUNTER — Ambulatory Visit (HOSPITAL_BASED_OUTPATIENT_CLINIC_OR_DEPARTMENT_OTHER): Payer: Medicare Other | Admitting: Hematology and Oncology

## 2014-04-18 ENCOUNTER — Telehealth: Payer: Self-pay | Admitting: Hematology and Oncology

## 2014-04-18 ENCOUNTER — Other Ambulatory Visit (HOSPITAL_BASED_OUTPATIENT_CLINIC_OR_DEPARTMENT_OTHER): Payer: Medicare Other

## 2014-04-18 VITALS — BP 143/67 | HR 66 | Temp 97.7°F | Resp 20 | Ht 63.0 in | Wt 222.5 lb

## 2014-04-18 DIAGNOSIS — Z853 Personal history of malignant neoplasm of breast: Secondary | ICD-10-CM

## 2014-04-18 DIAGNOSIS — D0510 Intraductal carcinoma in situ of unspecified breast: Secondary | ICD-10-CM

## 2014-04-18 DIAGNOSIS — Z5189 Encounter for other specified aftercare: Secondary | ICD-10-CM | POA: Diagnosis not present

## 2014-04-18 DIAGNOSIS — D0512 Intraductal carcinoma in situ of left breast: Secondary | ICD-10-CM

## 2014-04-18 LAB — CBC WITH DIFFERENTIAL/PLATELET
BASO%: 0.6 % (ref 0.0–2.0)
Basophils Absolute: 0 10*3/uL (ref 0.0–0.1)
EOS%: 2.1 % (ref 0.0–7.0)
Eosinophils Absolute: 0.1 10*3/uL (ref 0.0–0.5)
HEMATOCRIT: 39.9 % (ref 34.8–46.6)
HGB: 12.8 g/dL (ref 11.6–15.9)
LYMPH%: 30.8 % (ref 14.0–49.7)
MCH: 30.4 pg (ref 25.1–34.0)
MCHC: 32.1 g/dL (ref 31.5–36.0)
MCV: 94.8 fL (ref 79.5–101.0)
MONO#: 0.3 10*3/uL (ref 0.1–0.9)
MONO%: 8.7 % (ref 0.0–14.0)
NEUT#: 2.2 10*3/uL (ref 1.5–6.5)
NEUT%: 57.8 % (ref 38.4–76.8)
Platelets: 308 10*3/uL (ref 145–400)
RBC: 4.2 10*6/uL (ref 3.70–5.45)
RDW: 12.6 % (ref 11.2–14.5)
WBC: 3.9 10*3/uL (ref 3.9–10.3)
lymph#: 1.2 10*3/uL (ref 0.9–3.3)

## 2014-04-18 LAB — COMPREHENSIVE METABOLIC PANEL (CC13)
ALBUMIN: 4.2 g/dL (ref 3.5–5.0)
ALT: 12 U/L (ref 0–55)
AST: 17 U/L (ref 5–34)
Alkaline Phosphatase: 115 U/L (ref 40–150)
Anion Gap: 11 mEq/L (ref 3–11)
BUN: 16.4 mg/dL (ref 7.0–26.0)
CALCIUM: 10.4 mg/dL (ref 8.4–10.4)
CHLORIDE: 104 meq/L (ref 98–109)
CO2: 30 mEq/L — ABNORMAL HIGH (ref 22–29)
Creatinine: 0.8 mg/dL (ref 0.6–1.1)
EGFR: 82 mL/min/{1.73_m2} — ABNORMAL LOW (ref >90)
GLUCOSE: 78 mg/dL (ref 70–140)
Potassium: 3.2 mEq/L — ABNORMAL LOW (ref 3.5–5.1)
Sodium: 145 mEq/L (ref 136–145)
TOTAL PROTEIN: 7.7 g/dL (ref 6.4–8.3)
Total Bilirubin: 1.19 mg/dL (ref 0.20–1.20)

## 2014-04-18 MED ORDER — ANASTROZOLE 1 MG PO TABS: 1.0000 mg | ORAL_TABLET | Freq: Every day | ORAL | Status: DC

## 2014-04-18 NOTE — Telephone Encounter (Signed)
Appointments made and avs printed for patient °

## 2014-04-18 NOTE — Assessment & Plan Note (Signed)
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 Few myalgias could be related to underlying arthritis 2. Denies any vaginal bleeding  Breast cancer surveillance: 1. Breast exam 04/18/2014 is normal 2. Mammograms will be done later this month. 3. Bone density 05/03/2013 showed a T score -1.4 mild osteopenia  Survivorship:Discussed the importance of physical exercise in decreasing the likelihood of breast cancer recurrence. Recommended 30 mins daily 6 days a week of either brisk walking or cycling or swimming. Encouraged patient to eat more fruits and vegetables and decrease red meat.   Return to clinic in 1 year for follow-up

## 2014-04-18 NOTE — Progress Notes (Signed)
Patient Care Team: Leana Gamer, MD as PCP - General (Internal Medicine)  DIAGNOSIS: Ductal carcinoma in situ (DCIS) of left breast   Staging form: Breast, AJCC 7th Edition     Clinical: Stage 0 (Tis, N0, cM0) - Unsigned       Staging comments: Staged in Breast Cancer Conference 3.27.13      Pathologic: No stage assigned - Unsigned   PRIOR THERAPY:  #1 patient has undergone a Left breast lumpectomy. The final pathology revealed high-grade DCIS measuring 0.5 cm. The tumor was ER +80% PR +70%.  #2 patient will complete radiation therapy next week on 07/11/2011.  #3 patient will begin antiestrogen therapy with Aromasin 25 mg daily as a chemoprevention for ipsilateral and contralateral breasts On 07/14/2011.. A total of 5 years of therapy is planned.  CURRENT THERAPY: Aromasin 25 mg daily started on 07/14/2011. Changed to anastrozole 1 mg daily from 04/18/2014 because of cost of therapy  INTERVAL HISTORY: Kara Diaz is a 74 year old with above-mentioned history of left breast cancer treated with lumpectomy radiation and currently on hormonal therapy. She has been tolerating it fairly well without any major problems or concerns. She reports that the Aromasin costs her about $500 every 3 months. She denies any hot flashes or myalgias. She has an appointment for a mammogram for later this month. Denies any lumps or nodules in the breasts.  REVIEW OF SYSTEMS:   Constitutional: Denies fevers, chills or abnormal weight loss Eyes: Denies blurriness of vision Ears, nose, mouth, throat, and face: Denies mucositis or sore throat Respiratory: Denies cough, dyspnea or wheezes Cardiovascular: Denies palpitation, chest discomfort or lower extremity swelling Gastrointestinal:  Denies nausea, heartburn or change in bowel habits Skin: Denies abnormal skin rashes Lymphatics: Denies new lymphadenopathy or easy bruising Neurological:Denies numbness, tingling or new weaknesses Behavioral/Psych:  Mood is stable, no new changes  Breast:  denies any pain or lumps or nodules in either breasts All other systems were reviewed with the patient and are negative.  I have reviewed the past medical history, past surgical history, social history and family history with the patient and they are unchanged from previous note.  ALLERGIES:  has No Known Allergies.  MEDICATIONS:  Current Outpatient Prescriptions  Medication Sig Dispense Refill  . amLODipine-benazepril (LOTREL) 10-20 MG per capsule Take 1 capsule by mouth daily. (Patient taking differently: Take 2 capsules by mouth daily. ) 90 capsule 2  . aspirin EC 81 MG tablet Take 81 mg by mouth daily.    Marland Kitchen atorvastatin (LIPITOR) 40 MG tablet Take 1 tablet (40 mg total) by mouth daily. 90 tablet 3  . benazepril (LOTENSIN) 10 MG tablet Take 1 tablet (10 mg total) by mouth daily. 90 tablet 3  . Calcium 600-200 MG-UNIT per tablet Take 1 tablet by mouth daily.    . Cholecalciferol (VITAMIN D3) 2000 UNITS TABS Take 1 tablet by mouth daily.    . cycloSPORINE (RESTASIS) 0.05 % ophthalmic emulsion Place 1 drop into both eyes 2 (two) times daily.    . Magnesium 400 MG CAPS Take 1 tablet by mouth daily.    . Multiple Vitamins-Minerals (MULTIVITAMINS THER. W/MINERALS) TABS Take 1 tablet by mouth daily.    . potassium chloride SA (K-DUR,KLOR-CON) 20 MEQ tablet Take 1 tablet (20 mEq total) by mouth daily. 90 tablet 2  . triamterene-hydrochlorothiazide (DYAZIDE) 37.5-25 MG per capsule Take 1 each (1 capsule total) by mouth every morning. 90 capsule 2  . vitamin C (ASCORBIC ACID) 500 MG tablet Take  500 mg by mouth daily.    Marland Kitchen anastrozole (ARIMIDEX) 1 MG tablet Take 1 tablet (1 mg total) by mouth daily. 90 tablet 3   No current facility-administered medications for this visit.    PHYSICAL EXAMINATION: ECOG PERFORMANCE STATUS: 1 - Symptomatic but completely ambulatory  Filed Vitals:   04/18/14 1131  BP: 143/67  Pulse: 66  Temp: 97.7 F (36.5 C)  Resp:  20   Filed Weights   04/18/14 1131  Weight: 222 lb 8 oz (100.925 kg)    GENERAL:alert, no distress and comfortable SKIN: skin color, texture, turgor are normal, no rashes or significant lesions EYES: normal, Conjunctiva are pink and non-injected, sclera clear OROPHARYNX:no exudate, no erythema and lips, buccal mucosa, and tongue normal  NECK: supple, thyroid normal size, non-tender, without nodularity LYMPH:  no palpable lymphadenopathy in the cervical, axillary or inguinal LUNGS: clear to auscultation and percussion with normal breathing effort HEART: regular rate & rhythm and no murmurs and no lower extremity edema ABDOMEN:abdomen soft, non-tender and normal bowel sounds Musculoskeletal:no cyanosis of digits and no clubbing  NEURO: alert & oriented x 3 with fluent speech, no focal motor/sensory deficits BREAST: No palpable masses or nodules in either right or left breasts. No palpable axillary supraclavicular or infraclavicular adenopathy no breast tenderness or nipple discharge. (exam performed in the presence of a chaperone)  LABORATORY DATA:  I have reviewed the data as listed   Chemistry      Component Value Date/Time   NA 145 04/18/2014 1058   NA 144 11/29/2013 1214   K 3.2* 04/18/2014 1058   K 3.4* 11/29/2013 1214   CL 103 11/29/2013 1214   CL 106 04/06/2012 1128   CO2 30* 04/18/2014 1058   CO2 30 11/29/2013 1214   BUN 16.4 04/18/2014 1058   BUN 14 11/29/2013 1214   CREATININE 0.8 04/18/2014 1058   CREATININE 0.69 11/29/2013 1214   CREATININE 0.55 05/13/2011 1357      Component Value Date/Time   CALCIUM 10.4 04/18/2014 1058   CALCIUM 10.9* 11/29/2013 1214   ALKPHOS 115 04/18/2014 1058   ALKPHOS 102 11/29/2013 1214   AST 17 04/18/2014 1058   AST 19 11/29/2013 1214   ALT 12 04/18/2014 1058   ALT 10 11/29/2013 1214   BILITOT 1.19 04/18/2014 1058   BILITOT 1.0 11/29/2013 1214       Lab Results  Component Value Date   WBC 3.9 04/18/2014   HGB 12.8  04/18/2014   HCT 39.9 04/18/2014   MCV 94.8 04/18/2014   PLT 308 04/18/2014   NEUTROABS 2.2 04/18/2014   ASSESSMENT & PLAN:  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 Few myalgias could be related to underlying arthritis 2. Denies any vaginal bleeding  Breast cancer surveillance: 1. Breast exam 04/18/2014 is normal 2. Mammograms will be done later this month. 3. Bone density 05/03/2013 showed a T score -1.4 mild osteopenia  Survivorship:Discussed the importance of physical exercise in decreasing the likelihood of breast cancer recurrence. Recommended 30 mins daily 6 days a week of either brisk walking or cycling or swimming. Encouraged patient to eat more fruits and vegetables and decrease red meat.   Return to clinic in 1 year  for follow-up   No orders of the defined types were placed in this encounter.   The patient has a good understanding of the overall plan. she agrees with it. She will call with any problems that may develop before her next visit here.   Rulon Eisenmenger, MD

## 2014-04-21 ENCOUNTER — Other Ambulatory Visit: Payer: Self-pay | Admitting: Internal Medicine

## 2014-04-21 DIAGNOSIS — I10 Essential (primary) hypertension: Secondary | ICD-10-CM

## 2014-04-21 MED ORDER — TRIAMTERENE-HCTZ 37.5-25 MG PO CAPS: 1.0000 | ORAL_CAPSULE | ORAL | Status: DC

## 2014-04-21 NOTE — Telephone Encounter (Signed)
Refilled diazide sent to pharmacy. Thanks!

## 2014-04-23 DIAGNOSIS — H2513 Age-related nuclear cataract, bilateral: Secondary | ICD-10-CM | POA: Diagnosis not present

## 2014-04-23 DIAGNOSIS — H04123 Dry eye syndrome of bilateral lacrimal glands: Secondary | ICD-10-CM | POA: Diagnosis not present

## 2014-05-01 ENCOUNTER — Other Ambulatory Visit: Payer: Self-pay | Admitting: Surgery

## 2014-05-01 DIAGNOSIS — Z853 Personal history of malignant neoplasm of breast: Secondary | ICD-10-CM

## 2014-05-02 ENCOUNTER — Ambulatory Visit
Admission: RE | Admit: 2014-05-02 | Discharge: 2014-05-02 | Disposition: A | Discharge status: Home / Self Care | Payer: Medicare Other | Source: Ambulatory Visit | Attending: Hematology and Oncology | Admitting: Hematology and Oncology

## 2014-05-02 DIAGNOSIS — Z853 Personal history of malignant neoplasm of breast: Secondary | ICD-10-CM | POA: Diagnosis not present

## 2014-05-02 DIAGNOSIS — R928 Other abnormal and inconclusive findings on diagnostic imaging of breast: Secondary | ICD-10-CM | POA: Diagnosis not present

## 2014-05-15 ENCOUNTER — Other Ambulatory Visit: Payer: Self-pay | Admitting: Internal Medicine

## 2014-05-15 DIAGNOSIS — E785 Hyperlipidemia, unspecified: Secondary | ICD-10-CM

## 2014-05-15 MED ORDER — ATORVASTATIN CALCIUM 40 MG PO TABS: 40.0000 mg | ORAL_TABLET | Freq: Every day | ORAL | Status: DC

## 2014-05-15 NOTE — Telephone Encounter (Signed)
Patient would like 3 refills

## 2014-05-20 DIAGNOSIS — L602 Onychogryphosis: Secondary | ICD-10-CM | POA: Diagnosis not present

## 2014-05-20 DIAGNOSIS — L84 Corns and callosities: Secondary | ICD-10-CM | POA: Diagnosis not present

## 2014-05-27 ENCOUNTER — Telehealth: Payer: Self-pay

## 2014-05-27 NOTE — Telephone Encounter (Signed)
Patient called complaining of "sciatica pain"  In her back,she  states she has tried taking ibuprofen and  Tylenol otc and nothing has helped. I offered patient an appointment to come in on Wednesday 05/28/14, patient denied appointment states she has an appointment for Thursday 05/29/2014. She was asking if it was ok to take her brothers medication until she could come in on Thursday. I advised her that she should not do that, it is against the law and if she can't come in sooner then she needs to go to urgent care or the er. Patient verbalized understanding.

## 2014-05-29 ENCOUNTER — Encounter: Payer: Self-pay | Admitting: Internal Medicine

## 2014-05-29 ENCOUNTER — Ambulatory Visit (HOSPITAL_COMMUNITY)
Admission: RE | Admit: 2014-05-29 | Discharge: 2014-05-29 | Disposition: A | Discharge status: Home / Self Care | Payer: Medicare Other | Source: Ambulatory Visit | Attending: Internal Medicine | Admitting: Internal Medicine

## 2014-05-29 ENCOUNTER — Ambulatory Visit (INDEPENDENT_AMBULATORY_CARE_PROVIDER_SITE_OTHER): Payer: Medicare Other | Admitting: Internal Medicine

## 2014-05-29 VITALS — BP 138/57 | HR 77 | Temp 98.1°F | Resp 16 | Ht 63.0 in | Wt 218.0 lb

## 2014-05-29 DIAGNOSIS — G5702 Lesion of sciatic nerve, left lower limb: Secondary | ICD-10-CM

## 2014-05-29 DIAGNOSIS — M5136 Other intervertebral disc degeneration, lumbar region: Secondary | ICD-10-CM | POA: Diagnosis not present

## 2014-05-29 DIAGNOSIS — R52 Pain, unspecified: Secondary | ICD-10-CM | POA: Insufficient documentation

## 2014-05-29 DIAGNOSIS — M1612 Unilateral primary osteoarthritis, left hip: Secondary | ICD-10-CM | POA: Diagnosis not present

## 2014-05-29 DIAGNOSIS — M25552 Pain in left hip: Secondary | ICD-10-CM

## 2014-05-29 DIAGNOSIS — W19XXXD Unspecified fall, subsequent encounter: Secondary | ICD-10-CM | POA: Insufficient documentation

## 2014-05-29 MED ORDER — KETOROLAC TROMETHAMINE 60 MG/2ML IM SOLN
60.0000 mg | Freq: Once | INTRAMUSCULAR | Status: AC
  Administered 2014-05-29: 60 mg via INTRAMUSCULAR

## 2014-05-29 NOTE — Progress Notes (Signed)
Subjective:     Patient ID: Kara Diaz, female   DOB: [birth year], 74 y.o.   MRN: [id number]  HPI: Pt presents today with C/O of papin in the left hip region. She describes the pain as a cramp and states that it sometimes becomes sharp in nature. Iyt remains in the buttocks region and is non radiating.  She cannot rate the intensity but it is intense enough that she has to use her cane to ambulate, The pain seems to become worse with certain movements either in laying, sitting or standing, but she is unable to identify palliative features. She does no recall any preceding trauma.    Review of Systems  Constitutional: Negative.  Negative for fever, chills, activity change, appetite change and fatigue.  HENT: Negative.  Negative for dental problem, ear pain, hearing loss, sore throat and trouble swallowing.   Eyes: Negative.  Negative for visual disturbance.  Respiratory: Negative.  Negative for cough, chest tightness, shortness of breath and wheezing.   Cardiovascular: Negative.  Negative for chest pain, palpitations and leg swelling.  Gastrointestinal: Negative.  Negative for abdominal pain, diarrhea, constipation, blood in stool, anal bleeding and rectal pain.  Endocrine: Negative.  Negative for cold intolerance, heat intolerance, polydipsia, polyphagia and polyuria.  Genitourinary: Negative.  Negative for dysuria, urgency, frequency, vaginal discharge, genital sores, vaginal pain, menstrual problem and dyspareunia.  Skin: Negative.   Allergic/Immunologic: Negative.  Negative for environmental allergies.  Neurological: Negative.  Negative for dizziness, tremors, weakness and headaches.  Hematological: Negative.   Psychiatric/Behavioral: Negative.   All other systems reviewed and are negative.      Objective:   Physical Exam  Constitutional: She is oriented to person, place, and time. She appears well-developed and well-nourished.  HENT:  Head: Normocephalic and atraumatic.  Eyes:  Conjunctivae and EOM are normal. No scleral icterus.  Neck: Neck supple. No JVD present. No thyromegaly present.  Cardiovascular: Normal rate and regular rhythm.  Exam reveals no gallop and no friction rub.   No murmur heard. Pulmonary/Chest: Effort normal and breath sounds normal. She has no wheezes. She has no rales.  Abdominal: Bowel sounds are normal. She exhibits no distension and no mass. There is no tenderness.  Musculoskeletal:  Tenderness noted over left piriformis region. She has relief of pain with piriformis stretch and increase in pain and cramping with positions of piriformis contraction.  Neurological: She is alert and oriented to person, place, and time. No cranial nerve deficit.  Vitals reviewed. BP 138/57 mmHg  Pulse 77  Temp(Src) 98.1 F (36.7 C) (Oral)  Resp 16  Ht 5\' 3"  (1.6 m)  Wt 218 lb (98.884 kg)  BMI 38.63 kg/m2      Assessment:     1. Left hip pain - I suspect piriformis syndrome. However in light of her medical history, I will obtain an x-ray of hio to ensure no pathological fractures.  - ketorolac (TORADOL) injection 60 mg; Inject 2 mLs (60 mg total) into the muscle once. - DG HIP UNILAT WITH PELVIS 1V LEFT; Future  2. Piriformis syndrome of left side - I suspect that she has piriformis syndrome. I have offered Physical Therapy but patient refused. I have given her copies of stretching exercises for piriformis stretchin - ketorolac (TORADOL) injection 60 mg; Inject 2 mLs (60 mg total) into the muscle once.      Plan:     -See above.

## 2014-06-30 ENCOUNTER — Other Ambulatory Visit: Payer: Self-pay

## 2014-06-30 DIAGNOSIS — I10 Essential (primary) hypertension: Secondary | ICD-10-CM

## 2014-06-30 MED ORDER — AMLODIPINE BESY-BENAZEPRIL HCL 10-20 MG PO CAPS
1.0000 | ORAL_CAPSULE | Freq: Every day | ORAL | Status: DC
Start: 2014-06-30 — End: 2015-02-12

## 2014-07-10 ENCOUNTER — Encounter: Payer: Self-pay | Admitting: General Practice

## 2014-07-10 NOTE — Progress Notes (Signed)
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  Provided pastoral presence, empathic listening, normalization of feelings, and assistance with framing reflection on caregiving challenges and layers of grief associated with changes in personal/relational/family roles.  She verbalized gratitude and is aware of ongoing [facility] team availability.  Also mailing a handwritten note of encouragement.  Please page as further needs arise.  Thank you.  Suzette Battiest, [place] Pager [phone] Voicemail [phone]

## 2014-08-01 ENCOUNTER — Encounter: Payer: Self-pay | Admitting: Family Medicine

## 2014-08-01 ENCOUNTER — Ambulatory Visit (INDEPENDENT_AMBULATORY_CARE_PROVIDER_SITE_OTHER): Payer: Medicare Other | Admitting: Family Medicine

## 2014-08-01 VITALS — BP 153/68 | HR 77 | Temp 98.5°F | Resp 16 | Ht 64.0 in | Wt 223.0 lb

## 2014-08-01 DIAGNOSIS — E876 Hypokalemia: Secondary | ICD-10-CM

## 2014-08-01 MED ORDER — POTASSIUM CHLORIDE CRYS ER 20 MEQ PO TBCR: 20.0000 meq | EXTENDED_RELEASE_TABLET | Freq: Every day | ORAL | Status: DC

## 2014-08-01 NOTE — Patient Instructions (Addendum)
Continue chronic medications as they are Try to eat a healthy diet.Before is a DASH diet which is good for high blood pressure and a good over all diet Try to walk some most days Wear seat belts, keep smoke detectors and fire extinguishers in working condition Avoid situations that might cause you to fall. If you feel depressed, talk to [place] or someone.  Check with your insurance to see if they cover shingles and pneumonia shots.

## 2014-08-01 NOTE — Progress Notes (Signed)
Patient ID: Kara Diaz, female   DOB: December 21, 1940, 74 y.o.   MRN: [id number]   Kara Diaz, is a 74 y.o. female  [id number]  [id number]  DOB - Jul 05, 1940  CC:  Chief Complaint  Patient presents with  . Annual Exam       HPI: Kara Diaz is a 74 y.o. female here for annual wellness exam. She has history of Angina, hypertension, hyperlipidemia, arthritis, early cataracts, breast CA,insomnia. Her medications include Amlodipine, atorvastatin, ASA, Lotensin, K. Dosages can be seen in medication list. Her health maintenance items are up to date except for Prevnar 13 and Zostavax. She does not exercise regularly, Tries to follow a healthy diet and expresses good understanding of a healthy lifestyle. She is under moderate stress taking care of a brother with alzheimers. She needs one refill today.  No Known Allergies Past Medical History  Diagnosis Date  . Angina   . PONV (postoperative nausea and vomiting)   . Hypertension     takes Dyazide and Lotrel daily  . Hyperlipidemia     doesn't take any meds for this  . Pneumonia     hx of in 1992  . Joint pain   . Joint swelling   . Arthritis     all over  . Hemorrhoids   . Urinary frequency   . Early cataracts, bilateral   . Insomnia     but doesn't take anything for this  . 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 History of radiation therapy 06/07/11-07/11/11    left breast 50Gy/59fx   Current Outpatient Prescriptions on File Prior to Visit  Medication Sig Dispense Refill  . amLODipine-benazepril (LOTREL) 10-20 MG per capsule Take 1 capsule by mouth daily. 90 capsule 2  . anastrozole (ARIMIDEX) 1 MG tablet Take 1 tablet (1 mg total) by mouth daily. 90 tablet 3  . aspirin EC 81 MG tablet Take 81 mg by mouth daily.    Marland Kitchen atorvastatin (LIPITOR) 40 MG tablet Take 1 tablet (40 mg total) by mouth daily. 90 tablet 3  . benazepril (LOTENSIN) 10 MG tablet Take 1 tablet (10 mg total) by  mouth daily. 90 tablet 3  . Calcium 600-200 MG-UNIT per tablet Take 1 tablet by mouth daily.    . Cholecalciferol (VITAMIN D3) 2000 UNITS TABS Take 1 tablet by mouth daily.    . cycloSPORINE (RESTASIS) 0.05 % ophthalmic emulsion Place 1 drop into both eyes 2 (two) times daily.    . Magnesium 400 MG CAPS Take 1 tablet by mouth daily.    . Multiple Vitamins-Minerals (MULTIVITAMINS THER. W/MINERALS) TABS Take 1 tablet by mouth daily.    . potassium chloride SA (K-DUR,KLOR-CON) 20 MEQ tablet Take 1 tablet (20 mEq total) by mouth daily. 90 tablet 2  . triamterene-hydrochlorothiazide (DYAZIDE) 37.5-25 MG per capsule Take 1 each (1 capsule total) by mouth every morning. 90 capsule 2  . vitamin C (ASCORBIC ACID) 500 MG tablet Take 500 mg by mouth daily.     No current facility-administered medications on file prior to visit.   Family History  Problem Relation Age of Onset  . Malignant hyperthermia Neg Hx   . Hypotension Neg Hx   . Pseudochol deficiency Neg Hx   . Hypertension Mother   . Hypertension Father   . Cancer Brother     prostate  . Stroke Brother   . Cancer Cousin   . Cancer Cousin    History  Social History  . Marital Status: Single    Spouse Name: N/A  . Number of Children: N/A  . Years of Education: N/A   Occupational History  . Not on file.   Social History Main Topics  . Smoking status: Former Smoker    Types: Cigarettes    Quit date: 01/10/1990  . Smokeless tobacco: Never Used     Comment: quit in 1992  . Alcohol Use: Yes     Comment: wine few times a week  . Drug Use: No  . Sexual Activity: Not Currently    Birth Control/ Protection: Surgical   Other Topics Concern  . Not on file   Social History Narrative    Review of Systems: Constitutional: Negative for fever, chills, appetite change, weight loss,  Fatigue. Skin: Negative for rashes or lesions of concern. HENT: Negative for ear pain, ear discharge.nose bleeds Eyes: Negative for pain, discharge,  redness, itching and visual disturbance. Positive for dry eyes on restatis. Neck: Negative for pain, stiffness Respiratory: Negative for cough, shortness of breath,   Cardiovascular: Negative for chest pain, palpitations. Positive for pedal edema Gastrointestinal: Negative for abdominal pain, nausea, vomiting, diarrhea, constipations Genitourinary: Negative for dysuria, urgency, frequency, hematuria,  Musculoskeletal: Negative for back pain, joint pain, joint  swelling, and gait problem.Negative for weakness. Positive for bilateral knee replacement  Neurological: Negative for dizziness, tremors, seizures, syncope,   light-headedness, numbness and headaches.  Hematological: Negative for easy bruising or bleeding Psychiatric/Behavioral: Negative for depression, anxiety, decreased concentration, confusion   Objective:   Filed Vitals:   08/01/14 1403  BP: 153/68  Pulse: 77  Temp: 98.5 F (36.9 C)  Resp: 16    Physical Exam: Constitutional: Patient appears well-developed and well-nourished. No distress. HENT: Normocephalic, atraumatic, External right and left ear normal. Oropharynx is clear and moist.  Eyes: Conjunctivae and EOM are normal. PERRLA, no scleral icterus. Neck: Normal ROM. Neck supple. No lymphadenopathy, No thyromegaly. CVS: RRR, S1/S2 +, no murmurs, no gallops, no rubs Pulmonary: Effort and breath sounds normal, no stridor, rhonchi, wheezes, rales.  Abdominal: Soft. Normoactive BS,, no distension, tenderness, rebound or guarding.  Musculoskeletal: Normal range of motion. No edema and no tenderness.  Neuro: Alert.Normal muscle tone coordination. Non-focal Skin: Skin is warm and dry. No rash noted. Not diaphoretic. No erythema. No pallor. Psychiatric: Normal mood and affect. Behavior, judgment, thought content normal.  Lab Results  Component Value Date   WBC 3.9 04/18/2014   HGB 12.8 04/18/2014   HCT 39.9 04/18/2014   MCV 94.8 04/18/2014   PLT 308 04/18/2014   Lab  Results  Component Value Date   CREATININE 0.8 04/18/2014   BUN 16.4 04/18/2014   NA 145 04/18/2014   K 3.2* 04/18/2014   CL 103 11/29/2013   CO2 30* 04/18/2014    Lab Results  Component Value Date   HGBA1C 5.2 05/08/2013   Lipid Panel     Component Value Date/Time   CHOL 204* 08/29/2013 1548   TRIG 84 08/29/2013 1548   HDL 90 08/29/2013 1548   CHOLHDL 2.3 08/29/2013 1548   VLDL 17 08/29/2013 1548   LDLCALC 97 08/29/2013 1548       Assessment and plan:   Health Maintenance -we have discussed Prevnar 13 and Zoster. She will check with her insurance about coverage. -Have discussed healthy life style including diet, exercise, safety measures, including fall prevention  Hypertension -continue current meds.  Hypokalemia -Refill of Potassium  Hyperlipidemia -Avoid fats and cholesterol in diet, exercise  regularly -continue current medication  Follow-up in 6 months for routine follow-up of chronic conditions.   Micheline Chapman, FNP-BC     No Follow-up on file.  The patient was given clear instructions to go to ER or return to medical center if symptoms don't improve, worsen or new problems develop. The patient verbalized understanding. The patient was told to call to get lab results if they haven't heard anything in the next week.         08/01/2014, 2:23 PM

## 2014-08-19 DIAGNOSIS — L84 Corns and callosities: Secondary | ICD-10-CM | POA: Diagnosis not present

## 2014-08-19 DIAGNOSIS — L602 Onychogryphosis: Secondary | ICD-10-CM | POA: Diagnosis not present

## 2014-09-13 DIAGNOSIS — Z23 Encounter for immunization: Secondary | ICD-10-CM | POA: Diagnosis not present

## 2014-10-31 ENCOUNTER — Telehealth: Payer: Self-pay | Admitting: Hematology and Oncology

## 2014-10-31 NOTE — Telephone Encounter (Signed)
s.w. pt and adivsedon 4.14 appt cx and moved to 4.21 due to me on pal....pt ok and aware

## 2014-11-28 DIAGNOSIS — L84 Corns and callosities: Secondary | ICD-10-CM | POA: Diagnosis not present

## 2014-11-28 DIAGNOSIS — L602 Onychogryphosis: Secondary | ICD-10-CM | POA: Diagnosis not present

## 2015-02-02 ENCOUNTER — Ambulatory Visit: Payer: Medicare Other | Admitting: Family Medicine

## 2015-02-12 ENCOUNTER — Ambulatory Visit (INDEPENDENT_AMBULATORY_CARE_PROVIDER_SITE_OTHER): Payer: Medicare Other | Admitting: Family Medicine

## 2015-02-12 ENCOUNTER — Other Ambulatory Visit: Payer: Self-pay

## 2015-02-12 ENCOUNTER — Encounter: Payer: Self-pay | Admitting: Family Medicine

## 2015-02-12 VITALS — BP 153/71 | HR 75 | Temp 98.0°F | Resp 20 | Wt 216.0 lb

## 2015-02-12 DIAGNOSIS — E876 Hypokalemia: Secondary | ICD-10-CM

## 2015-02-12 DIAGNOSIS — I10 Essential (primary) hypertension: Secondary | ICD-10-CM

## 2015-02-12 DIAGNOSIS — E785 Hyperlipidemia, unspecified: Secondary | ICD-10-CM | POA: Diagnosis not present

## 2015-02-12 DIAGNOSIS — Z23 Encounter for immunization: Secondary | ICD-10-CM | POA: Diagnosis not present

## 2015-02-12 MED ORDER — AMLODIPINE BESY-BENAZEPRIL HCL 10-40 MG PO CAPS: 1.0000 | ORAL_CAPSULE | Freq: Every day | ORAL | Status: DC

## 2015-02-12 MED ORDER — ATORVASTATIN CALCIUM 40 MG PO TABS: 40.0000 mg | ORAL_TABLET | Freq: Every day | ORAL | Status: DC

## 2015-02-12 MED ORDER — POTASSIUM CHLORIDE CRYS ER 20 MEQ PO TBCR: 20.0000 meq | EXTENDED_RELEASE_TABLET | Freq: Every day | ORAL | Status: DC

## 2015-02-12 MED ORDER — TRIAMTERENE-HCTZ 37.5-25 MG PO CAPS: 1.0000 | ORAL_CAPSULE | ORAL | Status: DC

## 2015-02-12 MED ORDER — AMLODIPINE BESY-BENAZEPRIL HCL 10-40 MG PO CAPS
1.0000 | ORAL_CAPSULE | Freq: Every day | ORAL | Status: DC
Start: 2015-02-12 — End: 2015-02-12

## 2015-02-12 NOTE — Patient Instructions (Signed)
I have discontinued benazapril 10 and changed amlodopine/benazapril to 10/40. Continue other medications as are. Be careful of fats, cholesterol and concentrated sweets in diet Try to exercise 150 minutes a week.

## 2015-02-12 NOTE — Progress Notes (Signed)
Patient ID: Kara Diaz, female   DOB: [birth year], 75 y.o.   MRN: [id number]   Kara Diaz, is a 75 y.o. female  [id number]  [id number]  DOB - 1940/07/26  CC:  Chief Complaint  Patient presents with  . Follow-up       HPI: Kara Diaz is a 75 y.o. female here to follow-up on chronic conditions and get refills on her medications.  She is on amlodipine/benazapril 10/10 + benazapril 10  And dyazide 37-12.5 mg for hypertension, atorvostatin 40 for hyperlipidemia, Potassium Chloride 20 meq for hypokalemia. She denies chest pain, palpatations, shortness. Admits to occassional mild pedal edema.She has had her flu shot this year and was had Pneumococcal-23. Now need Prevnar 13. Her BP today is 151/71.  No Known Allergies Past Medical History  Diagnosis Date  . Angina   . PONV (postoperative nausea and vomiting)   . Hypertension     takes Dyazide and Lotrel daily  . Hyperlipidemia     doesn't take any meds for this  . Pneumonia     hx of in 1992  . Joint pain   . Joint swelling   . Arthritis     all over  . Hemorrhoids   . Urinary frequency   . Early cataracts, bilateral   . Insomnia     but doesn't take anything for this  . 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 History of radiation therapy 06/07/11-07/11/11    left breast 50Gy/3fx   Current Outpatient Prescriptions on File Prior to Visit  Medication Sig Dispense Refill  . anastrozole (ARIMIDEX) 1 MG tablet Take 1 tablet (1 mg total) by mouth daily. 90 tablet 3  . aspirin EC 81 MG tablet Take 81 mg by mouth daily.    . Calcium 600-200 MG-UNIT per tablet Take 1 tablet by mouth daily.    . Cholecalciferol (VITAMIN D3) 2000 UNITS TABS Take 1 tablet by mouth daily.    . cycloSPORINE (RESTASIS) 0.05 % ophthalmic emulsion Place 1 drop into both eyes 2 (two) times daily.    . Magnesium 400 MG CAPS Take 1 tablet by mouth daily.    . Multiple Vitamins-Minerals (MULTIVITAMINS  THER. W/MINERALS) TABS Take 1 tablet by mouth daily.    . vitamin C (ASCORBIC ACID) 500 MG tablet Take 500 mg by mouth daily.     No current facility-administered medications on file prior to visit.   Family History  Problem Relation Age of Onset  . Malignant hyperthermia Neg Hx   . Hypotension Neg Hx   . Pseudochol deficiency Neg Hx   . Hypertension Mother   . Hypertension Father   . Cancer Brother     prostate  . Stroke Brother   . Cancer Cousin   . Cancer Cousin    Social History   Social History  . Marital Status: Single    Spouse Name: N/A  . Number of Children: N/A  . Years of Education: N/A   Occupational History  . Not on file.   Social History Main Topics  . Smoking status: Former Smoker    Types: Cigarettes    Quit date: 01/10/1990  . Smokeless tobacco: Never Used     Comment: quit in 1992  . Alcohol Use: Yes     Comment: wine few times a week  . Drug Use: No  . Sexual Activity: Not Currently    Birth Control/ Protection: Surgical   Other  Topics Concern  . Not on file   Social History Narrative    Review of Systems: Constitutional: Negative for fever, chills, appetite change, weight loss,  Fatigue. Skin: Negative for rashes or lesions of concern. HENT: Negative for ear pain, ear discharge.nose bleeds Eyes: Negative for pain, discharge, redness, itching and visual disturbance. Neck: Negative for pain, stiffness Respiratory: Negative for cough, shortness of breath,   Cardiovascular: Negative for chest pain, palpitations. Positive for occassional swelling of ankles and feet if stands too long. Gastrointestinal: Negative for abdominal pain, nausea, vomiting, diarrhea, constipations Genitourinary: Negative for dysuria, urgency, frequency, hematuria,  Musculoskeletal: Negative for back pain, joint pain, joint  swelling, and gait problem.Negative for weakness. Positive for bilateral ankle pain in bed at night Neurological: Negative for dizziness, tremors,  seizures, syncope,   light-headedness, numbness and headaches.  Hematological: Negative for easy bruising or bleeding Psychiatric/Behavioral: Negative for depression, anxiety, decreased concentration, confusion. Positive for increased stress due to caring for brother with dementia   Objective:   Filed Vitals:   02/12/15 1046  BP: 153/71  Pulse: 75  Temp: 98 F (36.7 C)  Resp: 20    Physical Exam: Constitutional: Patient appears well-developed and well-nourished. No distress. HENT: Normocephalic, atraumatic, External right and left ear normal. Oropharynx is clear and moist.  Eyes: Conjunctivae and EOM are normal. PERRLA, no scleral icterus. Neck: Normal ROM. Neck supple. No lymphadenopathy, No thyromegaly. CVS: RRR, S1/S2 +, no murmurs, no gallops, no rubs Pulmonary: Effort and breath sounds normal, no stridor, rhonchi, wheezes, rales.  Abdominal: Soft. Normoactive BS,, no distension, tenderness, rebound or guarding.  Musculoskeletal: Normal range of motion. No edema and no tenderness.  Neuro: Alert.Normal muscle tone coordination. Non-focal Skin: Skin is warm and dry. No rash noted. Not diaphoretic. No erythema. No pallor. Psychiatric: Normal mood and affect. Behavior, judgment, thought content normal.  Lab Results  Component Value Date   WBC 3.9 04/18/2014   HGB 12.8 04/18/2014   HCT 39.9 04/18/2014   MCV 94.8 04/18/2014   PLT 308 04/18/2014   Lab Results  Component Value Date   CREATININE 0.8 04/18/2014   BUN 16.4 04/18/2014   NA 145 04/18/2014   K 3.2* 04/18/2014   CL 103 11/29/2013   CO2 30* 04/18/2014    Lab Results  Component Value Date   HGBA1C 5.2 05/08/2013   Lipid Panel     Component Value Date/Time   CHOL 204* 08/29/2013 1548   TRIG 84 08/29/2013 1548   HDL 90 08/29/2013 1548   CHOLHDL 2.3 08/29/2013 1548   VLDL 17 08/29/2013 1548   LDLCALC 97 08/29/2013 1548       Assessment and plan:   1. Essential hypertension  - COMPLETE METABOLIC  PANEL WITH GFR - CBC w/Diff - Lipid panel - TSH  2. Need for prophylactic vaccination against Streptococcus pneumoniae (pneumococcus)  - Pneumococcal conjugate vaccine 13-valent  3. Essential hypertension, benign -Refill almodipine/benazapril and increase to 10/40, #90, one po q day with 3 refills -Stop benazapril 10 -Refill Dyazide 37-12.5, # 90, one po q day. 3 Refills.   4. Hypokalemia -Refill Potassium 20 mg daily, #90 with 3 refills   5. Hyperlipidemia -Refill atorvostatin 40, one po q day, #90 with 3 refills  Return in about 6 months (around 08/12/2015).  The patient was given clear instructions to go to ER or return to medical center if symptoms don't improve, worsen or new problems develop. The patient verbalized understanding.    Micheline Chapman FNP  02/12/2015, 12:45 PM

## 2015-02-12 NOTE — Telephone Encounter (Signed)
Medications sent into correct pharmacy. Thanks!

## 2015-03-04 DIAGNOSIS — L602 Onychogryphosis: Secondary | ICD-10-CM | POA: Diagnosis not present

## 2015-03-04 DIAGNOSIS — L84 Corns and callosities: Secondary | ICD-10-CM | POA: Diagnosis not present

## 2015-04-03 ENCOUNTER — Other Ambulatory Visit: Payer: Self-pay | Admitting: Hematology and Oncology

## 2015-04-03 ENCOUNTER — Other Ambulatory Visit: Payer: Self-pay | Admitting: Internal Medicine

## 2015-04-03 DIAGNOSIS — Z853 Personal history of malignant neoplasm of breast: Secondary | ICD-10-CM

## 2015-04-10 DIAGNOSIS — Z853 Personal history of malignant neoplasm of breast: Secondary | ICD-10-CM | POA: Diagnosis not present

## 2015-04-24 ENCOUNTER — Ambulatory Visit: Payer: Medicare Other | Admitting: Hematology and Oncology

## 2015-04-29 DIAGNOSIS — H52203 Unspecified astigmatism, bilateral: Secondary | ICD-10-CM | POA: Diagnosis not present

## 2015-04-29 DIAGNOSIS — H5213 Myopia, bilateral: Secondary | ICD-10-CM | POA: Diagnosis not present

## 2015-04-29 DIAGNOSIS — H2513 Age-related nuclear cataract, bilateral: Secondary | ICD-10-CM | POA: Diagnosis not present

## 2015-04-29 DIAGNOSIS — H04123 Dry eye syndrome of bilateral lacrimal glands: Secondary | ICD-10-CM | POA: Diagnosis not present

## 2015-05-01 ENCOUNTER — Encounter: Payer: Self-pay | Admitting: Hematology and Oncology

## 2015-05-01 ENCOUNTER — Ambulatory Visit (HOSPITAL_BASED_OUTPATIENT_CLINIC_OR_DEPARTMENT_OTHER): Payer: Medicare Other | Admitting: Hematology and Oncology

## 2015-05-01 ENCOUNTER — Telehealth: Payer: Self-pay | Admitting: Hematology and Oncology

## 2015-05-01 VITALS — BP 157/64 | HR 70 | Temp 98.2°F | Resp 19 | Wt 220.3 lb

## 2015-05-01 DIAGNOSIS — L408 Other psoriasis: Secondary | ICD-10-CM

## 2015-05-01 DIAGNOSIS — M359 Systemic involvement of connective tissue, unspecified: Secondary | ICD-10-CM | POA: Diagnosis not present

## 2015-05-01 DIAGNOSIS — D638 Anemia in other chronic diseases classified elsewhere: Secondary | ICD-10-CM | POA: Diagnosis not present

## 2015-05-01 DIAGNOSIS — D696 Thrombocytopenia, unspecified: Secondary | ICD-10-CM | POA: Diagnosis not present

## 2015-05-01 DIAGNOSIS — Z86000 Personal history of in-situ neoplasm of breast: Secondary | ICD-10-CM | POA: Diagnosis not present

## 2015-05-01 DIAGNOSIS — D0512 Intraductal carcinoma in situ of left breast: Secondary | ICD-10-CM

## 2015-05-01 MED ORDER — ANASTROZOLE 1 MG PO TABS: 1.0000 mg | ORAL_TABLET | Freq: Every day | ORAL | Status: DC

## 2015-05-01 NOTE — Progress Notes (Signed)
Patient Care Team: Leana Gamer, MD as PCP - General (Internal Medicine)  DIAGNOSIS: Ductal carcinoma in situ (DCIS) of left breast   Staging form: Breast, AJCC 7th Edition     Clinical: Stage 0 (Tis, N0, cM0) - Unsigned       Staging comments: Staged in Breast Cancer Conference 3.27.13      Pathologic: No stage assigned - Unsigned   CHIEF COMPLIANT:  Follow-up on anastrozole  INTERVAL HISTORY: Kara Diaz is a  75 year old with above-mentioned history of DCIS left breast was been on antiestrogen therapy with anastrozole. She appears to be tolerating it very well. She does not have any hot flashes and myalgias. Her brother was diagnosed with end-stage kidney disease and prostate cancer along with dementia from stroke. She is a primary caregiver. It is stressing her out. She does not like taking care of anyone ill or sick. It appears that this causes the stress her sugars have been climbing. She denies any lumps or nodules in the breasts.  REVIEW OF SYSTEMS:   Constitutional: Denies fevers, chills or abnormal weight loss Eyes: Denies blurriness of vision Ears, nose, mouth, throat, and face: Denies mucositis or sore throat Respiratory: Denies cough, dyspnea or wheezes Cardiovascular: Denies palpitation, chest discomfort Gastrointestinal:  Denies nausea, heartburn or change in bowel habits Skin: Denies abnormal skin rashes Lymphatics: Denies new lymphadenopathy or easy bruising Neurological:Denies numbness, tingling or new weaknesses Behavioral/Psych: Mood is stable, no new changes  Extremities: No lower extremity edema Breast:  denies any pain or lumps or nodules in either breasts All other systems were reviewed with the patient and are negative.  I have reviewed the past medical history, past surgical history, social history and family history with the patient and they are unchanged from previous note.  ALLERGIES:  has No Known Allergies.  MEDICATIONS:  Current  Outpatient Prescriptions  Medication Sig Dispense Refill  . amLODipine-benazepril (LOTREL) 10-40 MG capsule Take 1 capsule by mouth daily. 90 capsule 3  . anastrozole (ARIMIDEX) 1 MG tablet Take 1 tablet (1 mg total) by mouth daily. 90 tablet 3  . aspirin EC 81 MG tablet Take 81 mg by mouth daily.    Marland Kitchen atorvastatin (LIPITOR) 40 MG tablet Take 1 tablet (40 mg total) by mouth daily. 90 tablet 3  . Calcium 600-200 MG-UNIT per tablet Take 1 tablet by mouth daily.    . Cholecalciferol (VITAMIN D3) 2000 UNITS TABS Take 1 tablet by mouth daily.    . cycloSPORINE (RESTASIS) 0.05 % ophthalmic emulsion Place 1 drop into both eyes 2 (two) times daily.    . Magnesium 400 MG CAPS Take 1 tablet by mouth daily.    . Multiple Vitamins-Minerals (MULTIVITAMINS THER. W/MINERALS) TABS Take 1 tablet by mouth daily.    . potassium chloride SA (K-DUR,KLOR-CON) 20 MEQ tablet Take 1 tablet (20 mEq total) by mouth daily. 90 tablet 3  . triamterene-hydrochlorothiazide (DYAZIDE) 37.5-25 MG capsule Take 1 each (1 capsule total) by mouth every morning. 90 capsule 3  . vitamin C (ASCORBIC ACID) 500 MG tablet Take 500 mg by mouth daily.     No current facility-administered medications for this visit.    PHYSICAL EXAMINATION: ECOG PERFORMANCE STATUS: 0 - Asymptomatic  Filed Vitals:   05/01/15 1151  BP: 157/64  Pulse: 70  Temp: 98.2 F (36.8 C)  Resp: 19   Filed Weights   05/01/15 1151  Weight: 220 lb 4.8 oz (99.927 kg)    GENERAL:alert, no distress and comfortable  SKIN: skin color, texture, turgor are normal, no rashes or significant lesions EYES: normal, Conjunctiva are pink and non-injected, sclera clear OROPHARYNX:no exudate, no erythema and lips, buccal mucosa, and tongue normal  NECK: supple, thyroid normal size, non-tender, without nodularity LYMPH:  no palpable lymphadenopathy in the cervical, axillary or inguinal LUNGS: clear to auscultation and percussion with normal breathing effort HEART: regular  rate & rhythm and no murmurs and no lower extremity edema ABDOMEN:abdomen soft, non-tender and normal bowel sounds MUSCULOSKELETAL:no cyanosis of digits and no clubbing  NEURO: alert & oriented x 3 with fluent speech, no focal motor/sensory deficits EXTREMITIES: No lower extremity edema  LABORATORY DATA:  I have reviewed the data as listed   Chemistry      Component Value Date/Time   NA 145 04/18/2014 1058   NA 144 11/29/2013 1214   K 3.2* 04/18/2014 1058   K 3.4* 11/29/2013 1214   CL 103 11/29/2013 1214   CL 106 04/06/2012 1128   CO2 30* 04/18/2014 1058   CO2 30 11/29/2013 1214   BUN 16.4 04/18/2014 1058   BUN 14 11/29/2013 1214   CREATININE 0.8 04/18/2014 1058   CREATININE 0.69 11/29/2013 1214   CREATININE 0.55 05/13/2011 1357      Component Value Date/Time   CALCIUM 10.4 04/18/2014 1058   CALCIUM 10.9* 11/29/2013 1214   ALKPHOS 115 04/18/2014 1058   ALKPHOS 102 11/29/2013 1214   AST 17 04/18/2014 1058   AST 19 11/29/2013 1214   ALT 12 04/18/2014 1058   ALT 10 11/29/2013 1214   BILITOT 1.19 04/18/2014 1058   BILITOT 1.0 11/29/2013 1214       Lab Results  Component Value Date   WBC 3.9 04/18/2014   HGB 12.8 04/18/2014   HCT 39.9 04/18/2014   MCV 94.8 04/18/2014   PLT 308 04/18/2014   NEUTROABS 2.2 04/18/2014   ASSESSMENT & PLAN:  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 Few myalgias could be related to underlying arthritis 2. Denies any vaginal bleeding  Breast cancer surveillance: 1. Breast exam 05/01/2015 is normal 2. Mammograms will be done later this month. 3. Bone density 05/03/2013 showed a T score -1.4 mild  osteopenia  Survivorship:Discussed the importance of physical exercise in decreasing the likelihood of breast cancer recurrence. Recommended 30 mins daily 6 days a week of either brisk walking or cycling or swimming. Encouraged patient to eat more fruits and vegetables and decrease red meat.   Return to clinic in 1 year for follow-up   No orders of the defined types were placed in this encounter.   The patient has a good understanding of the overall plan. she agrees with it. she will call with any problems that may develop before the next visit here.   Rulon Eisenmenger, MD 05/01/2015

## 2015-05-01 NOTE — Telephone Encounter (Signed)
appt made and avs printed °

## 2015-05-01 NOTE — Assessment & Plan Note (Signed)
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 Few myalgias could be related to underlying arthritis 2. Denies any vaginal bleeding  Breast cancer surveillance: 1. Breast exam 05/01/2015 is normal 2. Mammograms will be done later this month. 3. Bone density 05/03/2013 showed a T score -1.4 mild osteopenia  Survivorship:Discussed the importance of physical exercise in decreasing the likelihood of breast cancer recurrence. Recommended 30 mins daily 6 days a week of either brisk walking or cycling or swimming. Encouraged patient to eat more fruits and vegetables and decrease red meat.   Return to clinic in 1 year for follow-up

## 2015-05-07 ENCOUNTER — Ambulatory Visit
Admission: RE | Admit: 2015-05-07 | Discharge: 2015-05-07 | Disposition: A | Discharge status: Home / Self Care | Payer: Medicare Other | Source: Ambulatory Visit | Attending: Hematology and Oncology | Admitting: Hematology and Oncology

## 2015-05-07 DIAGNOSIS — R928 Other abnormal and inconclusive findings on diagnostic imaging of breast: Secondary | ICD-10-CM | POA: Diagnosis not present

## 2015-05-07 DIAGNOSIS — Z853 Personal history of malignant neoplasm of breast: Secondary | ICD-10-CM

## 2015-05-27 DIAGNOSIS — L84 Corns and callosities: Secondary | ICD-10-CM | POA: Diagnosis not present

## 2015-05-27 DIAGNOSIS — L602 Onychogryphosis: Secondary | ICD-10-CM | POA: Diagnosis not present

## 2015-08-20 ENCOUNTER — Ambulatory Visit: Payer: Medicare Other | Admitting: Family Medicine

## 2015-08-26 DIAGNOSIS — L84 Corns and callosities: Secondary | ICD-10-CM | POA: Diagnosis not present

## 2015-08-26 DIAGNOSIS — L602 Onychogryphosis: Secondary | ICD-10-CM | POA: Diagnosis not present

## 2015-08-26 DIAGNOSIS — G64 Other disorders of peripheral nervous system: Secondary | ICD-10-CM | POA: Diagnosis not present

## 2015-09-11 ENCOUNTER — Encounter: Payer: Self-pay | Admitting: Family Medicine

## 2015-09-11 ENCOUNTER — Ambulatory Visit (INDEPENDENT_AMBULATORY_CARE_PROVIDER_SITE_OTHER): Payer: Medicare Other | Admitting: Family Medicine

## 2015-09-11 VITALS — BP 129/68 | HR 74 | Temp 98.6°F | Resp 18 | Ht 63.0 in | Wt 211.0 lb

## 2015-09-11 DIAGNOSIS — I10 Essential (primary) hypertension: Secondary | ICD-10-CM

## 2015-09-11 DIAGNOSIS — Z131 Encounter for screening for diabetes mellitus: Secondary | ICD-10-CM

## 2015-09-11 LAB — CBC WITH DIFFERENTIAL/PLATELET
BASOS PCT: 0 %
Basophils Absolute: 0 cells/uL (ref 0–200)
EOS ABS: 114 {cells}/uL (ref 15–500)
Eosinophils Relative: 3 %
HCT: 39.3 % (ref 35.0–45.0)
Hemoglobin: 13 g/dL (ref 11.7–15.5)
LYMPHS PCT: 33 %
Lymphs Abs: 1254 cells/uL (ref 850–3900)
MCH: 31.4 pg (ref 27.0–33.0)
MCHC: 33.1 g/dL (ref 32.0–36.0)
MCV: 94.9 fL (ref 80.0–100.0)
MONOS PCT: 5 %
MPV: 10.4 fL (ref 7.5–12.5)
Monocytes Absolute: 190 cells/uL — ABNORMAL LOW (ref 200–950)
NEUTROS PCT: 59 %
Neutro Abs: 2242 cells/uL (ref 1500–7800)
PLATELETS: 279 10*3/uL (ref 140–400)
RBC: 4.14 MIL/uL (ref 3.80–5.10)
RDW: 12.8 % (ref 11.0–15.0)
WBC: 3.8 10*3/uL (ref 3.8–10.8)

## 2015-09-11 LAB — COMPLETE METABOLIC PANEL WITH GFR
ALBUMIN: 4.5 g/dL (ref 3.6–5.1)
ALT: 16 U/L (ref 6–29)
AST: 23 U/L (ref 10–35)
Alkaline Phosphatase: 86 U/L (ref 33–130)
BILIRUBIN TOTAL: 1 mg/dL (ref 0.2–1.2)
BUN: 11 mg/dL (ref 7–25)
CALCIUM: 10.4 mg/dL (ref 8.6–10.4)
CO2: 28 mmol/L (ref 20–31)
CREATININE: 0.68 mg/dL (ref 0.60–0.93)
Chloride: 104 mmol/L (ref 98–110)
GFR, Est Non African American: 86 mL/min (ref >=60)
Glucose, Bld: 80 mg/dL (ref 65–99)
Potassium: 3.7 mmol/L (ref 3.5–5.3)
Sodium: 141 mmol/L (ref 135–146)
TOTAL PROTEIN: 7.5 g/dL (ref 6.1–8.1)

## 2015-09-11 LAB — LIPID PANEL
Cholesterol: 157 mg/dL (ref 125–200)
HDL: 96 mg/dL (ref >=46)
LDL Cholesterol: 49 mg/dL (ref <130)
Total CHOL/HDL Ratio: 1.6 Ratio (ref <=5.0)
Triglycerides: 61 mg/dL (ref <150)
VLDL: 12 mg/dL (ref <30)

## 2015-09-11 NOTE — Progress Notes (Signed)
Kara Diaz, is a 75 y.o. female  [id number]  [id number]  DOB - 1940-12-19  CC:  Chief Complaint  Patient presents with  . Follow-up       HPI: Kara Diaz is a 75 y.o. female here for follow-up chronic conditions. She has a history of breast cancer and is followed at [facility]. She has diagnoses of hyperlipidemia, hypertension, arthritis, angina. She is on Lotrel 10-40, K+ supplement, dyazide, atorvostatin.She does not need refills today.  Health Maintenance: She needs diabetic screening with A1C. She needs flu shot when that is available.  Otherwise she is UTD.  No Known Allergies Past Medical History:  Diagnosis Date  . Angina   . Arthritis    all over  . 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 Early cataracts, bilateral   . Hemorrhoids   . History of radiation therapy 06/07/11-07/11/11   left breast 50Gy/30fx  . Hyperlipidemia    doesn't take any meds for this  . Hypertension    takes Dyazide and Lotrel daily  . Insomnia    but doesn't take anything for this  . Joint pain   . Joint swelling   . Pneumonia    hx of in 1992  . PONV (postoperative nausea and vomiting)   . Urinary frequency    Current Outpatient Prescriptions on File Prior to Visit  Medication Sig Dispense Refill  . amLODipine-benazepril (LOTREL) 10-40 MG capsule Take 1 capsule by mouth daily. 90 capsule 3  . anastrozole (ARIMIDEX) 1 MG tablet Take 1 tablet (1 mg total) by mouth daily. 90 tablet 3  . aspirin EC 81 MG tablet Take 81 mg by mouth daily.    Marland Kitchen atorvastatin (LIPITOR) 40 MG tablet Take 1 tablet (40 mg total) by mouth daily. 90 tablet 3  . Calcium 600-200 MG-UNIT per tablet Take 1 tablet by mouth daily.    . Cholecalciferol (VITAMIN D3) 2000 UNITS TABS Take 1 tablet by mouth daily.    . cycloSPORINE (RESTASIS) 0.05 % ophthalmic emulsion Place 1 drop into both eyes 2 (two) times daily.    . Magnesium 400 MG CAPS Take 1 tablet by  mouth daily.    . Multiple Vitamins-Minerals (MULTIVITAMINS THER. W/MINERALS) TABS Take 1 tablet by mouth daily.    . potassium chloride SA (K-DUR,KLOR-CON) 20 MEQ tablet Take 1 tablet (20 mEq total) by mouth daily. 90 tablet 3  . triamterene-hydrochlorothiazide (DYAZIDE) 37.5-25 MG capsule Take 1 each (1 capsule total) by mouth every morning. 90 capsule 3  . vitamin C (ASCORBIC ACID) 500 MG tablet Take 500 mg by mouth daily.     No current facility-administered medications on file prior to visit.    Family History  Problem Relation Age of Onset  . Hypertension Mother   . Hypertension Father   . Cancer Brother     prostate  . Stroke Brother   . Cancer Cousin   . Cancer Cousin   . Malignant hyperthermia Neg Hx   . Hypotension Neg Hx   . Pseudochol deficiency Neg Hx    Social History   Social History  . Marital status: Single    Spouse name: N/A  . Number of children: N/A  . Years of education: N/A   Occupational History  . Not on file.   Social History Main Topics  . Smoking status: Former Smoker    Types: Cigarettes    Quit date: 01/10/1990  . Smokeless tobacco:  Never Used     Comment: quit in 1992  . Alcohol use Yes     Comment: wine few times a week  . Drug use: No  . Sexual activity: Not Currently    Birth control/ protection: Surgical   Other Topics Concern  . Not on file   Social History Narrative  . No narrative on file    Review of Systems: Constitutional: Negative Skin: Negative HENT: Negative  Eyes: Negative  Neck: Negative Respiratory: Negative Cardiovascular: Some swelling of feet and legs. Gastrointestinal: Negative Genitourinary: Negative  Musculoskeletal: Negative   Neurological: Negative for Hematological: Negative  Psychiatric/Behavioral: Positive for situational depression (brother died recently)   Objective:   Vitals:   09/11/15 1140  BP: 129/68  Pulse: 74  Resp: 18  Temp: 98.6 F (37 C)    Physical Exam: Constitutional:  Patient appears well-developed and well-nourished. No distress. HENT: Normocephalic, atraumatic, External right and left ear normal. Oropharynx is clear and moist.  Eyes: Conjunctivae and EOM are normal. PERRLA, no scleral icterus. Neck: Normal ROM. Neck supple. No lymphadenopathy, No thyromegaly. CVS: RRR, S1/S2 +, no murmurs, no gallops, no rubs Pulmonary: Effort and breath sounds normal, no stridor, rhonchi, wheezes, rales.  Abdominal: Soft. Normoactive BS,, no distension, tenderness, rebound or guarding.  Musculoskeletal: Normal range of motion. No edema and no tenderness.  Neuro: Alert.Normal muscle tone coordination. Non-focal Skin: Skin is warm and dry. No rash noted. Not diaphoretic. No erythema. No pallor. Psychiatric: Normal mood and affect. Behavior, judgment, thought content normal.  Lab Results  Component Value Date   WBC 3.9 04/18/2014   HGB 12.8 04/18/2014   HCT 39.9 04/18/2014   MCV 94.8 04/18/2014   PLT 308 04/18/2014   Lab Results  Component Value Date   CREATININE 0.8 04/18/2014   BUN 16.4 04/18/2014   NA 145 04/18/2014   K 3.2 (L) 04/18/2014   CL 103 11/29/2013   CO2 30 (H) 04/18/2014    Lab Results  Component Value Date   HGBA1C 5.2 05/08/2013   Lipid Panel     Component Value Date/Time   CHOL 204 (H) 08/29/2013 1548   TRIG 84 08/29/2013 1548   HDL 90 08/29/2013 1548   CHOLHDL 2.3 08/29/2013 1548   VLDL 17 08/29/2013 1548   LDLCALC 97 08/29/2013 1548       Assessment and plan:   1. Essential hypertension  - COMPLETE METABOLIC PANEL WITH GFR - CBC with Differential - Lipid panel - TSH  2. Screening for diabetes mellitus A1C   Return in about 6 months (around 03/10/2016).  The patient was given clear instructions to go to ER or return to medical center if symptoms don't improve, worsen or new problems develop. The patient verbalized understanding.    Micheline Chapman FNP  09/11/2015, 4:09 PM

## 2015-09-11 NOTE — Progress Notes (Signed)
Patient is here for 6 month FU  Patient denies pain at this time.  Patient has taken medication today and patient has eaten.

## 2015-09-11 NOTE — Patient Instructions (Signed)
Continue current medications. 

## 2015-09-12 LAB — TSH: TSH: 0.7 m[IU]/L

## 2015-10-28 DIAGNOSIS — L84 Corns and callosities: Secondary | ICD-10-CM | POA: Diagnosis not present

## 2015-10-28 DIAGNOSIS — L602 Onychogryphosis: Secondary | ICD-10-CM | POA: Diagnosis not present

## 2015-11-09 ENCOUNTER — Telehealth: Payer: Self-pay | Admitting: Family Medicine

## 2015-11-09 NOTE — Telephone Encounter (Signed)
Patient called with cold symptoms. She states her temp is 98.8 orally and she has a runny nose and sore throat and slight cough. Advised patient to hydrate well, rest and use OTC rx per pharmacist at [facility], and to call back if symptoms worsen or do not improve, and or go to Urgent care/ED if after hours need arises.

## 2015-12-10 DIAGNOSIS — Z23 Encounter for immunization: Secondary | ICD-10-CM | POA: Diagnosis not present

## 2015-12-15 ENCOUNTER — Telehealth: Payer: Self-pay

## 2015-12-15 DIAGNOSIS — I10 Essential (primary) hypertension: Secondary | ICD-10-CM

## 2015-12-15 MED ORDER — AMLODIPINE BESY-BENAZEPRIL HCL 10-40 MG PO CAPS: 1.0000 | ORAL_CAPSULE | Freq: Every day | ORAL | 3 refills | Status: AC

## 2015-12-15 MED ORDER — TRIAMTERENE-HCTZ 37.5-25 MG PO CAPS
1.0000 | ORAL_CAPSULE | ORAL | 3 refills | Status: DC
Start: 2015-12-15 — End: 2021-10-25

## 2015-12-15 NOTE — Telephone Encounter (Signed)
Refills sent into pharmacy. Thanks!  

## 2016-01-13 DIAGNOSIS — L84 Corns and callosities: Secondary | ICD-10-CM | POA: Diagnosis not present

## 2016-01-13 DIAGNOSIS — L602 Onychogryphosis: Secondary | ICD-10-CM | POA: Diagnosis not present

## 2016-03-14 ENCOUNTER — Encounter: Payer: Self-pay | Admitting: Family Medicine

## 2016-03-14 ENCOUNTER — Ambulatory Visit (INDEPENDENT_AMBULATORY_CARE_PROVIDER_SITE_OTHER): Payer: Medicare Other | Admitting: Family Medicine

## 2016-03-14 VITALS — BP 136/60 | HR 69 | Temp 98.0°F | Resp 12 | Ht 63.0 in | Wt 214.0 lb

## 2016-03-14 DIAGNOSIS — E785 Hyperlipidemia, unspecified: Secondary | ICD-10-CM | POA: Diagnosis not present

## 2016-03-14 DIAGNOSIS — I10 Essential (primary) hypertension: Secondary | ICD-10-CM

## 2016-03-14 MED ORDER — ATORVASTATIN CALCIUM 40 MG PO TABS: 40.0000 mg | ORAL_TABLET | Freq: Every day | ORAL | 3 refills | Status: AC

## 2016-03-14 NOTE — Progress Notes (Signed)
Kara Diaz, is a 76 y.o. female  [id number]  [id number]  DOB - 1940/12/08  CC:  Chief Complaint  Patient presents with  . Follow-up    no complaints  . Medication Refill    needs refill on Lipitor       HPI: Kara Diaz is a 76 y.o. female here for follow-up hypertension. She is currently on amlodipine and dyazide. She is also on Lipitor and K+. She only needs refills on Lipitor today. She does not smoke, drinks 2-3 drinks on the weekend and denies illicit drug use. She offers no spontaneous complaints today.  Health Maintenance   No Known Allergies Past Medical History:  Diagnosis Date  . Angina   . Arthritis    all over  . 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 Early cataracts, bilateral   . Hemorrhoids   . History of radiation therapy 06/07/11-07/11/11   left breast 50Gy/91fx  . Hyperlipidemia    doesn't take any meds for this  . Hypertension    takes Dyazide and Lotrel daily  . Insomnia    but doesn't take anything for this  . Joint pain   . Joint swelling   . Pneumonia    hx of in 1992  . PONV (postoperative nausea and vomiting)   . Urinary frequency    Current Outpatient Prescriptions on File Prior to Visit  Medication Sig Dispense Refill  . amLODipine-benazepril (LOTREL) 10-40 MG capsule Take 1 capsule by mouth daily. 90 capsule 3  . anastrozole (ARIMIDEX) 1 MG tablet Take 1 tablet (1 mg total) by mouth daily. 90 tablet 3  . aspirin EC 81 MG tablet Take 81 mg by mouth daily.    . Calcium 600-200 MG-UNIT per tablet Take 1 tablet by mouth daily.    . Cholecalciferol (VITAMIN D3) 2000 UNITS TABS Take 1 tablet by mouth daily.    . cycloSPORINE (RESTASIS) 0.05 % ophthalmic emulsion Place 1 drop into both eyes 2 (two) times daily.    Marland Kitchen L-Methylfolate-Algae-B12-B6 (METANX) 3-90.314-2-35 MG CAPS Take 1 capsule by mouth 2 (two) times daily.    . Magnesium 400 MG CAPS Take 1 tablet by mouth daily.    .  Multiple Vitamins-Minerals (MULTIVITAMINS THER. W/MINERALS) TABS Take 1 tablet by mouth daily.    . potassium chloride SA (K-DUR,KLOR-CON) 20 MEQ tablet Take 1 tablet (20 mEq total) by mouth daily. 90 tablet 3  . triamterene-hydrochlorothiazide (DYAZIDE) 37.5-25 MG capsule Take 1 each (1 capsule total) by mouth every morning. 90 capsule 3  . vitamin C (ASCORBIC ACID) 500 MG tablet Take 500 mg by mouth daily.     No current facility-administered medications on file prior to visit.    Family History  Problem Relation Age of Onset  . Hypertension Mother   . Hypertension Father   . Cancer Brother     prostate  . Stroke Brother   . Cancer Cousin   . Cancer Cousin   . Malignant hyperthermia Neg Hx   . Hypotension Neg Hx   . Pseudochol deficiency Neg Hx    Social History   Social History  . Marital status: Single    Spouse name: N/A  . Number of children: N/A  . Years of education: N/A   Occupational History  . Not on file.   Social History Main Topics  . Smoking status: Former Smoker    Types: Cigarettes    Quit date: 01/10/1990  .  Smokeless tobacco: Never Used     Comment: quit in 1992  . Alcohol use Yes     Comment: wine few times a week  . Drug use: No  . Sexual activity: Not Currently    Birth control/ protection: Surgical   Other Topics Concern  . Not on file   Social History Narrative  . No narrative on file    Review of Systems: Constitutional: Negative Skin: Negative HENT: Negative  Eyes: Negative  Neck: Negative Respiratory: Negative Cardiovascular: Negative Gastrointestinal: Negative Genitourinary: Negative  Musculoskeletal: Negative   Neurological: Negative for Hematological: Negative  Psychiatric/Behavioral: Negative    Objective:   Vitals:   03/14/16 1338  BP: 136/60  Pulse: 69  Resp: 12  Temp: 98 F (36.7 C)    Physical Exam: Constitutional: Patient appears well-developed and well-nourished. No distress. HENT: Normocephalic,  atraumatic, External right and left ear normal. Oropharynx is clear and moist.  Eyes: Conjunctivae and EOM are normal. PERRLA, no scleral icterus. Neck: Normal ROM. Neck supple. No lymphadenopathy, No thyromegaly. CVS: RRR, S1/S2 +, no murmurs, no gallops, no rubs Pulmonary: Effort and breath sounds normal, no stridor, rhonchi, wheezes, rales.  Abdominal: Soft. Normoactive BS,, no distension, tenderness, rebound or guarding.  Musculoskeletal: Normal range of motion. No edema and no tenderness.  Neuro: Alert.Normal muscle tone coordination. Non-focal Skin: Skin is warm and dry. No rash noted. Not diaphoretic. No erythema. No pallor. Psychiatric: Normal mood and affect. Behavior, judgment, thought content normal.  Lab Results  Component Value Date   WBC 3.8 09/11/2015   HGB 13.0 09/11/2015   HCT 39.3 09/11/2015   MCV 94.9 09/11/2015   PLT 279 09/11/2015   Lab Results  Component Value Date   CREATININE 0.68 09/11/2015   BUN 11 09/11/2015   NA 141 09/11/2015   K 3.7 09/11/2015   CL 104 09/11/2015   CO2 28 09/11/2015    Lab Results  Component Value Date   HGBA1C 5.2 05/08/2013   Lipid Panel     Component Value Date/Time   CHOL 157 09/11/2015 1212   TRIG 61 09/11/2015 1212   HDL 96 09/11/2015 1212   CHOLHDL 1.6 09/11/2015 1212   VLDL 12 09/11/2015 1212   LDLCALC 49 09/11/2015 1212        Assessment and plan:   1. Essential hypertension  - COMPLETE METABOLIC PANEL WITH GFR - CBC with Differential - Lipid panel  2. Hyperlipidemia LDL goal <100  - atorvastatin (LIPITOR) 40 MG tablet; Take 1 tablet (40 mg total) by mouth daily.  Dispense: 90 tablet; Refill: 3   Return in about 6 months (around 09/14/2016).  The patient was given clear instructions to go to ER or return to medical center if symptoms don't improve, worsen or new problems develop. The patient verbalized understanding.    Micheline Chapman FNP  03/14/2016, 2:02 PM

## 2016-03-14 NOTE — Patient Instructions (Signed)
Check on colonoscopy.

## 2016-04-18 ENCOUNTER — Other Ambulatory Visit: Payer: Self-pay | Admitting: Hematology and Oncology

## 2016-04-18 DIAGNOSIS — N6320 Unspecified lump in the left breast, unspecified quadrant: Secondary | ICD-10-CM

## 2016-04-18 DIAGNOSIS — Z0000 Encounter for general adult medical examination without abnormal findings: Secondary | ICD-10-CM

## 2016-04-20 DIAGNOSIS — E1351 Other specified diabetes mellitus with diabetic peripheral angiopathy without gangrene: Secondary | ICD-10-CM | POA: Diagnosis not present

## 2016-04-20 DIAGNOSIS — M79671 Pain in right foot: Secondary | ICD-10-CM | POA: Diagnosis not present

## 2016-04-20 DIAGNOSIS — M79672 Pain in left foot: Secondary | ICD-10-CM | POA: Diagnosis not present

## 2016-04-20 DIAGNOSIS — B351 Tinea unguium: Secondary | ICD-10-CM | POA: Diagnosis not present

## 2016-04-20 DIAGNOSIS — L84 Corns and callosities: Secondary | ICD-10-CM | POA: Diagnosis not present

## 2016-04-26 ENCOUNTER — Telehealth: Payer: Self-pay | Admitting: Hematology and Oncology

## 2016-04-26 NOTE — Telephone Encounter (Signed)
sw pt to confirm r/s 4/20 appt to 5/2 at 1145 am per MD out of office

## 2016-04-29 ENCOUNTER — Ambulatory Visit: Payer: Medicare Other | Admitting: Hematology and Oncology

## 2016-05-02 DIAGNOSIS — H25013 Cortical age-related cataract, bilateral: Secondary | ICD-10-CM | POA: Diagnosis not present

## 2016-05-02 DIAGNOSIS — H52203 Unspecified astigmatism, bilateral: Secondary | ICD-10-CM | POA: Diagnosis not present

## 2016-05-02 DIAGNOSIS — H04123 Dry eye syndrome of bilateral lacrimal glands: Secondary | ICD-10-CM | POA: Diagnosis not present

## 2016-05-02 DIAGNOSIS — H5213 Myopia, bilateral: Secondary | ICD-10-CM | POA: Diagnosis not present

## 2016-05-02 DIAGNOSIS — H524 Presbyopia: Secondary | ICD-10-CM | POA: Diagnosis not present

## 2016-05-10 ENCOUNTER — Ambulatory Visit: Payer: Medicare Other | Admitting: Hematology and Oncology

## 2016-05-10 NOTE — Assessment & Plan Note (Signed)
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 Few myalgias could be related to underlying arthritis 2. Denies any vaginal bleeding  Breast cancer surveillance: 1. Breast exam 05/10/16 is normal 2. Mammograms to be done 05/13/16. 3. Bone density 05/03/2013 showed a T score -1.4 mild osteopenia  Return to clinic in 1 year for follow-up

## 2016-05-11 ENCOUNTER — Other Ambulatory Visit: Payer: Self-pay | Admitting: Hematology and Oncology

## 2016-05-11 ENCOUNTER — Encounter: Payer: Self-pay | Admitting: Hematology and Oncology

## 2016-05-11 ENCOUNTER — Ambulatory Visit (HOSPITAL_BASED_OUTPATIENT_CLINIC_OR_DEPARTMENT_OTHER): Payer: Medicare Other | Admitting: Hematology and Oncology

## 2016-05-11 DIAGNOSIS — Z853 Personal history of malignant neoplasm of breast: Secondary | ICD-10-CM

## 2016-05-11 DIAGNOSIS — Z79811 Long term (current) use of aromatase inhibitors: Secondary | ICD-10-CM | POA: Diagnosis not present

## 2016-05-11 DIAGNOSIS — D0512 Intraductal carcinoma in situ of left breast: Secondary | ICD-10-CM

## 2016-05-11 NOTE — Progress Notes (Signed)
Patient Care Team: Micheline Chapman, NP as PCP - General (Family Medicine)  DIAGNOSIS:  Encounter Diagnosis  Name Primary?  . Ductal carcinoma in situ (DCIS) of left breast     CHIEF COMPLIANT: Follow-up of DCIS  INTERVAL HISTORY: Kara Diaz is a 76 year old with above-mentioned history of left breast DCIS who is currently on Arimidex therapy and appears to be tolerating it extremely well. She denies any hot flashes or myalgias. She completed 5 years of antiestrogen therapy. She denies any lumps or nodules in the breast.  REVIEW OF SYSTEMS:   Constitutional: Denies fevers, chills or abnormal weight loss Eyes: Denies blurriness of vision Ears, nose, mouth, throat, and face: Denies mucositis or sore throat Respiratory: Denies cough, dyspnea or wheezes Cardiovascular: Denies palpitation, chest discomfort Gastrointestinal:  Denies nausea, heartburn or change in bowel habits Skin: Denies abnormal skin rashes Lymphatics: Denies new lymphadenopathy or easy bruising Neurological:Denies numbness, tingling or new weaknesses Behavioral/Psych: Mood is stable, no new changes  Extremities: No lower extremity edema Breast:  denies any pain or lumps or nodules in either breasts All other systems were reviewed with the patient and are negative.  I have reviewed the past medical history, past surgical history, social history and family history with the patient and they are unchanged from previous note.  ALLERGIES:  has No Known Allergies.  MEDICATIONS:  Current Outpatient Prescriptions  Medication Sig Dispense Refill  . amLODipine-benazepril (LOTREL) 10-40 MG capsule Take 1 capsule by mouth daily. 90 capsule 3  . anastrozole (ARIMIDEX) 1 MG tablet Take 1 tablet (1 mg total) by mouth daily. 90 tablet 3  . aspirin EC 81 MG tablet Take 81 mg by mouth daily.    Marland Kitchen atorvastatin (LIPITOR) 40 MG tablet Take 1 tablet (40 mg total) by mouth daily. 90 tablet 3  . Calcium 600-200 MG-UNIT per  tablet Take 1 tablet by mouth daily.    . Cholecalciferol (VITAMIN D3) 2000 UNITS TABS Take 1 tablet by mouth daily.    . cycloSPORINE (RESTASIS) 0.05 % ophthalmic emulsion Place 1 drop into both eyes 2 (two) times daily.    Marland Kitchen L-Methylfolate-Algae-B12-B6 (METANX) 3-90.314-2-35 MG CAPS Take 1 capsule by mouth 2 (two) times daily.    . Magnesium 400 MG CAPS Take 1 tablet by mouth daily.    . Multiple Vitamins-Minerals (MULTIVITAMINS THER. W/MINERALS) TABS Take 1 tablet by mouth daily.    . potassium chloride SA (K-DUR,KLOR-CON) 20 MEQ tablet Take 1 tablet (20 mEq total) by mouth daily. 90 tablet 3  . triamterene-hydrochlorothiazide (DYAZIDE) 37.5-25 MG capsule Take 1 each (1 capsule total) by mouth every morning. 90 capsule 3  . vitamin C (ASCORBIC ACID) 500 MG tablet Take 500 mg by mouth daily.     No current facility-administered medications for this visit.     PHYSICAL EXAMINATION: ECOG PERFORMANCE STATUS: 0 - Asymptomatic  Vitals:   05/11/16 1122  BP: (!) 156/63  Pulse: 64  Resp: 20  Temp: 98.4 F (36.9 C)   Filed Weights   05/11/16 1122  Weight: 212 lb 8 oz (96.4 kg)    GENERAL:alert, no distress and comfortable SKIN: skin color, texture, turgor are normal, no rashes or significant lesions EYES: normal, Conjunctiva are pink and non-injected, sclera clear OROPHARYNX:no exudate, no erythema and lips, buccal mucosa, and tongue normal  NECK: supple, thyroid normal size, non-tender, without nodularity LYMPH:  no palpable lymphadenopathy in the cervical, axillary or inguinal LUNGS: clear to auscultation and percussion with normal breathing effort HEART:  regular rate & rhythm and no murmurs and no lower extremity edema ABDOMEN:abdomen soft, non-tender and normal bowel sounds MUSCULOSKELETAL:no cyanosis of digits and no clubbing  NEURO: alert & oriented x 3 with fluent speech, no focal motor/sensory deficits EXTREMITIES: No lower extremity edema BREAST: No palpable masses or  nodules in either right or left breasts. No palpable axillary supraclavicular or infraclavicular adenopathy no breast tenderness or nipple discharge. (exam performed in the presence of a chaperone)  LABORATORY DATA:  I have reviewed the data as listed   Chemistry      Component Value Date/Time   NA 141 09/11/2015 1212   NA 145 04/18/2014 1058   K 3.7 09/11/2015 1212   K 3.2 (L) 04/18/2014 1058   CL 104 09/11/2015 1212   CL 106 04/06/2012 1128   CO2 28 09/11/2015 1212   CO2 30 (H) 04/18/2014 1058   BUN 11 09/11/2015 1212   BUN 16.4 04/18/2014 1058   CREATININE 0.68 09/11/2015 1212   CREATININE 0.8 04/18/2014 1058      Component Value Date/Time   CALCIUM 10.4 09/11/2015 1212   CALCIUM 10.4 04/18/2014 1058   ALKPHOS 86 09/11/2015 1212   ALKPHOS 115 04/18/2014 1058   AST 23 09/11/2015 1212   AST 17 04/18/2014 1058   ALT 16 09/11/2015 1212   ALT 12 04/18/2014 1058   BILITOT 1.0 09/11/2015 1212   BILITOT 1.19 04/18/2014 1058       Lab Results  Component Value Date   WBC 3.8 09/11/2015   HGB 13.0 09/11/2015   HCT 39.3 09/11/2015   MCV 94.9 09/11/2015   PLT 279 09/11/2015   NEUTROABS 2,242 09/11/2015    ASSESSMENT & PLAN:  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 Few myalgias could be related to underlying arthritis 2. Denies any vaginal bleeding I discussed with her about discontinuing antiestrogen therapy at this time. There is no data for extended antiestrogen therapy for DCIS.  Breast cancer surveillance: 1. Breast exam 05/11/16 is normal 2. Mammograms to be done 05/13/16. 3. Bone density 05/03/2013 showed a T  score -1.4 mild osteopenia  Return to clinic in 1 year for follow-up with survivorship clinic   I spent 25 minutes talking to the patient of which more than half was spent in counseling and coordination of care.  No orders of the defined types were placed in this encounter.  The patient has a good understanding of the overall plan. she agrees with it. she will call with any problems that may develop before the next visit here.   Rulon Eisenmenger, MD 05/11/16

## 2016-05-13 ENCOUNTER — Ambulatory Visit
Admission: RE | Admit: 2016-05-13 | Discharge: 2016-05-13 | Disposition: A | Discharge status: Home / Self Care | Payer: Medicare Other | Source: Ambulatory Visit | Attending: Hematology and Oncology | Admitting: Hematology and Oncology

## 2016-05-13 DIAGNOSIS — R928 Other abnormal and inconclusive findings on diagnostic imaging of breast: Secondary | ICD-10-CM | POA: Diagnosis not present

## 2016-05-13 DIAGNOSIS — Z853 Personal history of malignant neoplasm of breast: Secondary | ICD-10-CM

## 2016-05-13 HISTORY — DX: Personal history of irradiation: Z92.3

## 2016-06-15 ENCOUNTER — Telehealth: Payer: Self-pay

## 2016-06-15 NOTE — Telephone Encounter (Signed)
Advise patient that chronic potassium replacement is not indicated at present as she is taking a diuretic that prevents her from loosing potassium. Please have her schedule a follow-up with me within 1 month. I see a recent diagnosis of breast cancer and I would like to follow-up with her on hypertension.

## 2016-06-16 NOTE — Telephone Encounter (Signed)
Patient notified and states that she will not be coming in for a appointment until 09/2016.

## 2016-07-08 DIAGNOSIS — I10 Essential (primary) hypertension: Secondary | ICD-10-CM | POA: Diagnosis not present

## 2016-07-08 DIAGNOSIS — F17290 Nicotine dependence, other tobacco product, uncomplicated: Secondary | ICD-10-CM | POA: Diagnosis not present

## 2016-07-08 DIAGNOSIS — E785 Hyperlipidemia, unspecified: Secondary | ICD-10-CM | POA: Diagnosis not present

## 2016-07-08 DIAGNOSIS — E669 Obesity, unspecified: Secondary | ICD-10-CM | POA: Diagnosis not present

## 2016-07-08 DIAGNOSIS — M1990 Unspecified osteoarthritis, unspecified site: Secondary | ICD-10-CM | POA: Diagnosis not present

## 2016-07-08 DIAGNOSIS — I2510 Atherosclerotic heart disease of native coronary artery without angina pectoris: Secondary | ICD-10-CM | POA: Diagnosis not present

## 2016-07-08 DIAGNOSIS — C50919 Malignant neoplasm of unspecified site of unspecified female breast: Secondary | ICD-10-CM | POA: Diagnosis not present

## 2016-07-20 DIAGNOSIS — L602 Onychogryphosis: Secondary | ICD-10-CM | POA: Diagnosis not present

## 2016-07-29 DIAGNOSIS — I10 Essential (primary) hypertension: Secondary | ICD-10-CM | POA: Diagnosis not present

## 2016-07-29 DIAGNOSIS — E785 Hyperlipidemia, unspecified: Secondary | ICD-10-CM | POA: Diagnosis not present

## 2016-09-14 ENCOUNTER — Ambulatory Visit: Payer: Medicare Other | Admitting: Family Medicine

## 2016-10-07 DIAGNOSIS — R300 Dysuria: Secondary | ICD-10-CM | POA: Diagnosis not present

## 2016-10-07 DIAGNOSIS — R3915 Urgency of urination: Secondary | ICD-10-CM | POA: Diagnosis not present

## 2016-10-07 DIAGNOSIS — R350 Frequency of micturition: Secondary | ICD-10-CM | POA: Diagnosis not present

## 2016-10-14 DIAGNOSIS — F17290 Nicotine dependence, other tobacco product, uncomplicated: Secondary | ICD-10-CM | POA: Diagnosis not present

## 2016-10-14 DIAGNOSIS — C50919 Malignant neoplasm of unspecified site of unspecified female breast: Secondary | ICD-10-CM | POA: Diagnosis not present

## 2016-10-14 DIAGNOSIS — I10 Essential (primary) hypertension: Secondary | ICD-10-CM | POA: Diagnosis not present

## 2016-10-14 DIAGNOSIS — E669 Obesity, unspecified: Secondary | ICD-10-CM | POA: Diagnosis not present

## 2016-10-14 DIAGNOSIS — I2510 Atherosclerotic heart disease of native coronary artery without angina pectoris: Secondary | ICD-10-CM | POA: Diagnosis not present

## 2016-10-14 DIAGNOSIS — E785 Hyperlipidemia, unspecified: Secondary | ICD-10-CM | POA: Diagnosis not present

## 2016-10-14 DIAGNOSIS — M1990 Unspecified osteoarthritis, unspecified site: Secondary | ICD-10-CM | POA: Diagnosis not present

## 2016-10-20 DIAGNOSIS — N390 Urinary tract infection, site not specified: Secondary | ICD-10-CM | POA: Diagnosis not present

## 2016-10-20 DIAGNOSIS — R300 Dysuria: Secondary | ICD-10-CM | POA: Diagnosis not present

## 2016-10-20 DIAGNOSIS — N952 Postmenopausal atrophic vaginitis: Secondary | ICD-10-CM | POA: Diagnosis not present

## 2016-11-17 DIAGNOSIS — Z23 Encounter for immunization: Secondary | ICD-10-CM | POA: Diagnosis not present

## 2017-01-18 DIAGNOSIS — I8390 Asymptomatic varicose veins of unspecified lower extremity: Secondary | ICD-10-CM | POA: Diagnosis not present

## 2017-01-18 DIAGNOSIS — I10 Essential (primary) hypertension: Secondary | ICD-10-CM | POA: Diagnosis not present

## 2017-01-18 DIAGNOSIS — F17290 Nicotine dependence, other tobacco product, uncomplicated: Secondary | ICD-10-CM | POA: Diagnosis not present

## 2017-01-18 DIAGNOSIS — E785 Hyperlipidemia, unspecified: Secondary | ICD-10-CM | POA: Diagnosis not present

## 2017-01-18 DIAGNOSIS — C50919 Malignant neoplasm of unspecified site of unspecified female breast: Secondary | ICD-10-CM | POA: Diagnosis not present

## 2017-01-18 DIAGNOSIS — I2510 Atherosclerotic heart disease of native coronary artery without angina pectoris: Secondary | ICD-10-CM | POA: Diagnosis not present

## 2017-03-15 DIAGNOSIS — M7651 Patellar tendinitis, right knee: Secondary | ICD-10-CM | POA: Diagnosis not present

## 2017-03-15 DIAGNOSIS — M25461 Effusion, right knee: Secondary | ICD-10-CM | POA: Diagnosis not present

## 2017-03-15 DIAGNOSIS — Z471 Aftercare following joint replacement surgery: Secondary | ICD-10-CM | POA: Diagnosis not present

## 2017-03-15 DIAGNOSIS — M25462 Effusion, left knee: Secondary | ICD-10-CM | POA: Diagnosis not present

## 2017-03-15 DIAGNOSIS — Z96653 Presence of artificial knee joint, bilateral: Secondary | ICD-10-CM | POA: Diagnosis not present

## 2017-04-19 DIAGNOSIS — F17290 Nicotine dependence, other tobacco product, uncomplicated: Secondary | ICD-10-CM | POA: Diagnosis not present

## 2017-04-19 DIAGNOSIS — I8390 Asymptomatic varicose veins of unspecified lower extremity: Secondary | ICD-10-CM | POA: Diagnosis not present

## 2017-04-19 DIAGNOSIS — E785 Hyperlipidemia, unspecified: Secondary | ICD-10-CM | POA: Diagnosis not present

## 2017-04-19 DIAGNOSIS — M1990 Unspecified osteoarthritis, unspecified site: Secondary | ICD-10-CM | POA: Diagnosis not present

## 2017-04-19 DIAGNOSIS — I10 Essential (primary) hypertension: Secondary | ICD-10-CM | POA: Diagnosis not present

## 2017-04-19 DIAGNOSIS — I2510 Atherosclerotic heart disease of native coronary artery without angina pectoris: Secondary | ICD-10-CM | POA: Diagnosis not present

## 2017-05-10 ENCOUNTER — Other Ambulatory Visit: Payer: Self-pay | Admitting: Cardiology

## 2017-05-10 DIAGNOSIS — Z1231 Encounter for screening mammogram for malignant neoplasm of breast: Secondary | ICD-10-CM

## 2017-05-11 ENCOUNTER — Inpatient Hospital Stay: Payer: Medicare Other | Attending: Adult Health | Admitting: Adult Health

## 2017-05-11 ENCOUNTER — Telehealth: Payer: Self-pay | Admitting: Adult Health

## 2017-05-11 ENCOUNTER — Encounter: Payer: Self-pay | Admitting: Adult Health

## 2017-05-11 VITALS — BP 151/68 | HR 80 | Temp 98.2°F | Resp 18 | Ht 63.0 in | Wt 214.8 lb

## 2017-05-11 DIAGNOSIS — Z853 Personal history of malignant neoplasm of breast: Secondary | ICD-10-CM | POA: Diagnosis not present

## 2017-05-11 DIAGNOSIS — E2839 Other primary ovarian failure: Secondary | ICD-10-CM

## 2017-05-11 DIAGNOSIS — D0511 Intraductal carcinoma in situ of right breast: Secondary | ICD-10-CM

## 2017-05-11 DIAGNOSIS — D0512 Intraductal carcinoma in situ of left breast: Secondary | ICD-10-CM

## 2017-05-11 NOTE — Patient Instructions (Signed)
Bone Health Bones protect organs, store calcium, and anchor muscles. Good health habits, such as eating nutritious foods and exercising regularly, are important for maintaining healthy bones. They can also help to prevent a condition that causes bones to lose density and become weak and brittle (osteoporosis). Why is bone mass important? Bone mass refers to the amount of bone tissue that you have. The higher your bone mass, the stronger your bones. An important step toward having healthy bones throughout life is to have strong and dense bones during childhood. A young adult who has a high bone mass is more likely to have a high bone mass later in life. Bone mass at its greatest it is called peak bone mass. A large decline in bone mass occurs in older adults. In women, it occurs about the time of menopause. During this time, it is important to practice good health habits, because if more bone is lost than what is replaced, the bones will become less healthy and more likely to break (fracture). If you find that you have a low bone mass, you may be able to prevent osteoporosis or further bone loss by changing your diet and lifestyle. How can I find out if my bone mass is low? Bone mass can be measured with an X-ray test that is called a bone mineral density (BMD) test. This test is recommended for all women who are age 65 or older. It may also be recommended for men who are age 70 or older, or for people who are more likely to develop osteoporosis due to:  Having bones that break easily.  Having a long-term disease that weakens bones, such as kidney disease or rheumatoid arthritis.  Having menopause earlier than normal.  Taking medicine that weakens bones, such as steroids, thyroid hormones, or hormone treatment for breast cancer or prostate cancer.  Smoking.  Drinking three or more alcoholic drinks each day.  What are the nutritional recommendations for healthy bones? To have healthy bones, you  need to get enough of the right minerals and vitamins. Most nutrition experts recommend getting these nutrients from the foods that you eat. Nutritional recommendations vary from person to person. Ask your health care provider what is healthy for you. Here are some general guidelines. Calcium Recommendations Calcium is the most important (essential) mineral for bone health. Most people can get enough calcium from their diet, but supplements may be recommended for people who are at risk for osteoporosis. Good sources of calcium include:  Dairy products, such as low-fat or nonfat milk, cheese, and yogurt.  Dark green leafy vegetables, such as bok choy and broccoli.  Calcium-fortified foods, such as orange juice, cereal, bread, soy beverages, and tofu products.  Nuts, such as almonds.  Follow these recommended amounts for daily calcium intake:  Children, age 1?3: 700 mg.  Children, age 4?8: 1,000 mg.  Children, age 9?13: 1,300 mg.  Teens, age 14?18: 1,300 mg.  Adults, age 19?50: 1,000 mg.  Adults, age 51?70: ? Men: 1,000 mg. ? Women: 1,200 mg.  Adults, age 71 or older: 1,200 mg.  Pregnant and breastfeeding females: ? Teens: 1,300 mg. ? Adults: 1,000 mg.  Vitamin D Recommendations Vitamin D is the most essential vitamin for bone health. It helps the body to absorb calcium. Sunlight stimulates the skin to make vitamin D, so be sure to get enough sunlight. If you live in a cold climate or you do not get outside often, your health care provider may recommend that you take vitamin   D supplements. Good sources of vitamin D in your diet include:  Egg yolks.  Saltwater fish.  Milk and cereal fortified with vitamin D.  Follow these recommended amounts for daily vitamin D intake:  Children and teens, age 1?18: 600 international units.  Adults, age 50 or younger: 400-800 international units.  Adults, age 51 or older: 800-1,000 international units.  Other Nutrients Other nutrients  for bone health include:  Phosphorus. This mineral is found in meat, poultry, dairy foods, nuts, and legumes. The recommended daily intake for adult men and adult women is 700 mg.  Magnesium. This mineral is found in seeds, nuts, dark green vegetables, and legumes. The recommended daily intake for adult men is 400?420 mg. For adult women, it is 310?320 mg.  Vitamin K. This vitamin is found in green leafy vegetables. The recommended daily intake is 120 mg for adult men and 90 mg for adult women.  What type of physical activity is best for building and maintaining healthy bones? Weight-bearing and strength-building activities are important for building and maintaining peak bone mass. Weight-bearing activities cause muscles and bones to work against gravity. Strength-building activities increases muscle strength that supports bones. Weight-bearing and muscle-building activities include:  Walking and hiking.  Jogging and running.  Dancing.  Gym exercises.  Lifting weights.  Tennis and racquetball.  Climbing stairs.  Aerobics.  Adults should get at least 30 minutes of moderate physical activity on most days. Children should get at least 60 minutes of moderate physical activity on most days. Ask your health care provide what type of exercise is best for you. Where can I find more information? For more information, check out the following websites:  National Osteoporosis Foundation: http://nof.org/learn/basics  National Institutes of Health: http://www.niams.nih.gov/Health_Info/Bone/Bone_Health/bone_health_for_life.asp  This information is not intended to replace advice given to you by your health care provider. Make sure you discuss any questions you have with your health care provider. Document Released: 03/19/2003 Document Revised: 07/17/2015 Document Reviewed: 01/01/2014 Elsevier Interactive Patient Education  2018 Elsevier Inc.  

## 2017-05-11 NOTE — Telephone Encounter (Signed)
patient decide to schedule when she goes for [facility]

## 2017-05-11 NOTE — Progress Notes (Signed)
CLINIC:  Survivorship   REASON FOR VISIT:  Routine follow-up for history of breast cancer.   BRIEF ONCOLOGIC HISTORY:  Per Dr. 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  Overall, she reports feeling quite well. She is doing well since stopping Anastrozole.  She sees her PCP regularly.  She does not get much exercise.  She is working part time, she is self employed.  She works as a Electrical engineer    REVIEW OF SYSTEMS:  Review of Systems  Constitutional: Negative for appetite change, chills, fatigue, fever and unexpected weight change.  HENT:   Negative for hearing loss, lump/mass and trouble swallowing.   Eyes: Negative for eye problems and icterus.  Respiratory: Negative for chest tightness, cough and shortness of breath.   Cardiovascular: Negative for chest pain, leg swelling and palpitations.  Gastrointestinal: Negative for abdominal distention, abdominal pain, constipation, diarrhea, nausea and vomiting.  Endocrine: Negative for hot flashes.  Skin: Negative for itching and rash.  Neurological: Negative for dizziness, extremity weakness, headaches and numbness.  Hematological: Negative for adenopathy. Does not bruise/bleed easily.  Psychiatric/Behavioral: Negative for depression. The patient is not nervous/anxious.    Breast: Denies any new nodularity, masses, tenderness, nipple changes, or nipple discharge.       PAST MEDICAL/SURGICAL HISTORY:  Past Medical History:  Diagnosis Date  . Angina   . Arthritis    all over  . 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 Early cataracts, bilateral   . Hemorrhoids   . History of radiation therapy 06/07/11-07/11/11   left breast 50Gy/17fx  . Hyperlipidemia    doesn't take any meds for this  . Hypertension    takes Dyazide and Lotrel daily  . Insomnia    but doesn't take anything for this  . Joint pain   . Joint swelling   . Personal history of radiation therapy 2013  . Pneumonia    hx of in 1992  . PONV (postoperative nausea and vomiting)   . Urinary frequency    Past Surgical History:  Procedure Laterality Date  . ABDOMINAL HYSTERECTOMY  1980   partial hysterectomy  . BREAST BIOPSY Left 03/25/2011  . BREAST LUMPECTOMY Left 05/09/2011   left breast lumpectomy  . CARDIAC CATHETERIZATION  12/16/10  . CHOLECYSTECTOMY  late 45's  . COLONOSCOPY    . JOINT REPLACEMENT Right 11/2011   knee replacement  . MYOMECTOMY     early 64's  . rotator cuff surgery  1998   right  . TONSILLECTOMY     as a child     ALLERGIES:  No Known Allergies   CURRENT MEDICATIONS:  Outpatient Encounter Medications as of 05/11/2017  Medication Sig  . amLODipine-benazepril (LOTREL) 10-40 MG capsule Take 1 capsule by mouth daily.  Marland Kitchen aspirin EC 81 MG tablet Take 81 mg by mouth daily.  Marland Kitchen atorvastatin (LIPITOR) 40 MG tablet Take 1 tablet (40 mg total) by mouth daily.  . Calcium 600-200 MG-UNIT per tablet Take 1 tablet by mouth daily.  Marland Kitchen  Cholecalciferol (VITAMIN D3) 2000 UNITS TABS Take 1 tablet by mouth daily.  . cycloSPORINE (RESTASIS) 0.05 % ophthalmic emulsion Place 1 drop into both eyes 2 (two) times daily.  Marland Kitchen L-Methylfolate-Algae-B12-B6 (METANX) 3-90.314-2-35 MG CAPS Take 1 capsule by mouth 2 (two) times daily.  . Magnesium 400 MG CAPS Take 1 tablet by mouth daily.  . Multiple Vitamins-Minerals (MULTIVITAMINS THER. W/MINERALS) TABS Take 1 tablet by mouth daily.  Marland Kitchen triamterene-hydrochlorothiazide (DYAZIDE) 37.5-25 MG capsule Take 1 each (1 capsule total) by  mouth every morning.  . vitamin C (ASCORBIC ACID) 500 MG tablet Take 500 mg by mouth daily.   No facility-administered encounter medications on file as of 05/11/2017.      ONCOLOGIC FAMILY HISTORY:  Family History  Problem Relation Age of Onset  . Hypertension Mother   . Hypertension Father   . Cancer Brother        prostate  . Stroke Brother   . Cancer Cousin   . Cancer Cousin   . Malignant hyperthermia Neg Hx   . Hypotension Neg Hx   . Pseudochol deficiency Neg Hx      SOCIAL HISTORY:  Social History   Socioeconomic History  . Marital status: Single    Spouse name: Not on file  . Number of children: Not on file  . Years of education: Not on file  . Highest education level: Not on file  Occupational History  . Not on file  Social Needs  . Financial resource strain: Not on file  . Food insecurity:    Worry: Not on file    Inability: Not on file  . Transportation needs:    Medical: Not on file    Non-medical: Not on file  Tobacco Use  . Smoking status: Former Smoker    Types: Cigarettes    Last attempt to quit: 01/10/1990    Years since quitting: 27.3  . Smokeless tobacco: Never Used  . Tobacco comment: quit in 1992  Substance and Sexual Activity  . Alcohol use: Yes    Comment: wine few times a week  . Drug use: No  . Sexual activity: Not Currently    Birth control/protection: Surgical  Lifestyle  . Physical activity:    Days per week: Not on file    Minutes per session: Not on file  . Stress: Not on file  Relationships  . Social connections:    Talks on phone: Not on file    Gets together: Not on file    Attends religious service: Not on file    Active member of club or organization: Not on file    Attends meetings of clubs or organizations: Not on file    Relationship status: Not on file  . Intimate partner violence:    Fear of current or ex partner: Not on file    Emotionally abused: Not on file    Physically abused: Not on file    Forced sexual  activity: Not on file  Other Topics Concern  . Not on file  Social History Narrative  . Not on file      PHYSICAL EXAMINATION:  Vital Signs: Vitals:   05/11/17 1103  BP: (!) 151/68  Pulse: 80  Resp: 18  Temp: 98.2 F (36.8 C)  SpO2: 100%   Filed Weights   05/11/17 1103  Weight: 214 lb 12.8 oz (97.4 kg)   General: Well-nourished, well-appearing female in no acute distress.  Unaccompanied today.   HEENT: Head is normocephalic.  Pupils equal  and reactive to light. Conjunctivae clear without exudate.  Sclerae anicteric. Oral mucosa is pink, moist.  Oropharynx is pink without lesions or erythema.  Lymph: No cervical, supraclavicular, or infraclavicular lymphadenopathy noted on palpation.  Cardiovascular: Regular rate and rhythm.Marland Kitchen Respiratory: Clear to auscultation bilaterally. Chest expansion symmetric; breathing non-labored.  Breast Exam:  -Left breast: No appreciable masses on palpation. No skin redness, thickening, or peau d'orange appearance; no nipple retraction or nipple discharge; mild distortion in symmetry at previous lumpectomy site well healed scar without erythema or nodularity.  -Right breast: No appreciable masses on palpation. No skin redness, thickening, or peau d'orange appearance; no nipple retraction or nipple discharge. -Axilla: No axillary adenopathy bilaterally.  GI: Abdomen soft and round; non-tender, non-distended. Bowel sounds normoactive. No hepatosplenomegaly.   GU: Deferred.  Neuro: No focal deficits. Steady gait.  Psych: Mood and affect normal and appropriate for situation.  MSK: No focal spinal tenderness to palpation, full range of motion in bilateral upper extremities Extremities: No edema. Skin: Warm and dry.  LABORATORY DATA:  None for this visit   DIAGNOSTIC IMAGING:  Most recent mammogram:      ASSESSMENT AND PLAN:  Ms.. Diaz is a pleasant 77 y.o. female with history of Stage 0 left breast DCIS, ER+/PR+, diagnosed in 03/2011, treated  with lumpectomy, adjuvant radiation therapy, and anti-estrogen therapy x 5 years with aromatase inhibitors.  [facility] for [facility] and routine follow-up.   1. History of breast cancer:  Kara Diaz is currently clinically and radiographically without evidence of disease or recurrence of breast cancer. She will be due for mammogram in 05/2017.  She would like to be finished with her follow up at the cancer center.  I reviewed the below recommendations with her.    She will have her breasts examined by her PCP.  2. Bone health:  Given Kara Diaz's age, history of breast cancer, and her current anti-estrogen therapy with aromatase inhibitors,  she is at risk for bone demineralization. Her last DEXA scan was a few years ago, so we went ahead and ordered one for when her mammogram is due.  .  In the meantime, she was encouraged to increase her consumption of foods rich in calcium, as well as increase her weight-bearing activities.  She was given education on specific food and activities to promote bone health.  3. Cancer screening:  Due to Kara Diaz's history and her age, she should receive screening for skin cancers, colon cancer. She was encouraged to follow-up with her PCP for appropriate cancer screenings.   4. Health maintenance and wellness promotion: Kara Diaz was encouraged to consume 5-7 servings of fruits and vegetables per day. She was also encouraged to engage in moderate to vigorous exercise for 30 minutes per day most days of the week. She was instructed to limit her alcohol consumption and continue to abstain from tobacco use.     Dispo:  -Return to cancer center PRN   A total of (30) minutes of face-to-face time was spent with this patient with greater than 50% of that time in counseling and care-coordination.   Gardenia Phlegm, NP Survivorship Program [facility] [phone]   Note: PRIMARY CARE PROVIDER Charolette Forward,  [place] [phone]

## 2017-05-11 NOTE — Telephone Encounter (Signed)
Per 5/2 no los °

## 2017-05-23 ENCOUNTER — Emergency Department (HOSPITAL_COMMUNITY)
Admission: EM | Admit: 2017-05-23 | Discharge: 2017-05-23 | Discharge status: Against Medical Advice | Payer: Medicare Other

## 2017-05-24 ENCOUNTER — Encounter (HOSPITAL_COMMUNITY): Payer: Self-pay | Admitting: Emergency Medicine

## 2017-05-24 ENCOUNTER — Emergency Department (HOSPITAL_BASED_OUTPATIENT_CLINIC_OR_DEPARTMENT_OTHER): Payer: Medicare Other

## 2017-05-24 ENCOUNTER — Emergency Department (HOSPITAL_COMMUNITY)
Admission: EM | Admit: 2017-05-24 | Discharge: 2017-05-24 | Disposition: A | Discharge status: Home / Self Care | Payer: Medicare Other | Attending: Emergency Medicine | Admitting: Emergency Medicine

## 2017-05-24 DIAGNOSIS — Z87891 Personal history of nicotine dependence: Secondary | ICD-10-CM | POA: Insufficient documentation

## 2017-05-24 DIAGNOSIS — M7989 Other specified soft tissue disorders: Secondary | ICD-10-CM

## 2017-05-24 DIAGNOSIS — R2243 Localized swelling, mass and lump, lower limb, bilateral: Secondary | ICD-10-CM | POA: Diagnosis present

## 2017-05-24 DIAGNOSIS — M79661 Pain in right lower leg: Secondary | ICD-10-CM

## 2017-05-24 DIAGNOSIS — Z79899 Other long term (current) drug therapy: Secondary | ICD-10-CM | POA: Diagnosis not present

## 2017-05-24 DIAGNOSIS — I10 Essential (primary) hypertension: Secondary | ICD-10-CM | POA: Insufficient documentation

## 2017-05-24 DIAGNOSIS — M79609 Pain in unspecified limb: Secondary | ICD-10-CM

## 2017-05-24 DIAGNOSIS — Z7982 Long term (current) use of aspirin: Secondary | ICD-10-CM | POA: Diagnosis not present

## 2017-05-24 LAB — CBC
HCT: 46.1 % — ABNORMAL HIGH (ref 36.0–46.0)
HEMOGLOBIN: 15.3 g/dL — AB (ref 12.0–15.0)
MCH: 32.4 pg (ref 26.0–34.0)
MCHC: 33.2 g/dL (ref 30.0–36.0)
MCV: 97.7 fL (ref 78.0–100.0)
Platelets: 284 10*3/uL (ref 150–400)
RBC: 4.72 MIL/uL (ref 3.87–5.11)
RDW: 12.9 % (ref 11.5–15.5)
WBC: 3.6 10*3/uL — ABNORMAL LOW (ref 4.0–10.5)

## 2017-05-24 LAB — BASIC METABOLIC PANEL
Anion gap: 13 (ref 5–15)
BUN: 7 mg/dL (ref 6–20)
CALCIUM: 11.3 mg/dL — AB (ref 8.9–10.3)
CHLORIDE: 104 mmol/L (ref 101–111)
CO2: 28 mmol/L (ref 22–32)
CREATININE: 0.66 mg/dL (ref 0.44–1.00)
GFR calc Af Amer: >60 mL/min (ref >60)
GFR calc non Af Amer: >60 mL/min (ref >60)
GLUCOSE: 86 mg/dL (ref 65–99)
Potassium: 3.5 mmol/L (ref 3.5–5.1)
Sodium: 145 mmol/L (ref 135–145)

## 2017-05-24 NOTE — ED Notes (Signed)
Vascular US present to R/O DVT

## 2017-05-24 NOTE — ED Notes (Signed)
ED PA at bedside with patient. 

## 2017-05-24 NOTE — ED Provider Notes (Signed)
[facility] DEPT Provider Note   CSN: [id number] Arrival date & time: 05/24/17  4098     History   Chief Complaint Chief Complaint  Patient presents with  . Leg Swelling    HPI Kara Diaz is a 77 y.o. 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  The patient reports that a can fell out of her cupboard onto her right lower extremity 2 to 3 weeks ago.  Was initially painful, but the pain seemed to improve.  She reports she has had mild swelling to the bilateral lower extremities over the last few weeks.  She has been wearing knee-high stockings with some improvement.  She reports that several days ago she was taking a shower when she noted a sore spot in the right calf.  Over the last few days, she endorses worsening edema and pain throughout the right calf.  She denies dyspnea, chest pain, visual changes, left lower extremity pain, fever, chills, ankle or knee pain, numbness, or weakness.  She takes an aspirin daily, but no other blood thinners.  No recent immobilization, surgery, or long travel.  No estrogen use.  PCP is Dr. Terrence Dupont.  She reports that she called his office 2 days ago and he advised her to come to the emergency department for a DVT study.  No history of DVT or PE.  The history is provided by the patient. No language interpreter was used.    Past Medical History:  Diagnosis Date  . Angina   . Arthritis    all over  . Breast cancer ([place]) 03/2011   high grade ductal carcinoma in situ left breast  . Early cataracts, bilateral   . Hemorrhoids   . History of radiation therapy 06/07/11-07/11/11   left breast 50Gy/41fx  . Hyperlipidemia    doesn't take any meds for this  . Hypertension    takes  Dyazide and Lotrel daily  . Insomnia    but doesn't take anything for this  . Joint pain   . Joint swelling   . Personal history of radiation therapy 2013  . Pneumonia    hx of in 1992  . PONV (postoperative nausea and vomiting)   . Urinary frequency     Patient Active Problem List   Diagnosis Date Noted  . Left hip pain 05/29/2014  . Medication side effect 09/05/2013  . Vitamin D deficiency 04/25/2013  . Visit for annual health examination 04/25/2013  . Hyperlipidemia with target LDL less than 100 04/25/2013  . Hypokalemia 11/23/2012  . Immunization due 11/23/2012  . Essential hypertension, benign 08/21/2012  . Acute blood loss anemia 08/21/2012  . Osteoarthritis of left knee S/P left total knee arthroplasty 08/21/2012  . Long term (current) use of anticoagulants 08/21/2012  . History of breast cancer 04/13/2012  . Mild obesity 03/09/2012  . 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 ABDOMINAL HYSTERECTOMY  1980   partial hysterectomy  . BREAST BIOPSY Left 03/25/2011  . BREAST LUMPECTOMY Left 05/09/2011   left breast lumpectomy  . CARDIAC CATHETERIZATION  12/16/10  . CHOLECYSTECTOMY  late 49's  . COLONOSCOPY    .  JOINT REPLACEMENT Right 11/2011   knee replacement  . MYOMECTOMY     early 71's  . rotator cuff surgery  1998   right  . TONSILLECTOMY     as a child     OB History   None      Home Medications    Prior to Admission medications   Medication Sig Start Date End Date Taking? Authorizing Provider  amLODipine-benazepril (LOTREL) 10-40 MG capsule Take 1 capsule by mouth daily. 12/15/15  Yes Micheline Chapman, NP  aspirin EC 81 MG tablet Take 81 mg by mouth daily.   Yes [provider]  atorvastatin (LIPITOR) 40 MG tablet Take 1 tablet (40 mg total) by mouth daily. 03/14/16  Yes Micheline Chapman, NP  Calcium 600-200 MG-UNIT per  tablet Take 1 tablet by mouth daily.   Yes [provider]  Cholecalciferol (VITAMIN D3) 2000 UNITS TABS Take 1 tablet by mouth daily.   Yes [provider]  cycloSPORINE (RESTASIS) 0.05 % ophthalmic emulsion Place 1 drop into both eyes 2 (two) times daily.   Yes [provider]  L-Methylfolate-Algae-B12-B6 Glade Stanford) 3-90.314-2-35 MG CAPS Take 1 capsule by mouth 2 (two) times daily.   Yes [provider]  Magnesium 400 MG CAPS Take 1 tablet by mouth daily.   Yes [provider]  Multiple Vitamins-Minerals (MULTIVITAMINS THER. W/MINERALS) TABS Take 1 tablet by mouth daily.   Yes [provider]  triamterene-hydrochlorothiazide (DYAZIDE) 37.5-25 MG capsule Take 1 each (1 capsule total) by mouth every morning. 12/15/15  Yes Micheline Chapman, NP  vitamin C (ASCORBIC ACID) 500 MG tablet Take 500 mg by mouth daily.   Yes [provider]    Family History Family History  Problem Relation Age of Onset  . Hypertension Mother   . Hypertension Father   . Cancer Brother        prostate  . Stroke Brother   . Cancer Cousin   . Cancer Cousin   . Malignant hyperthermia Neg Hx   . Hypotension Neg Hx   . Pseudochol deficiency Neg Hx     Social History Social History   Tobacco Use  . Smoking status: Former Smoker    Types: Cigarettes    Last attempt to quit: 01/10/1990    Years since quitting: 27.3  . Smokeless tobacco: Never Used  . Tobacco comment: quit in 1992  Substance Use Topics  . Alcohol use: Yes    Comment: wine few times a week  . Drug use: No     Allergies   Patient has no known allergies.   Review of Systems Review of Systems  Constitutional: Negative for activity change, chills and fever.  Respiratory: Negative for cough, shortness of breath and wheezing.   Cardiovascular: Positive for leg swelling. Negative for chest pain and palpitations.  Gastrointestinal: Negative for abdominal pain, diarrhea, nausea and  vomiting.  Genitourinary: Negative for dysuria.  Musculoskeletal: Positive for myalgias. Negative for arthralgias, back pain, gait problem and joint swelling.  Skin: Negative for color change and rash.  Allergic/Immunologic: Negative for immunocompromised state.  Neurological: Negative for weakness, numbness and headaches.  Psychiatric/Behavioral: Negative for confusion.   Physical Exam Updated Vital Signs BP (!) 175/91 (BP Location: Right Arm)   Pulse 81   Temp 98 F (36.7 C) (Oral)   Resp 16   SpO2 100%   Physical Exam  Constitutional: No distress.  HENT:  Head: Normocephalic.  Eyes: Conjunctivae are normal.  Neck: Neck supple.  Cardiovascular: Normal rate, regular rhythm, normal heart sounds and intact distal pulses. Exam reveals no gallop and no friction rub.  No murmur heard. Pulmonary/Chest: Effort normal and breath sounds normal. No stridor. No respiratory distress. She has no wheezes. She has no rales. She exhibits no tenderness.  Clear and equal breath sounds bilaterally.  Abdominal: Soft. She exhibits no distension and no mass. There is no tenderness. There is no rebound and no guarding. No hernia.  Musculoskeletal: She exhibits edema and tenderness. She exhibits no deformity.  Minimal pedal edema to the left lower extremity. Moderate edema to the RLE, at least 1/3 larger than the LLE.  She is exquisitely tender with palpation to the right calf. + Homan sign.  No bony tenderness to the bilateral lower extremities. No overlying erythema or warmth.  DP and PT pulses are 2+ and symmetric.  Sensation is intact and equal throughout the bilateral lower extremities.  5 out of 5 strength against resistance with dorsiflexion plantar flexion.  Well-healed vertical scars of the bilateral knees.  No tenderness to palpation to the bilateral ankles, knees, or hips.  Bilateral thighs are nontender to palpation.  Neurological: She is alert.  Skin: Skin is warm. No rash noted.    Psychiatric: Her behavior is normal.  Nursing note and vitals reviewed.    ED Treatments / Results  Labs (all labs ordered are listed, but only abnormal results are displayed) Labs Reviewed  BASIC METABOLIC PANEL - Abnormal; Notable for the following components:      Result Value   Calcium 11.3 (*)    All other components within normal limits  CBC - Abnormal; Notable for the following components:   WBC 3.6 (*)    Hemoglobin 15.3 (*)    HCT 46.1 (*)    All other components within normal limits    EKG None  Radiology No results found.  Procedures Procedures (including critical care time)  Medications Ordered in ED Medications - No data to display   Initial Impression / Assessment and Plan / ED Course  I have reviewed the triage vital signs and the nursing notes.  Pertinent labs & imaging results that were available during my care of the patient were reviewed by me and considered in my medical decision making (see chart for details).     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  The patient was seen and evaluated by Dr. Sherry Ruffing, attending physician.  No fever, chills, dyspnea, or chest pain.  She is hypertensive, but otherwise hemodynamically stable without fever or tachycardia.  SaO2 100% on room air. Labs are notable for hypercalcemia of 11.3.[facility] US is negative.   The patient was seen and evaluated by Dr. Sherry Ruffing, attending physician.  Discussed hypercalcemia and elevated blood pressure with the patient prior to discharge.  Recommended that she follow-up with Dr. Terrence Dupont, PCP, for recheck.  Recommended symptomatic treatment for her right calf pain.  Strict return precautions given.  She has no signs or symptoms of hypertensive urgency or emergency.  She is hemodynamically  stable and in no acute distress.  She is safe for discharge home with outpatient follow-up at this time.  Final Clinical Impressions(s) / ED Diagnoses   Final diagnoses:  Pain of right calf    ED Discharge Orders    None  Joline Maxcy A, PA-C 05/24/17 1027    Tegeler, Gwenyth Allegra, MD 05/25/17 [phone]

## 2017-05-24 NOTE — ED Notes (Signed)
Pt with swelling rt lower leg, + Homans sign. Pt ambulatory but is having increased pain, especially with palpation.

## 2017-05-24 NOTE — Discharge Instructions (Signed)
Thank you for allowing me to care for your today in the Emergency Department.   Your calcium was elevated today at 11.3.  Please follow-up with Dr. Terrence Dupont to have this rechecked in the next week.  Your blood pressure was elevated when he got here today.  Please make sure that he rechecks this during her next visit.  Take at thousand milligrams of Tylenol every 8 hours for pain.  There also several creams that are available over-the-counter, such as lidocaine or menthol creams, that can help with muscle pain.  Apply ice for 15 to 20 minutes up to 3-4 times a day to help with pain and swelling.  When you are sitting and resting, elevate your right leg above the level of your heart to help with swelling.  Return to the emergency department if you develop new or worsening symptoms such as if the leg becomes hot to the touch, red, if you develop fever, chills, chest pain, or shortness of breath, or other new concerning symptoms.

## 2017-05-24 NOTE — ED Triage Notes (Signed)
Pt reports that has right lower leg swelling where she dropped cans on her leg 2-3 weeks ago. Reports is painful when palpated.

## 2017-05-24 NOTE — Progress Notes (Addendum)
Preliminary notes --Right lower extremity venous duplex exam completed. Prominent lymph nodes noted at groin area. Negative for DVT. Result notified RN Patty.   Hongying Jhamir Pickup (RDMS RVT) 05/24/17 9:18 AM

## 2017-06-08 ENCOUNTER — Ambulatory Visit
Admission: RE | Admit: 2017-06-08 | Discharge: 2017-06-08 | Disposition: A | Discharge status: Home / Self Care | Payer: Medicare Other | Source: Ambulatory Visit | Attending: Cardiology | Admitting: Cardiology

## 2017-06-08 DIAGNOSIS — Z1231 Encounter for screening mammogram for malignant neoplasm of breast: Secondary | ICD-10-CM

## 2017-06-12 DIAGNOSIS — M79604 Pain in right leg: Secondary | ICD-10-CM | POA: Diagnosis not present

## 2017-06-12 DIAGNOSIS — M79605 Pain in left leg: Secondary | ICD-10-CM | POA: Diagnosis not present

## 2017-06-12 DIAGNOSIS — R600 Localized edema: Secondary | ICD-10-CM | POA: Diagnosis not present

## 2017-06-12 DIAGNOSIS — I872 Venous insufficiency (chronic) (peripheral): Secondary | ICD-10-CM | POA: Diagnosis not present

## 2017-06-19 ENCOUNTER — Other Ambulatory Visit: Payer: Medicare Other

## 2017-06-21 ENCOUNTER — Emergency Department (HOSPITAL_BASED_OUTPATIENT_CLINIC_OR_DEPARTMENT_OTHER)
Admit: 2017-06-21 | Discharge: 2017-06-21 | Disposition: A | Discharge status: Home / Self Care | Payer: Medicare Other | Attending: Emergency Medicine | Admitting: Emergency Medicine

## 2017-06-21 ENCOUNTER — Emergency Department (HOSPITAL_COMMUNITY): Payer: Medicare Other

## 2017-06-21 ENCOUNTER — Emergency Department (HOSPITAL_COMMUNITY)
Admission: EM | Admit: 2017-06-21 | Discharge: 2017-06-21 | Disposition: A | Discharge status: Home / Self Care | Payer: Medicare Other | Attending: Emergency Medicine | Admitting: Emergency Medicine

## 2017-06-21 ENCOUNTER — Encounter (HOSPITAL_COMMUNITY): Payer: Self-pay | Admitting: Emergency Medicine

## 2017-06-21 DIAGNOSIS — I10 Essential (primary) hypertension: Secondary | ICD-10-CM | POA: Insufficient documentation

## 2017-06-21 DIAGNOSIS — Z7982 Long term (current) use of aspirin: Secondary | ICD-10-CM | POA: Diagnosis not present

## 2017-06-21 DIAGNOSIS — Z96651 Presence of right artificial knee joint: Secondary | ICD-10-CM | POA: Diagnosis not present

## 2017-06-21 DIAGNOSIS — L03115 Cellulitis of right lower limb: Secondary | ICD-10-CM | POA: Diagnosis not present

## 2017-06-21 DIAGNOSIS — M79609 Pain in unspecified limb: Secondary | ICD-10-CM

## 2017-06-21 DIAGNOSIS — Z853 Personal history of malignant neoplasm of breast: Secondary | ICD-10-CM | POA: Diagnosis not present

## 2017-06-21 DIAGNOSIS — Z79899 Other long term (current) drug therapy: Secondary | ICD-10-CM | POA: Insufficient documentation

## 2017-06-21 DIAGNOSIS — Z87891 Personal history of nicotine dependence: Secondary | ICD-10-CM | POA: Diagnosis not present

## 2017-06-21 DIAGNOSIS — M7989 Other specified soft tissue disorders: Secondary | ICD-10-CM | POA: Diagnosis not present

## 2017-06-21 DIAGNOSIS — R2241 Localized swelling, mass and lump, right lower limb: Secondary | ICD-10-CM | POA: Diagnosis present

## 2017-06-21 DIAGNOSIS — M79661 Pain in right lower leg: Secondary | ICD-10-CM | POA: Diagnosis not present

## 2017-06-21 MED ORDER — HYDROCODONE-ACETAMINOPHEN 5-325 MG PO TABS
1.0000 | ORAL_TABLET | Freq: Once | ORAL | Status: AC
  Administered 2017-06-21: 1 via ORAL
  Filled 2017-06-21: qty 1

## 2017-06-21 MED ORDER — CEPHALEXIN 500 MG PO CAPS: 1000.0000 mg | ORAL_CAPSULE | Freq: Two times a day (BID) | ORAL | 0 refills | Status: DC

## 2017-06-21 MED ORDER — HYDROCODONE-ACETAMINOPHEN 5-325 MG PO TABS: 1.0000 | ORAL_TABLET | ORAL | 0 refills | Status: DC | PRN

## 2017-06-21 NOTE — ED Triage Notes (Signed)
Pt c/o swelling in calf area for 4-5 weeks. Was told to come to ED to rule out blood clot.

## 2017-06-21 NOTE — ED Provider Notes (Signed)
[facility] DEPT Provider Note   CSN: [id number] Arrival date & time: 06/21/17  0810     History   Chief Complaint Chief Complaint  Patient presents with  . Leg Swelling    HPI Kara Diaz is a 77 y.o. female.  HPI Patient has pain and swelling in the right lower leg for about 4 to 5 weeks.  She reports that it has gotten worse and is very painful particularly behind the leg and to the medial aspect.  It has gotten swollen and red.  She reports she has been trying some topical treatments over-the-counter from the pharmacy.  Some have ven some relief but the symptoms persist.  Pagiin is aching and throbbing in quality.  No associated foot pain.  Patient denies chest pain or shortness of breath.  No fever no chills. Past Medical History:  Diagnosis Date  . Angina   . Arthritis    all over  . Breast cancer ([place]) 03/2011   high grade ductal carcinoma in situ left breast  . Early cataracts, bilateral   . Hemorrhoids   . History of radiation therapy 06/07/11-07/11/11   left breast 50Gy/43fx  . Hyperlipidemia    doesn't take any meds for this  . Hypertension    takes Dyazide and Lotrel daily  . Insomnia    but doesn't take anything for this  . Joint pain   . Joint swelling   . Personal history of radiation therapy 2013  . Pneumonia    hx of in 1992  . PONV (postoperative nausea and vomiting)   . Urinary frequency     Patient Active Problem List   Diagnosis Date Noted  . Left hip pain 05/29/2014  . Medication side effect 09/05/2013  . Vitamin D deficiency 04/25/2013  . Visit for annual health examination 04/25/2013  . Hyperlipidemia with target LDL less than 100 04/25/2013  . Hypokalemia 11/23/2012  . Immunization due 11/23/2012  . Essential hypertension, benign 08/21/2012  . Acute blood loss anemia 08/21/2012  . Osteoarthritis of left knee S/P left total knee arthroplasty 08/21/2012  . Long term (current) use of anticoagulants  08/21/2012  . History of breast cancer 04/13/2012  . Mild obesity 03/09/2012  . 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 ABDOMINAL HYSTERECTOMY  1980   partial hysterectomy  . BREAST BIOPSY Left 03/25/2011  . BREAST LUMPECTOMY Left 05/09/2011   left breast lumpectomy  . CARDIAC CATHETERIZATION  12/16/10  . CHOLECYSTECTOMY  late 29's  . COLONOSCOPY    . JOINT REPLACEMENT Right 11/2011   knee replacement  . MYOMECTOMY     early 67's  . rotator cuff surgery  1998   right  . TONSILLECTOMY     as a child     OB History   None      Home Medications    Prior to Admission medications   Medication Sig Start Date End Date Taking? Authorizing Provider  amLODipine-benazepril (LOTREL) 10-40 MG capsule Take 1 capsule by mouth daily. 12/15/15  Yes Micheline Chapman, NP  aspirin EC 81 MG tablet Take 81 mg by mouth daily.   Yes [provider]  atorvastatin (LIPITOR) 40 MG tablet Take 1 tablet (40 mg total) by mouth daily. 03/14/16  Yes Micheline Chapman, NP  Calcium 600-200 MG-UNIT per tablet Take 1 tablet by mouth daily.  Yes [provider]  Cholecalciferol (VITAMIN D3) 2000 UNITS TABS Take 1 tablet by mouth daily.   Yes [provider]  cycloSPORINE (RESTASIS) 0.05 % ophthalmic emulsion Place 1 drop into both eyes 2 (two) times daily.   Yes [provider]  L-Methylfolate-Algae-B12-B6 Glade Stanford) 3-90.314-2-35 MG CAPS Take 1 capsule by mouth 2 (two) times daily.   Yes [provider]  Magnesium 400 MG CAPS Take 1 tablet by mouth daily.   Yes [provider]  Multiple Vitamins-Minerals (MULTIVITAMINS THER. W/MINERALS) TABS Take 1 tablet by mouth daily.   Yes [provider]  potassium chloride SA (K-DUR,KLOR-CON) 20 MEQ tablet Take 20 mEq by mouth daily.   Yes [provider]    triamterene-hydrochlorothiazide (DYAZIDE) 37.5-25 MG capsule Take 1 each (1 capsule total) by mouth every morning. 12/15/15  Yes Micheline Chapman, NP  cephALEXin (KEFLEX) 500 MG capsule Take 2 capsules (1,000 mg total) by mouth 2 (two) times daily. 06/21/17   Charlesetta Shanks, MD  HYDROcodone-acetaminophen (NORCO/VICODIN) 5-325 MG tablet Take 1-2 tablets by mouth every 4 (four) hours as needed for moderate pain or severe pain. 06/21/17   Charlesetta Shanks, MD    Family History Family History  Problem Relation Age of Onset  . Hypertension Mother   . Hypertension Father   . Cancer Brother        prostate  . Stroke Brother   . Cancer Cousin   . Cancer Cousin   . Malignant hyperthermia Neg Hx   . Hypotension Neg Hx   . Pseudochol deficiency Neg Hx     Social History Social History   Tobacco Use  . Smoking status: Former Smoker    Types: Cigarettes    Last attempt to quit: 01/10/1990    Years since quitting: 27.4  . Smokeless tobacco: Never Used  . Tobacco comment: quit in 1992  Substance Use Topics  . Alcohol use: Yes    Comment: wine few times a week  . Drug use: No     Allergies   Patient has no known allergies.   Review of Systems Review of Systems 10 Systems reviewed and are negative for acute change except as noted in the HPI.   Physical Exam Updated Vital Signs BP (!) 162/77   Pulse 78   Temp 97.6 F (36.4 C) (Oral)   Resp 16   SpO2 100%   Physical Exam  Constitutional: She is oriented to person, place, and time. She appears well-developed and well-nourished.  HENT:  Head: Normocephalic and atraumatic.  Eyes: EOM are normal.  Neck: Neck supple.  Cardiovascular: Normal rate, regular rhythm, normal heart sounds and intact distal pulses.  Occasional ectopic beat.  Pulmonary/Chest: Effort normal and breath sounds normal.  Abdominal: Soft. Bowel sounds are normal. She exhibits no distension. There is no tenderness.  Musculoskeletal: Normal range of motion.  She exhibits edema and tenderness.  Patient has tenderness to the right lower extremity to the medial and posterior aspect of the lower leg.  There is pitting edema 2+ in this area.  Diffuse erythema.  No palpation tenderness over the Achilles or the heel.  Knee has well-healed old surgical scar.  No effusion erythema or tenderness of the knee.  Cells pedis pulse are 2+ and symmetric.  Neurological: She is alert and oriented to person, place, and time. She has normal strength. Coordination normal. GCS eye subscore is 4. GCS verbal subscore is 5. GCS motor subscore is 6.  Skin: Skin is warm, dry  and intact.  Psychiatric: She has a normal mood and affect.         ED Treatments / Results  Labs (all labs ordered are listed, but only abnormal results are displayed) Labs Reviewed - No data to display  EKG None  Radiology Dg Tibia/fibula Right  Result Date: 06/21/2017 CLINICAL DATA:  Swelling and pain in the right lower leg for 1 month. EXAM: RIGHT TIBIA AND FIBULA - 2 VIEW COMPARISON:  None. FINDINGS: There is no evidence of fracture or other focal bone lesions. Right total knee prosthesis in place with no evidence of loosening. Soft tissues appear normal. IMPRESSION: No significant abnormality. Electronically Signed   By: Lorriane Shire M.D.   On: 06/21/2017 10:53    Procedures Procedures (including critical care time)  Medications Ordered in ED Medications  HYDROcodone-acetaminophen (NORCO/VICODIN) 5-325 MG per tablet 1 tablet (1 tablet Oral Given 06/21/17 1048)     Initial Impression / Assessment and Plan / ED Course  I have reviewed the triage vital signs and the nursing notes.  Pertinent labs & imaging results that were available during my care of the patient were reviewed by me and considered in my medical decision making (see chart for details).     Final Clinical Impressions(s) / ED Diagnoses   Final diagnoses:  Cellulitis of right lower extremity   Patient has had  ongoing redness and swelling of her right lower leg.  She has edema and erythema objectively on exam.  She had DVT study 4 weeks ago and now repeated today.  No evidence of occult DVT.  X-ray does not show any underlying bony or soft tissue abnormalities.  Patient is otherwise clinically well.  Has normal pedal pulses.  No signs of arterial insufficiency by clinical exam.  At this time will trial Keflex for suspected cellulitis.  And has follow-up instructions within the next 2 to 4 days and return instructions reviewed. ED Discharge Orders        Ordered    cephALEXin (KEFLEX) 500 MG capsule  2 times daily     06/21/17 1145    HYDROcodone-acetaminophen (NORCO/VICODIN) 5-325 MG tablet  Every 4 hours PRN     06/21/17 1145       Charlesetta Shanks, MD 06/21/17 1200

## 2017-06-21 NOTE — Progress Notes (Signed)
RLE venous duplex prelim: negative for DVT in visualized veins. Landry Mellow, RDMS, RVT  Unchanged from study on 05/24/17

## 2017-07-05 DIAGNOSIS — F17290 Nicotine dependence, other tobacco product, uncomplicated: Secondary | ICD-10-CM | POA: Diagnosis not present

## 2017-07-05 DIAGNOSIS — I8390 Asymptomatic varicose veins of unspecified lower extremity: Secondary | ICD-10-CM | POA: Diagnosis not present

## 2017-07-05 DIAGNOSIS — E785 Hyperlipidemia, unspecified: Secondary | ICD-10-CM | POA: Diagnosis not present

## 2017-07-05 DIAGNOSIS — I10 Essential (primary) hypertension: Secondary | ICD-10-CM | POA: Diagnosis not present

## 2017-07-05 DIAGNOSIS — M1990 Unspecified osteoarthritis, unspecified site: Secondary | ICD-10-CM | POA: Diagnosis not present

## 2017-07-05 DIAGNOSIS — L03115 Cellulitis of right lower limb: Secondary | ICD-10-CM | POA: Diagnosis not present

## 2017-07-05 DIAGNOSIS — I2510 Atherosclerotic heart disease of native coronary artery without angina pectoris: Secondary | ICD-10-CM | POA: Diagnosis not present

## 2017-07-21 DIAGNOSIS — I10 Essential (primary) hypertension: Secondary | ICD-10-CM | POA: Diagnosis not present

## 2017-07-21 DIAGNOSIS — R609 Edema, unspecified: Secondary | ICD-10-CM | POA: Diagnosis not present

## 2017-07-21 DIAGNOSIS — I2510 Atherosclerotic heart disease of native coronary artery without angina pectoris: Secondary | ICD-10-CM | POA: Diagnosis not present

## 2017-07-21 DIAGNOSIS — F17290 Nicotine dependence, other tobacco product, uncomplicated: Secondary | ICD-10-CM | POA: Diagnosis not present

## 2017-07-21 DIAGNOSIS — M1990 Unspecified osteoarthritis, unspecified site: Secondary | ICD-10-CM | POA: Diagnosis not present

## 2017-07-21 DIAGNOSIS — E785 Hyperlipidemia, unspecified: Secondary | ICD-10-CM | POA: Diagnosis not present

## 2017-07-26 DIAGNOSIS — H5213 Myopia, bilateral: Secondary | ICD-10-CM | POA: Diagnosis not present

## 2017-07-26 DIAGNOSIS — H04123 Dry eye syndrome of bilateral lacrimal glands: Secondary | ICD-10-CM | POA: Diagnosis not present

## 2017-07-26 DIAGNOSIS — H2513 Age-related nuclear cataract, bilateral: Secondary | ICD-10-CM | POA: Diagnosis not present

## 2017-07-26 DIAGNOSIS — H52203 Unspecified astigmatism, bilateral: Secondary | ICD-10-CM | POA: Diagnosis not present

## 2017-07-26 DIAGNOSIS — H524 Presbyopia: Secondary | ICD-10-CM | POA: Diagnosis not present

## 2017-07-27 ENCOUNTER — Ambulatory Visit
Admission: RE | Admit: 2017-07-27 | Discharge: 2017-07-27 | Disposition: A | Discharge status: Home / Self Care | Payer: Medicare Other | Source: Ambulatory Visit | Attending: Adult Health | Admitting: Adult Health

## 2017-07-27 ENCOUNTER — Telehealth: Payer: Self-pay

## 2017-07-27 ENCOUNTER — Other Ambulatory Visit: Payer: Medicare Other

## 2017-07-27 DIAGNOSIS — E785 Hyperlipidemia, unspecified: Secondary | ICD-10-CM | POA: Diagnosis not present

## 2017-07-27 DIAGNOSIS — M8589 Other specified disorders of bone density and structure, multiple sites: Secondary | ICD-10-CM | POA: Diagnosis not present

## 2017-07-27 DIAGNOSIS — E2839 Other primary ovarian failure: Secondary | ICD-10-CM

## 2017-07-27 DIAGNOSIS — Z780 Asymptomatic menopausal state: Secondary | ICD-10-CM | POA: Diagnosis not present

## 2017-07-27 DIAGNOSIS — I10 Essential (primary) hypertension: Secondary | ICD-10-CM | POA: Diagnosis not present

## 2017-07-27 NOTE — Telephone Encounter (Signed)
Several attempts made to reach pt without answer.  LVM informing of BD results and recommendations and center call back number for questions.

## 2017-07-27 NOTE — Telephone Encounter (Signed)
-----   Message from Gardenia Phlegm, NP sent at 07/27/2017  2:18 PM EDT ----- Please let patient know that bone density test shows osteopenia.  Recommend calcium, vitamin d and weight bearing exercise. ----- Message ----- From: Interface, Rad Results In Sent: 07/27/2017   1:36 PM To: Gardenia Phlegm, NP

## 2017-07-28 ENCOUNTER — Other Ambulatory Visit: Payer: Medicare Other

## 2017-08-01 ENCOUNTER — Telehealth: Payer: Self-pay

## 2017-08-01 NOTE — Telephone Encounter (Signed)
Outgoing call to patient - she wanted to know about her bone density test.  No answer, left her VM with our contact info.

## 2017-08-02 ENCOUNTER — Telehealth: Payer: Self-pay

## 2017-08-02 NOTE — Telephone Encounter (Signed)
Returned patient's call regarding bone density test results.  Informed patient of results and recommendations.  Patient voiced understanding to continue Calcium and Vitamin D, and incorporate weight bearing exercises such as walking per Kara Bihari, NP recommendations.  No further needs at this time.

## 2017-08-25 DIAGNOSIS — M1990 Unspecified osteoarthritis, unspecified site: Secondary | ICD-10-CM | POA: Diagnosis not present

## 2017-08-25 DIAGNOSIS — I10 Essential (primary) hypertension: Secondary | ICD-10-CM | POA: Diagnosis not present

## 2017-08-25 DIAGNOSIS — E785 Hyperlipidemia, unspecified: Secondary | ICD-10-CM | POA: Diagnosis not present

## 2017-08-25 DIAGNOSIS — I2510 Atherosclerotic heart disease of native coronary artery without angina pectoris: Secondary | ICD-10-CM | POA: Diagnosis not present

## 2017-08-25 DIAGNOSIS — F17290 Nicotine dependence, other tobacco product, uncomplicated: Secondary | ICD-10-CM | POA: Diagnosis not present

## 2017-10-14 DIAGNOSIS — Z23 Encounter for immunization: Secondary | ICD-10-CM | POA: Diagnosis not present

## 2017-11-24 DIAGNOSIS — L603 Nail dystrophy: Secondary | ICD-10-CM | POA: Diagnosis not present

## 2017-11-24 DIAGNOSIS — M79671 Pain in right foot: Secondary | ICD-10-CM | POA: Diagnosis not present

## 2017-11-24 DIAGNOSIS — M79672 Pain in left foot: Secondary | ICD-10-CM | POA: Diagnosis not present

## 2017-11-24 DIAGNOSIS — M2042 Other hammer toe(s) (acquired), left foot: Secondary | ICD-10-CM | POA: Diagnosis not present

## 2017-11-24 DIAGNOSIS — M2041 Other hammer toe(s) (acquired), right foot: Secondary | ICD-10-CM | POA: Diagnosis not present

## 2017-12-01 DIAGNOSIS — I2510 Atherosclerotic heart disease of native coronary artery without angina pectoris: Secondary | ICD-10-CM | POA: Diagnosis not present

## 2017-12-01 DIAGNOSIS — E785 Hyperlipidemia, unspecified: Secondary | ICD-10-CM | POA: Diagnosis not present

## 2017-12-01 DIAGNOSIS — M1990 Unspecified osteoarthritis, unspecified site: Secondary | ICD-10-CM | POA: Diagnosis not present

## 2017-12-01 DIAGNOSIS — I10 Essential (primary) hypertension: Secondary | ICD-10-CM | POA: Diagnosis not present

## 2017-12-01 DIAGNOSIS — E669 Obesity, unspecified: Secondary | ICD-10-CM | POA: Diagnosis not present

## 2017-12-01 DIAGNOSIS — F17290 Nicotine dependence, other tobacco product, uncomplicated: Secondary | ICD-10-CM | POA: Diagnosis not present

## 2017-12-05 DIAGNOSIS — L603 Nail dystrophy: Secondary | ICD-10-CM | POA: Diagnosis not present

## 2018-10-31 ENCOUNTER — Encounter (HOSPITAL_COMMUNITY): Payer: Self-pay

## 2018-10-31 ENCOUNTER — Other Ambulatory Visit: Payer: Self-pay

## 2018-10-31 ENCOUNTER — Emergency Department (HOSPITAL_COMMUNITY)
Admission: EM | Admit: 2018-10-31 | Discharge: 2018-10-31 | Disposition: A | Discharge status: Home / Self Care | Payer: Medicare Other | Attending: Emergency Medicine | Admitting: Emergency Medicine

## 2018-10-31 ENCOUNTER — Emergency Department (HOSPITAL_COMMUNITY): Payer: Medicare Other

## 2018-10-31 DIAGNOSIS — Y929 Unspecified place or not applicable: Secondary | ICD-10-CM | POA: Insufficient documentation

## 2018-10-31 DIAGNOSIS — Z96651 Presence of right artificial knee joint: Secondary | ICD-10-CM | POA: Insufficient documentation

## 2018-10-31 DIAGNOSIS — Z79899 Other long term (current) drug therapy: Secondary | ICD-10-CM | POA: Diagnosis not present

## 2018-10-31 DIAGNOSIS — W010XXA Fall on same level from slipping, tripping and stumbling without subsequent striking against object, initial encounter: Secondary | ICD-10-CM | POA: Insufficient documentation

## 2018-10-31 DIAGNOSIS — Y999 Unspecified external cause status: Secondary | ICD-10-CM | POA: Diagnosis not present

## 2018-10-31 DIAGNOSIS — Z853 Personal history of malignant neoplasm of breast: Secondary | ICD-10-CM | POA: Diagnosis not present

## 2018-10-31 DIAGNOSIS — Z7982 Long term (current) use of aspirin: Secondary | ICD-10-CM | POA: Diagnosis not present

## 2018-10-31 DIAGNOSIS — Z87891 Personal history of nicotine dependence: Secondary | ICD-10-CM | POA: Diagnosis not present

## 2018-10-31 DIAGNOSIS — S79912A Unspecified injury of left hip, initial encounter: Secondary | ICD-10-CM | POA: Diagnosis present

## 2018-10-31 DIAGNOSIS — Y9301 Activity, walking, marching and hiking: Secondary | ICD-10-CM | POA: Insufficient documentation

## 2018-10-31 DIAGNOSIS — S7002XA Contusion of left hip, initial encounter: Secondary | ICD-10-CM | POA: Diagnosis not present

## 2018-10-31 LAB — CBC
HCT: 40.2 % (ref 36.0–46.0)
Hemoglobin: 12.9 g/dL (ref 12.0–15.0)
MCH: 32.1 pg (ref 26.0–34.0)
MCHC: 32.1 g/dL (ref 30.0–36.0)
MCV: 100 fL (ref 80.0–100.0)
Platelets: 303 10*3/uL (ref 150–400)
RBC: 4.02 MIL/uL (ref 3.87–5.11)
RDW: 12.6 % (ref 11.5–15.5)
WBC: 4.3 10*3/uL (ref 4.0–10.5)
nRBC: 0 % (ref 0.0–0.2)

## 2018-10-31 LAB — BASIC METABOLIC PANEL
Anion gap: 8 (ref 5–15)
BUN: 21 mg/dL (ref 8–23)
CO2: 30 mmol/L (ref 22–32)
Calcium: 10.2 mg/dL (ref 8.9–10.3)
Chloride: 101 mmol/L (ref 98–111)
Creatinine, Ser: 0.71 mg/dL (ref 0.44–1.00)
GFR calc Af Amer: >60 mL/min (ref >60)
GFR calc non Af Amer: >60 mL/min (ref >60)
Glucose, Bld: 96 mg/dL (ref 70–99)
Potassium: 3 mmol/L — ABNORMAL LOW (ref 3.5–5.1)
Sodium: 139 mmol/L (ref 135–145)

## 2018-10-31 MED ORDER — POTASSIUM CHLORIDE CRYS ER 20 MEQ PO TBCR
40.0000 meq | EXTENDED_RELEASE_TABLET | Freq: Once | ORAL | Status: DC
  Filled 2018-10-31: qty 2

## 2018-10-31 NOTE — ED Provider Notes (Signed)
[facility] DEPT Provider Note   CSN: [id number] Arrival date & time: 10/31/18  [id number]     History   Chief Complaint Chief Complaint  Patient presents with  . Fall  . Hip Pain    HPI Kara Diaz is a 78 y.o. female.     HPI Patient presents to the emergency room for evaluation of left hip injury.  Patient stumbled and tripped when walking 9 days ago.  She fell landing on her left side.  Patient has been able to walk since that time and has only had mild discomfort in her hip but she has noted persistent swelling.  Patient called her doctor and they suggested she come to the ED for evaluation.  She denies any fevers or chills.  No numbness or weakness.  No other complaints. Past Medical History:  Diagnosis Date  . Angina   . Arthritis    all over  . 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 Early cataracts, bilateral   . Hemorrhoids   . History of radiation therapy 06/07/11-07/11/11   left breast 50Gy/41fx  . Hyperlipidemia    doesn't take any meds for this  . Hypertension    takes Dyazide and Lotrel daily  . Insomnia    but doesn't take anything for this  . Joint pain   . Joint swelling   . Personal history of radiation therapy 2013  . Pneumonia    hx of in 1992  . PONV (postoperative nausea and vomiting)   . Urinary frequency     Patient Active Problem List   Diagnosis Date Noted  . Left hip pain 05/29/2014  . Medication side effect 09/05/2013  . Vitamin D deficiency 04/25/2013  . Visit for annual health examination 04/25/2013  . Hyperlipidemia with target LDL less than 100 04/25/2013  . Hypokalemia 11/23/2012  . Immunization due 11/23/2012  . Essential hypertension, benign 08/21/2012  . Acute blood loss anemia 08/21/2012  . Osteoarthritis of left knee S/P left total knee arthroplasty 08/21/2012  . Long term (current) use of anticoagulants 08/21/2012  . History of breast cancer  04/13/2012  . Mild obesity 03/09/2012  . 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 ABDOMINAL HYSTERECTOMY  1980   partial hysterectomy  . BREAST BIOPSY Left 03/25/2011  . BREAST LUMPECTOMY Left 05/09/2011   left breast lumpectomy  . CARDIAC CATHETERIZATION  12/16/10  . CHOLECYSTECTOMY  late 18's  . COLONOSCOPY    . JOINT REPLACEMENT Right 11/2011   knee replacement  . knee replacement surgery    . MYOMECTOMY     early 61's  . rotator cuff surgery  1998   right  . TONSILLECTOMY     as a child     OB History   No obstetric history on file.      Home Medications    Prior to Admission medications   Medication Sig Start Date End Date Taking? Authorizing Provider  amLODipine-benazepril (LOTREL) 10-40 MG capsule Take 1 capsule by mouth daily. 12/15/15   Micheline Chapman, NP  aspirin EC 81 MG tablet Take 81 mg by mouth daily.    [provider]  atorvastatin (LIPITOR) 40 MG tablet Take 1 tablet (40 mg total) by mouth daily. 03/14/16   Micheline Chapman, NP  Calcium 600-200 MG-UNIT per  tablet Take 1 tablet by mouth daily.    [provider]  cephALEXin (KEFLEX) 500 MG capsule Take 2 capsules (1,000 mg total) by mouth 2 (two) times daily. 06/21/17   Charlesetta Shanks, MD  Cholecalciferol (VITAMIN D3) 2000 UNITS TABS Take 1 tablet by mouth daily.    [provider]  cycloSPORINE (RESTASIS) 0.05 % ophthalmic emulsion Place 1 drop into both eyes 2 (two) times daily.    [provider]  HYDROcodone-acetaminophen (NORCO/VICODIN) 5-325 MG tablet Take 1-2 tablets by mouth every 4 (four) hours as needed for moderate pain or severe pain. 06/21/17   Charlesetta Shanks, MD  L-Methylfolate-Algae-B12-B6 [facility]) 3-90.314-2-35 MG CAPS Take 1 capsule by mouth 2 (two) times daily.    [provider]  Magnesium 400 MG CAPS Take 1 tablet by mouth  daily.    [provider]  Multiple Vitamins-Minerals (MULTIVITAMINS THER. W/MINERALS) TABS Take 1 tablet by mouth daily.    [provider]  potassium chloride SA (K-DUR,KLOR-CON) 20 MEQ tablet Take 20 mEq by mouth daily.    [provider]  triamterene-hydrochlorothiazide (DYAZIDE) 37.5-25 MG capsule Take 1 each (1 capsule total) by mouth every morning. 12/15/15   Micheline Chapman, NP    Family History Family History  Problem Relation Age of Onset  . Hypertension Mother   . Hypertension Father   . Cancer Brother        prostate  . Stroke Brother   . Cancer Cousin   . Cancer Cousin   . Malignant hyperthermia Neg Hx   . Hypotension Neg Hx   . Pseudochol deficiency Neg Hx     Social History Social History   Tobacco Use  . Smoking status: Former Smoker    Types: Cigarettes    Quit date: 01/10/1990    Years since quitting: 28.8  . Smokeless tobacco: Never Used  . Tobacco comment: quit in 1992  Substance Use Topics  . Alcohol use: Yes    Comment: wine few times a week  . Drug use: No     Allergies   Patient has no known allergies.   Review of Systems Review of Systems  All other systems reviewed and are negative.    Physical Exam Updated Vital Signs BP (!) 165/74 (BP Location: Left Arm)   Pulse 76   Temp 98.4 F (36.9 C) (Oral)   Resp 16   Ht 1.6 m (5\' 3" )   Wt 95.3 kg   SpO2 100%   BMI 37.20 kg/m   Physical Exam Vitals signs and nursing note reviewed.  Constitutional:      General: She is not in acute distress.    Appearance: She is well-developed.  HENT:     Head: Normocephalic and atraumatic.     Right Ear: External ear normal.     Left Ear: External ear normal.  Eyes:     General: No scleral icterus.       Right eye: No discharge.        Left eye: No discharge.     Conjunctiva/sclera: Conjunctivae normal.  Neck:     Musculoskeletal: Neck supple.     Trachea: No tracheal deviation.  Cardiovascular:     Rate and  Rhythm: Normal rate.  Pulmonary:     Effort: Pulmonary effort is normal. No respiratory distress.     Breath sounds: No stridor.  Abdominal:     General: There is no distension.  Musculoskeletal:        General: Tenderness  present. No deformity.     Comments: Mild tenderness palpation left hip, focal area of edema noted in the adipose tissue around the left hip, no erythema, no induration  Skin:    General: Skin is warm and dry.     Findings: No rash.  Neurological:     Mental Status: She is alert.     Cranial Nerves: Cranial nerve deficit: no gross deficits.      ED Treatments / Results  Labs (all labs ordered are listed, but only abnormal results are displayed) Labs Reviewed  BASIC METABOLIC PANEL - Abnormal; Notable for the following components:      Result Value   Potassium 3.0 (*)    All other components within normal limits  CBC    EKG None  Radiology Dg Hip Unilat With Pelvis 2-3 Views Left  Result Date: 10/31/2018 CLINICAL DATA:  78 year old female with fall and left hip pain. EXAM: DG HIP (WITH OR WITHOUT PELVIS) 2-3V LEFT COMPARISON:  Left knee radiograph dated 05/29/2014 FINDINGS: There is no acute fracture or dislocation. The bones are osteopenic. Bilateral mild to moderate osteoarthritic changes of the hips, left greater than right, and similar or slightly progressed since the study of 2016. There is degenerative changes of the lower lumbar spine. The soft tissues are unremarkable. IMPRESSION: 1. No acute fracture or dislocation. 2. Osteopenia with osteoarthritic changes of the hips. Electronically Signed   By: Anner Crete M.D.   On: 10/31/2018 09:41    Procedures Procedures (including critical care time)  Medications Ordered in ED Medications  potassium chloride SA (KLOR-CON) CR tablet 40 mEq (has no administration in time range)     Initial Impression / Assessment and Plan / ED Course  I have reviewed the triage vital signs and the nursing notes.   Pertinent labs & imaging results that were available during my care of the patient were reviewed by me and considered in my medical decision making (see chart for details).   Patient presents to the ED for evaluation after a fall.  She has been able to walk without any significant difficulty.  Patient was just concerned about the persistent swelling.  X-rays do not show any signs of fracture.  Laboratory tests show mild hypokalemia but no signs of any anemia.  Findings are not suggestive of infection.  I suspect she has a soft tissue contusion. Final Clinical Impressions(s) / ED Diagnoses   Final diagnoses:  Contusion of left hip, initial encounter    ED Discharge Orders    None       Dorie Rank, MD 10/31/18 1025

## 2018-10-31 NOTE — Discharge Instructions (Addendum)
The x-rays did not show any signs of fracture.  Swelling is likely related to a bruise.  This may take a few weeks to resolve.

## 2018-10-31 NOTE — ED Triage Notes (Signed)
Patient states she fell 9 days ago when she was walking through a doorway and tripped . Patient states she landed on her left side. Patient states she called her PCP and was told to come to the ED. Patient c/o left hip pain.

## 2018-11-09 ENCOUNTER — Other Ambulatory Visit: Payer: Self-pay | Admitting: Cardiology

## 2018-11-09 DIAGNOSIS — Z1231 Encounter for screening mammogram for malignant neoplasm of breast: Secondary | ICD-10-CM

## 2019-01-02 ENCOUNTER — Ambulatory Visit
Admission: RE | Admit: 2019-01-02 | Discharge: 2019-01-02 | Disposition: A | Discharge status: Home / Self Care | Payer: Medicare Other | Source: Ambulatory Visit | Attending: Cardiology | Admitting: Cardiology

## 2019-01-02 ENCOUNTER — Other Ambulatory Visit: Payer: Self-pay

## 2019-01-02 DIAGNOSIS — Z1231 Encounter for screening mammogram for malignant neoplasm of breast: Secondary | ICD-10-CM

## 2019-06-21 ENCOUNTER — Other Ambulatory Visit: Payer: Self-pay

## 2019-06-24 ENCOUNTER — Other Ambulatory Visit: Payer: Self-pay

## 2019-06-24 ENCOUNTER — Ambulatory Visit (INDEPENDENT_AMBULATORY_CARE_PROVIDER_SITE_OTHER): Payer: Medicare Other | Admitting: Family Medicine

## 2019-06-24 ENCOUNTER — Encounter: Payer: Self-pay | Admitting: Family Medicine

## 2019-06-24 VITALS — BP 150/90 | HR 100 | Temp 98.0°F | Resp 16 | Ht 63.0 in | Wt 210.1 lb

## 2019-06-24 DIAGNOSIS — I10 Essential (primary) hypertension: Secondary | ICD-10-CM | POA: Diagnosis not present

## 2019-06-24 DIAGNOSIS — I499 Cardiac arrhythmia, unspecified: Secondary | ICD-10-CM

## 2019-06-24 DIAGNOSIS — R61 Generalized hyperhidrosis: Secondary | ICD-10-CM | POA: Diagnosis not present

## 2019-06-24 DIAGNOSIS — E559 Vitamin D deficiency, unspecified: Secondary | ICD-10-CM

## 2019-06-24 NOTE — Patient Instructions (Addendum)
A few things to remember from today's visit:   Irregular heart rhythm - Plan: EKG 12-Lead  Hypercalcemia - Plan: CBC with Differential/Platelet, Parathyroid hormone, intact (no Ca), Basic metabolic panel, VITAMIN D 25 Hydroxy (Vit-D Deficiency, Fractures)  Night sweats - Plan: CBC with Differential/Platelet, TSH  Resistant hypertension  Stop Losartan. Monitor blood pressure, I would like numbers under 140/90. Lab tomorrow at [facility].  Please be sure medication list is accurate. If a new problem present, please set up appointment sooner than planned today.

## 2019-06-24 NOTE — Progress Notes (Signed)
ACUTE VISIT  Chief Complaint  Patient presents with  . Had previous dx with high calcium in blood    dx 2 years ago, wants to follow-up   HPI: Kara Diaz is a 79 y.o. female with hx of HTN,HLD,and atherosclerotic heart disease here today with above concern. This problem seems to be chronic. She has had elevated Ca++ intermittently for years. Negative for MS changes,N/V, muscle cramps,or numbness/tingling.  Hx of ductal carcinoma left breast dx'ed in 03/2011, s/p radiation therapy. Hx of vit D deficiency,she is not on vit D supplementation.  She has not noted fever,chills,or abnormal wt loss.  +Night sweats for the past year. Negative for palpitations or tremor.  Lab Results  Component Value Date   TSH 0.70 09/11/2015   Stopped smoking in 1992.  HTN Dx'ed at age 75. BP elevated today. She is on Amlodipine-Benazepril 10-40 mg daily,Triamteren-HCTZ 37.5-25 mg daily, and Losartan 50 mg daily.  HypoK+, she is on KDUR 20 meq daily. Negative for severe/frequent headache, visual changes, chest pain, dyspnea,focal weakness, or worsening edema.  Review of Systems  Constitutional: Negative for activity change, appetite change and fatigue.  HENT: Negative for mouth sores, nosebleeds and trouble swallowing.   Respiratory: Negative for cough and wheezing.   Gastrointestinal: Negative for abdominal pain.       Negative for changes in bowel habits.  Endocrine: Negative for cold intolerance and heat intolerance.  Genitourinary: Negative for decreased urine volume, dysuria and hematuria.  Musculoskeletal: Negative for gait problem and myalgias.  Neurological: Negative for syncope and facial asymmetry.  Psychiatric/Behavioral: Negative for confusion. The patient is nervous/anxious.   Rest see pertinent positives and negatives per HPI.  Current Outpatient Medications on File Prior to Visit  Medication Sig Dispense Refill  . amLODipine-benazepril (LOTREL) 10-40 MG  capsule Take 1 capsule by mouth daily. 90 capsule 3  . aspirin EC 81 MG tablet Take 81 mg by mouth daily.    Marland Kitchen atorvastatin (LIPITOR) 40 MG tablet Take 1 tablet (40 mg total) by mouth daily. 90 tablet 3  . Cholecalciferol (VITAMIN D3) 2000 UNITS TABS Take 1 tablet by mouth daily.    . cycloSPORINE (RESTASIS) 0.05 % ophthalmic emulsion Place 1 drop into both eyes 2 (two) times daily.    Marland Kitchen HYDROcodone-acetaminophen (NORCO/VICODIN) 5-325 MG tablet Take 1-2 tablets by mouth every 4 (four) hours as needed for moderate pain or severe pain. 20 tablet 0  . L-Methylfolate-Algae-B12-B6 (METANX) 3-90.314-2-35 MG CAPS Take 1 capsule by mouth 2 (two) times daily.    . potassium chloride SA (K-DUR,KLOR-CON) 20 MEQ tablet Take 20 mEq by mouth daily.    Marland Kitchen triamterene-hydrochlorothiazide (DYAZIDE) 37.5-25 MG capsule Take 1 each (1 capsule total) by mouth every morning. 90 capsule 3   No current facility-administered medications on file prior to visit.   Past Medical History:  Diagnosis Date  . Angina   . Arthritis    all over  . 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 Early cataracts, bilateral   . Hemorrhoids   . History of radiation therapy 06/07/11-07/11/11   left breast 50Gy/15fx  . Hyperlipidemia    doesn't take any meds for this  . Hypertension    takes Dyazide and Lotrel daily  . Insomnia    but doesn't take anything for this  . Joint pain   . Joint swelling   . Personal history of radiation therapy 2013  . Pneumonia  hx of in 1992  . PONV (postoperative nausea and vomiting)   . Urinary frequency    No Known Allergies  Social History   Socioeconomic History  . Marital status: Single    Spouse name: Not on file  . Number of children: Not on file  . Years of education: Not on file  . Highest education level: Not on file  Occupational History  . Not on file  Tobacco Use  . Smoking status: Former Smoker    Types: Cigarettes    Quit date:  01/10/1990    Years since quitting: 29.4  . Smokeless tobacco: Never Used  . Tobacco comment: quit in 1992  Vaping Use  . Vaping Use: Never used  Substance and Sexual Activity  . Alcohol use: Yes    Comment: wine few times a week  . Drug use: No  . Sexual activity: Not Currently    Birth control/protection: Surgical  Other Topics Concern  . Not on file  Social History Narrative  . Not on file   Social Determinants of Health   Financial Resource Strain:   . Difficulty of Paying Living Expenses:   Food Insecurity:   . Worried About Charity fundraiser in the Last Year:   . Arboriculturist in the Last Year:   Transportation Needs:   . Film/video editor (Medical):   Marland Kitchen Lack of Transportation (Non-Medical):   Physical Activity:   . Days of Exercise per Week:   . Minutes of Exercise per Session:   Stress:   . Feeling of Stress :   Social Connections:   . Frequency of Communication with Friends and Family:   . Frequency of Social Gatherings with Friends and Family:   . Attends Religious Services:   . Active Member of Clubs or Organizations:   . Attends Archivist Meetings:   Marland Kitchen Marital Status:     Vitals:   06/24/19 1642  BP: (!) 150/90  Pulse: 100  Resp: 16  Temp: 98 F (36.7 C)  SpO2: 98%   Body mass index is 37.22 kg/m.  Physical Exam  Nursing note and vitals reviewed. Constitutional: She is oriented to person, place, and time. She appears well-developed. No distress.  HENT:  Head: Normocephalic and atraumatic.  Eyes: Pupils are equal, round, and reactive to light. Conjunctivae are normal.  Cardiovascular: Normal rate. An irregular rhythm present.  Occasional extrasystoles are present.  No murmur heard. Pulses:      Dorsalis pedis pulses are 2+ on the right side and 2+ on the left side.  Respiratory: Effort normal and breath sounds normal. No respiratory distress.  GI: Soft. She exhibits no mass. There is no hepatomegaly. There is no abdominal  tenderness.  Musculoskeletal:     Right lower leg: 2+ Edema present.     Left lower leg: 2+ Edema present.     Comments: Pitting edema.   Lymphadenopathy:    She has no cervical adenopathy.  Neurological: She is alert and oriented to person, place, and time. No cranial nerve deficit. Gait normal.  Skin: Skin is warm. No rash noted. No erythema.  Psychiatric: Affect normal. Her mood appears anxious.  Well groomed, good eye contact.   ASSESSMENT AND PLAN:  Ms.Sumie was seen today for had previous dx with high calcium in blood.  Diagnoses and all orders for this visit:  Orders Placed This Encounter  Procedures  . CBC with Differential/Platelet  . TSH  . Parathyroid hormone, intact (  no Ca)  . Basic metabolic panel  . VITAMIN D 25 Hydroxy (Vit-D Deficiency, Fractures)  . EKG 12-Lead   Lab Results  Component Value Date   WBC 2.9 (L) 06/26/2019   HGB 13.3 06/26/2019   HCT 39.1 06/26/2019   MCV 95.7 06/26/2019   PLT 291.0 06/26/2019   Lab Results  Component Value Date   TSH 0.73 06/26/2019   Lab Results  Component Value Date   CALCIUM 10.6 (H) 06/26/2019   Lab Results  Component Value Date   CREATININE 0.71 06/26/2019   BUN 9 06/26/2019   NA 140 06/26/2019   K 3.7 06/26/2019   CL 103 06/26/2019   CO2 29 06/26/2019    Irregular heart rhythm Denies prior Hx and that she has not had an ekg in years. We tried with 2 machines here in the office and we were not able to have EGG done. 10/2010 normal EKG. Instructed about warning signs.  Hypercalcemia We discussed possible etiologies,benign and more serious process. This seems to be a chronic problem. Further recommendations will be given according to lab results.  Night sweats ?hormonal. Will check TSH and CBC, further recommendations according to lab results.  Vitamin D deficiency, unspecified Not on vit D supplementation.  Resistant hypertension Because she is taking benazepril, recommend stopping  losartan. She is not interested in adding medication, BB will be the next options. Instructed to monitor BP at home. We discussed possible complications of elevated BP.    Return in about 4 weeks (around 07/22/2019) for HTN.   Maitland Lesiak G. [place], MD  [facility]. [place] office.  Discharge Instructions   None    Irregular heart rhythm - Plan: EKG 12-Lead  Hypercalcemia - Plan: CBC with Differential/Platelet, Parathyroid hormone, intact (no Ca), Basic metabolic panel, VITAMIN D 25 Hydroxy (Vit-D Deficiency, Fractures)  Night sweats - Plan: CBC with Differential/Platelet, TSH  Resistant hypertension

## 2019-06-24 NOTE — Assessment & Plan Note (Addendum)
Because she is taking benazepril, recommend stopping losartan. She is not interested in adding medication, BB will be the next option. Instructed to monitor BP at home. We discussed possible complications of elevated BP.

## 2019-06-26 ENCOUNTER — Other Ambulatory Visit (INDEPENDENT_AMBULATORY_CARE_PROVIDER_SITE_OTHER): Payer: Medicare Other

## 2019-06-26 DIAGNOSIS — R61 Generalized hyperhidrosis: Secondary | ICD-10-CM | POA: Diagnosis not present

## 2019-06-26 LAB — CBC WITH DIFFERENTIAL/PLATELET
Basophils Absolute: 0 10*3/uL (ref 0.0–0.1)
Basophils Relative: 1 % (ref 0.0–3.0)
Eosinophils Absolute: 0.1 10*3/uL (ref 0.0–0.7)
Eosinophils Relative: 3.8 % (ref 0.0–5.0)
HCT: 39.1 % (ref 36.0–46.0)
Hemoglobin: 13.3 g/dL (ref 12.0–15.0)
Lymphocytes Relative: 48.9 % — ABNORMAL HIGH (ref 12.0–46.0)
Lymphs Abs: 1.4 10*3/uL (ref 0.7–4.0)
MCHC: 33.9 g/dL (ref 30.0–36.0)
MCV: 95.7 fl (ref 78.0–100.0)
Monocytes Absolute: 0.3 10*3/uL (ref 0.1–1.0)
Monocytes Relative: 9.1 % (ref 3.0–12.0)
Neutro Abs: 1.1 10*3/uL — ABNORMAL LOW (ref 1.4–7.7)
Neutrophils Relative %: 37.2 % — ABNORMAL LOW (ref 43.0–77.0)
Platelets: 291 10*3/uL (ref 150.0–400.0)
RBC: 4.09 Mil/uL (ref 3.87–5.11)
RDW: 13.2 % (ref 11.5–15.5)
WBC: 2.9 10*3/uL — ABNORMAL LOW (ref 4.0–10.5)

## 2019-06-26 LAB — BASIC METABOLIC PANEL
BUN: 9 mg/dL (ref 6–23)
CO2: 29 mEq/L (ref 19–32)
Calcium: 10.6 mg/dL — ABNORMAL HIGH (ref 8.4–10.5)
Chloride: 103 mEq/L (ref 96–112)
Creatinine, Ser: 0.71 mg/dL (ref 0.40–1.20)
GFR: 96.29 mL/min (ref >60.00)
Glucose, Bld: 87 mg/dL (ref 70–99)
Potassium: 3.7 mEq/L (ref 3.5–5.1)
Sodium: 140 mEq/L (ref 135–145)

## 2019-06-26 LAB — VITAMIN D 25 HYDROXY (VIT D DEFICIENCY, FRACTURES): VITD: 55.94 ng/mL (ref 30.00–100.00)

## 2019-06-26 LAB — TSH: TSH: 0.73 u[IU]/mL (ref 0.35–4.50)

## 2019-06-27 LAB — PARATHYROID HORMONE, INTACT (NO CA): PTH: 49 pg/mL (ref 14–64)

## 2019-06-27 LAB — EXTRA SPECIMEN

## 2019-07-18 ENCOUNTER — Telehealth (INDEPENDENT_AMBULATORY_CARE_PROVIDER_SITE_OTHER): Payer: Medicare Other | Admitting: Family Medicine

## 2019-07-18 ENCOUNTER — Other Ambulatory Visit: Payer: Self-pay

## 2019-07-18 ENCOUNTER — Encounter: Payer: Self-pay | Admitting: Family Medicine

## 2019-07-18 DIAGNOSIS — M79662 Pain in left lower leg: Secondary | ICD-10-CM | POA: Diagnosis not present

## 2019-07-18 DIAGNOSIS — M7989 Other specified soft tissue disorders: Secondary | ICD-10-CM

## 2019-07-18 NOTE — Progress Notes (Signed)
Virtual Visit via Telephone Note  I connected with Kara Diaz on 07/18/19 at  4:40 PM EDT by telephone and verified that I am speaking with the correct person using two identifiers.   I discussed the limitations, risks, security and privacy concerns of performing an evaluation and management service by telephone and the availability of in person appointments. I also discussed with the patient that there may be a patient responsible charge related to this service. The patient expressed understanding and agreed to proceed.  Location patient: home, [place] Location provider: work or home office Participants present for the call: patient, provider Patient did not have a visit in the prior 7 days to address this/these issue(s).   History of Present Illness:  Acute visit for concern for L leg pain: -started about 1 week but are getting progressively worse -symptoms include significant unilateral swelling of the L leg up to the knees, TTP calf, pain in the calf, feels a little warmer than the L -pain has worsened today -reports the skin seems about the same other than a little darker in color that the other -no cuts or wounds -denies fevers, malaise, SOB, CP -has hx of cellulitis in the R leg   Observations/Objective: Patient sounds cheerful and well on the phone. I do not appreciate any SOB. Speech and thought processing are grossly intact. Hurts when squeezes her calf Patient reported vitals:  Assessment and Plan:  Pain of left calf  Leg swelling  -we discussed possible serious and likely etiologies, options for evaluation and workup, limitations of telemedicine visit vs in person visit, treatment, treatment risks and precautions. Had a lengthy discussion regarding potential etiologies - several serious including infection, DVT vs other. Advised a referral for inperson evaluation today. Discussed options including nearby ucc and ERs given her PCP office is now close. She agrees  to seek evaluation. She if fully vaccinated for COVID19. Feels is stable for self transport. .  Follow Up Instructions:   I did not refer this patient for an OV in the next 24 hours for this/these issue(s).  I discussed the assessment and treatment plan with the patient. The patient was provided an opportunity to ask questions and all were answered. The patient agreed with the plan and demonstrated an understanding of the instructions.   The patient was advised to call back or seek an in-person evaluation if the symptoms worsen or if the condition fails to improve as anticipated.  I provided 20 minutes of non-face-to-face time during this encounter.   Lucretia Kern, DO

## 2019-07-19 ENCOUNTER — Encounter (HOSPITAL_COMMUNITY): Payer: Self-pay

## 2019-07-19 ENCOUNTER — Emergency Department (HOSPITAL_BASED_OUTPATIENT_CLINIC_OR_DEPARTMENT_OTHER): Payer: Medicare Other

## 2019-07-19 ENCOUNTER — Other Ambulatory Visit: Payer: Self-pay

## 2019-07-19 ENCOUNTER — Emergency Department (HOSPITAL_COMMUNITY): Payer: Medicare Other

## 2019-07-19 ENCOUNTER — Emergency Department (HOSPITAL_COMMUNITY)
Admission: EM | Admit: 2019-07-19 | Discharge: 2019-07-19 | Disposition: A | Discharge status: Home / Self Care | Payer: Medicare Other | Attending: Emergency Medicine | Admitting: Emergency Medicine

## 2019-07-19 DIAGNOSIS — Z79899 Other long term (current) drug therapy: Secondary | ICD-10-CM | POA: Diagnosis not present

## 2019-07-19 DIAGNOSIS — L03116 Cellulitis of left lower limb: Secondary | ICD-10-CM | POA: Diagnosis not present

## 2019-07-19 DIAGNOSIS — M79605 Pain in left leg: Secondary | ICD-10-CM | POA: Diagnosis not present

## 2019-07-19 DIAGNOSIS — Z853 Personal history of malignant neoplasm of breast: Secondary | ICD-10-CM | POA: Insufficient documentation

## 2019-07-19 DIAGNOSIS — Z7982 Long term (current) use of aspirin: Secondary | ICD-10-CM | POA: Insufficient documentation

## 2019-07-19 DIAGNOSIS — Z87891 Personal history of nicotine dependence: Secondary | ICD-10-CM | POA: Diagnosis not present

## 2019-07-19 DIAGNOSIS — M7989 Other specified soft tissue disorders: Secondary | ICD-10-CM

## 2019-07-19 DIAGNOSIS — Z96651 Presence of right artificial knee joint: Secondary | ICD-10-CM | POA: Insufficient documentation

## 2019-07-19 DIAGNOSIS — I10 Essential (primary) hypertension: Secondary | ICD-10-CM | POA: Diagnosis not present

## 2019-07-19 DIAGNOSIS — R2242 Localized swelling, mass and lump, left lower limb: Secondary | ICD-10-CM | POA: Diagnosis present

## 2019-07-19 LAB — CBC WITH DIFFERENTIAL/PLATELET
Abs Immature Granulocytes: 0 10*3/uL (ref 0.00–0.07)
Basophils Absolute: 0 10*3/uL (ref 0.0–0.1)
Basophils Relative: 0 %
Eosinophils Absolute: 0.1 10*3/uL (ref 0.0–0.5)
Eosinophils Relative: 3 %
HCT: 41.3 % (ref 36.0–46.0)
Hemoglobin: 13.2 g/dL (ref 12.0–15.0)
Immature Granulocytes: 0 %
Lymphocytes Relative: 40 %
Lymphs Abs: 1.4 10*3/uL (ref 0.7–4.0)
MCH: 31.8 pg (ref 26.0–34.0)
MCHC: 32 g/dL (ref 30.0–36.0)
MCV: 99.5 fL (ref 80.0–100.0)
Monocytes Absolute: 0.3 10*3/uL (ref 0.1–1.0)
Monocytes Relative: 10 %
Neutro Abs: 1.6 10*3/uL — ABNORMAL LOW (ref 1.7–7.7)
Neutrophils Relative %: 47 %
Platelets: 264 10*3/uL (ref 150–400)
RBC: 4.15 MIL/uL (ref 3.87–5.11)
RDW: 12.6 % (ref 11.5–15.5)
WBC: 3.4 10*3/uL — ABNORMAL LOW (ref 4.0–10.5)
nRBC: 0 % (ref 0.0–0.2)

## 2019-07-19 LAB — COMPREHENSIVE METABOLIC PANEL
ALT: 20 U/L (ref 0–44)
AST: 26 U/L (ref 15–41)
Albumin: 4.5 g/dL (ref 3.5–5.0)
Alkaline Phosphatase: 100 U/L (ref 38–126)
Anion gap: 9 (ref 5–15)
BUN: 9 mg/dL (ref 8–23)
CO2: 29 mmol/L (ref 22–32)
Calcium: 10.4 mg/dL — ABNORMAL HIGH (ref 8.9–10.3)
Chloride: 102 mmol/L (ref 98–111)
Creatinine, Ser: 0.83 mg/dL (ref 0.44–1.00)
GFR calc Af Amer: >60 mL/min (ref >60)
GFR calc non Af Amer: >60 mL/min (ref >60)
Glucose, Bld: 95 mg/dL (ref 70–99)
Potassium: 3.2 mmol/L — ABNORMAL LOW (ref 3.5–5.1)
Sodium: 140 mmol/L (ref 135–145)
Total Bilirubin: 1.2 mg/dL (ref 0.3–1.2)
Total Protein: 8.2 g/dL — ABNORMAL HIGH (ref 6.5–8.1)

## 2019-07-19 MED ORDER — POTASSIUM CHLORIDE CRYS ER 20 MEQ PO TBCR
40.0000 meq | EXTENDED_RELEASE_TABLET | Freq: Once | ORAL | Status: AC
  Administered 2019-07-19: 40 meq via ORAL
  Filled 2019-07-19: qty 2

## 2019-07-19 MED ORDER — ACETAMINOPHEN 325 MG PO TABS
650.0000 mg | ORAL_TABLET | Freq: Once | ORAL | Status: AC
  Administered 2019-07-19: 650 mg via ORAL
  Filled 2019-07-19: qty 2

## 2019-07-19 MED ORDER — CEPHALEXIN 500 MG PO CAPS
500.0000 mg | ORAL_CAPSULE | Freq: Four times a day (QID) | ORAL | 0 refills | Status: AC
Start: 2019-07-19 — End: 2019-07-26

## 2019-07-19 NOTE — ED Notes (Signed)
RN attempted blood draw without success. Patient reports a cancer hx and restricted left extremity.  Vascular at bedside at this time, will attempt to obtain Blood work when [place] finished.

## 2019-07-19 NOTE — Progress Notes (Signed)
Left lower extremity venous duplex has been completed. Preliminary results can be found in CV Proc through chart review.  Results were given to [facility] PA.  07/19/19 9:58 AM Carlos Levering RVT

## 2019-07-19 NOTE — Discharge Instructions (Addendum)
You were seen today for cellulitis, your work-up was reassuring.  I want you to use the attached instructions.  Take the antibiotics as prescribed.  Take Tylenol as prescribed on the bottle for pain.  If you start having any new or worsening concerning symptoms come back to the emergency department.  These can include high fever, redness and swelling extending past her knee, worsening symptoms, color changes, numbness or tingling, weakness.  I want you to follow-up with your primary care in the next couple of days.  Keep your appointment for next Monday.  I also want you to talk to your primary care about your low potassium and your high blood pressure.  Want you to take your blood pressure medicine when you get home.  If you start to have any chest pain or shortness of breath please come back to the emergency department.

## 2019-07-19 NOTE — ED Provider Notes (Signed)
[facility] DEPT Provider Note   CSN: [id number] Arrival date & time: 07/19/19  4174     History Chief Complaint  Patient presents with  . Leg Swelling    Kara Diaz is a 79 y.o. female with pertinent past medical history of arthritis, hypertension, hyperlipidemia that presents the emergency department today for left calf swelling.  Patient states that this has been occurring for the past week.  States that it is extremely tender to touch, specifically on her calf.  States that she is also noticed that is been more hot and red.  Denies any history of clotting disorders, recent long travel, recent surgery, current diagnosis of cancer.  Denies any fevers, chills, URI-like symptoms.  States that she was in normal health before this.  Has been taking medications as prescribed, has not taken blood pressure medicine this morning.  Denies history of DVT.  Denies any chest pain or shortness of breath.  States that she has not been taking anything for the pain.  States that she has been using her compression stockings.  States that she does take baby aspirin daily, is not on any other blood thinners.  Per chart review patient was seen for this same in 2019, was diagnosed with cellulitis.  HPI     Past Medical History:  Diagnosis Date  . Angina   . Arthritis    all over  . 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 Early cataracts, bilateral   . Hemorrhoids   . History of radiation therapy 06/07/11-07/11/11   left breast 50Gy/11fx  . Hyperlipidemia    doesn't take any meds for this  . Hypertension    takes Dyazide and Lotrel daily  . Insomnia    but doesn't take anything for this  . Joint pain   . Joint swelling   . Personal history of radiation therapy 2013  . Pneumonia    hx of in 1992  . PONV (postoperative nausea and vomiting)   . Urinary frequency     Patient Active Problem List   Diagnosis Date Noted    . Left hip pain 05/29/2014  . Medication side effect 09/05/2013  . Vitamin D deficiency, unspecified 04/25/2013  . Visit for annual health examination 04/25/2013  . Hyperlipidemia with target LDL less than 100 04/25/2013  . Hypokalemia 11/23/2012  . Immunization due 11/23/2012  . 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 Acute blood loss anemia 08/21/2012  . Osteoarthritis of left knee S/P left total knee arthroplasty 08/21/2012  . Long term (current) use of anticoagulants 08/21/2012  . History of breast cancer 04/13/2012  . Mild obesity 03/09/2012  . 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 ABDOMINAL HYSTERECTOMY  1980   partial hysterectomy  . BREAST BIOPSY Left 03/25/2011  . BREAST LUMPECTOMY Left 05/09/2011   left breast lumpectomy  . CARDIAC CATHETERIZATION  12/16/10  . CHOLECYSTECTOMY  late 45's  . COLONOSCOPY    . JOINT REPLACEMENT Right 11/2011   knee replacement  . knee replacement surgery    . MYOMECTOMY     early 25's  . rotator cuff surgery  1998   right  . TONSILLECTOMY     as a child     OB History   No obstetric history on file.     Family History  Problem Relation Age of Onset  . Hypertension Mother   . Hypertension Father   . Diabetes Father   . Cancer Brother        prostate  . Stroke Brother   . Cancer Cousin   . Cancer Cousin   . Malignant hyperthermia Neg Hx   . Hypotension Neg Hx   . Pseudochol deficiency Neg Hx     Social History   Tobacco Use  . Smoking status: Former Smoker    Types: Cigarettes    Quit date: 01/10/1990    Years since quitting: 29.5  . Smokeless tobacco: Never Used  . Tobacco comment: quit in 1992  Vaping Use  . Vaping Use: Never used  Substance Use Topics  . Alcohol use: Yes    Comment: wine few times a week  . Drug use: No    Home Medications Prior to Admission medications   Medication  Sig Start Date End Date Taking? Authorizing Provider  amLODipine-benazepril (LOTREL) 10-40 MG capsule Take 1 capsule by mouth daily. 12/15/15  Yes Micheline Chapman, NP  aspirin EC 81 MG tablet Take 81 mg by mouth daily.   Yes [provider]  atorvastatin (LIPITOR) 40 MG tablet Take 1 tablet (40 mg total) by mouth daily. 03/14/16  Yes Micheline Chapman, NP  Cholecalciferol (VITAMIN D3) 2000 UNITS TABS Take 2,000 Units by mouth daily.    Yes [provider]  cycloSPORINE (RESTASIS) 0.05 % ophthalmic emulsion Place 1 drop into both eyes 2 (two) times daily.   Yes [provider]  L-Methylfolate-Algae-B12-B6 Glade Stanford) 3-90.314-2-35 MG CAPS Take 1 capsule by mouth 2 (two) times daily.   Yes [provider]  potassium chloride SA (K-DUR,KLOR-CON) 20 MEQ tablet Take 20 mEq by mouth daily.   Yes [provider]  triamterene-hydrochlorothiazide (DYAZIDE) 37.5-25 MG capsule Take 1 each (1 capsule total) by mouth every morning. 12/15/15  Yes Micheline Chapman, NP  cephALEXin (KEFLEX) 500 MG capsule Take 1 capsule (500 mg total) by mouth 4 (four) times daily for 7 days. 07/19/19 07/26/19  Alfredia Client, PA-C  HYDROcodone-acetaminophen (NORCO/VICODIN) 5-325 MG tablet Take 1-2 tablets by mouth every 4 (four) hours as needed for moderate pain or severe pain. Patient not taking: Reported on 07/19/2019 06/21/17   Charlesetta Shanks, MD    Allergies    Patient has no known allergies.  Review of Systems   Review of Systems  Constitutional: Negative for chills, diaphoresis, fatigue and fever.  HENT: Negative for congestion, sore throat and trouble swallowing.   Eyes: Negative for pain and visual disturbance.  Respiratory: Negative for cough, shortness of breath and wheezing.   Cardiovascular: Positive for leg swelling. Negative for chest pain and palpitations.  Gastrointestinal: Negative for abdominal distention, abdominal pain, diarrhea, nausea and vomiting.  Genitourinary:  Negative for difficulty urinating.  Musculoskeletal: Negative for back pain, neck pain and neck stiffness.  Skin: Negative for pallor.  Neurological: Negative for dizziness, speech difficulty, weakness and headaches.  Psychiatric/Behavioral: Negative for confusion.    Physical Exam Updated Vital Signs BP (!) 177/84 (BP Location: Left Arm)   Pulse 73   Temp 98.2 F (36.8 C) (Oral)   Resp 16   Ht 5\' 3"  (1.6 m)   Wt 93.9 kg   SpO2 100%   BMI 36.67 kg/m   Physical Exam Constitutional:      General: She is not in acute distress.    Appearance: Normal appearance. She is not ill-appearing, toxic-appearing or diaphoretic.  HENT:  Mouth/Throat:     Mouth: Mucous membranes are moist.     Pharynx: Oropharynx is clear.  Eyes:     General: No scleral icterus.    Extraocular Movements: Extraocular movements intact.     Pupils: Pupils are equal, round, and reactive to light.  Cardiovascular:     Rate and Rhythm: Normal rate and regular rhythm.     Pulses: Normal pulses.     Heart sounds: Normal heart sounds.  Pulmonary:     Effort: Pulmonary effort is normal. No respiratory distress.     Breath sounds: Normal breath sounds. No stridor. No wheezing, rhonchi or rales.  Chest:     Chest wall: No tenderness.  Abdominal:     General: Abdomen is flat. There is no distension.     Palpations: Abdomen is soft.     Tenderness: There is no abdominal tenderness. There is no guarding or rebound.  Musculoskeletal:        General: Tenderness present. No swelling. Normal range of motion.     Cervical back: Normal range of motion and neck supple. No rigidity.     Right lower leg: No edema.     Left lower leg: Edema (Left leg with pitting edema on foot up until calf.  Extremely tender to touch.  Warmth and erythema noted around this area as well.  See picture.) present.     Comments: Patient is able to move bilateral lower extremity without any difficulties.  Normal strength to left leg, ankle,  foot.  Normal sensation throughout.  Compartments soft DP PT pulses 2+.  Right leg without any abnormalities.  Skin:    General: Skin is warm and dry.     Capillary Refill: Capillary refill takes less than 2 seconds.     Coloration: Skin is not pale.  Neurological:     General: No focal deficit present.     Mental Status: She is alert and oriented to person, place, and time.     Cranial Nerves: No cranial nerve deficit.     Sensory: No sensory deficit.     Motor: No weakness.     Coordination: Coordination normal.  Psychiatric:        Mood and Affect: Mood normal.        Behavior: Behavior normal.       ED Results / Procedures / Treatments   Labs (all labs ordered are listed, but only abnormal results are displayed) Labs Reviewed  CBC WITH DIFFERENTIAL/PLATELET - Abnormal; Notable for the following components:      Result Value   WBC 3.4 (*)    Neutro Abs 1.6 (*)    All other components within normal limits  COMPREHENSIVE METABOLIC PANEL - Abnormal; Notable for the following components:   Potassium 3.2 (*)    Calcium 10.4 (*)    Total Protein 8.2 (*)    All other components within normal limits    EKG None  Radiology DG Tibia/Fibula Left  Result Date: 07/19/2019 CLINICAL DATA:  Left leg pain EXAM: LEFT TIBIA AND FIBULA - 2 VIEW COMPARISON:  None. FINDINGS: Surgical changes of left total knee arthroplasty are identified with arthroplasty components in near anatomic alignment. No fracture or dislocation. No destructive osseous lesion. Soft tissues are unremarkable. IMPRESSION: Negative. Electronically Signed   By: Fidela Salisbury MD   On: 07/19/2019 10:27   VAS [place] LOWER EXTREMITY VENOUS (DVT) ([facility] and [facility] 7a-7p)  Result Date: 07/19/2019  Lower Venous DVTStudy Indications: Swelling.  Risk Factors:  None identified. Limitations: Poor ultrasound/tissue interface and body habitus. Comparison Study: No prior studies. Performing Technologist: Oliver Hum RVT  Examination  Guidelines: A complete evaluation includes B-mode imaging, spectral Doppler, color Doppler, and power Doppler as needed of all accessible portions of each vessel. Bilateral testing is considered an integral part of a complete examination. Limited examinations for reoccurring indications may be performed as noted. The reflux portion of the exam is performed with the patient in reverse Trendelenburg.  +-----+---------------+---------+-----------+----------+--------------+ RIGHTCompressibilityPhasicitySpontaneityPropertiesThrombus Aging +-----+---------------+---------+-----------+----------+--------------+ CFV  Full           Yes      Yes                                 +-----+---------------+---------+-----------+----------+--------------+   +---------+---------------+---------+-----------+----------+--------------+ LEFT     CompressibilityPhasicitySpontaneityPropertiesThrombus Aging +---------+---------------+---------+-----------+----------+--------------+ CFV      Full           Yes      Yes                                 +---------+---------------+---------+-----------+----------+--------------+ SFJ      Full                                                        +---------+---------------+---------+-----------+----------+--------------+ FV Prox  Full                                                        +---------+---------------+---------+-----------+----------+--------------+ FV Mid   Full                                                        +---------+---------------+---------+-----------+----------+--------------+ FV Distal               Yes      Yes                                 +---------+---------------+---------+-----------+----------+--------------+ PFV      Full                                                        +---------+---------------+---------+-----------+----------+--------------+ POP      Full           Yes      Yes                                  +---------+---------------+---------+-----------+----------+--------------+ PTV      Full                                                        +---------+---------------+---------+-----------+----------+--------------+  PERO     Full                                                        +---------+---------------+---------+-----------+----------+--------------+     Summary: RIGHT: - No evidence of common femoral vein obstruction.  LEFT: - There is no evidence of deep vein thrombosis in the lower extremity. However, portions of this examination were limited- see technologist comments above.  - No cystic structure found in the popliteal fossa.  *See table(s) above for measurements and observations.    Preliminary     Procedures Procedures (including critical care time)  Medications Ordered in ED Medications  potassium chloride SA (KLOR-CON) CR tablet 40 mEq (has no administration in time range)  acetaminophen (TYLENOL) tablet 650 mg (650 mg Oral Given 07/19/19 0939)    ED Course  I have reviewed the triage vital signs and the nursing notes.  Pertinent labs & imaging results that were available during my care of the patient were reviewed by me and considered in my medical decision making (see chart for details).    MDM Rules/Calculators/A&P                          Kara Diaz is a 79 y.o. female with pertinent past medical history of arthritis, hypertension, hyperlipidemia that presents the emergency department today for left calf swelling.  Exam with tenderness and erythema with mild swelling to left calf.  Compartments soft, exam suggestive of cellulitis.  No streaking, patient is able to range leg without any difficulties.  Distally neurovascularly intact.  Initial interventions include Tylenol.  DVT study negative.  X-ray without any signs of soft tissue gas.  CBC and CMP without any acute abnormalities.  Potassium 3.2, will replete in the  ER.  WBC baseline. Patient states that she takes potassium daily and does not need a prescription.  Has not taken it today.  Patient has not also taken blood pressure medication today, denies any chest pain or shortness of breath or headache.  Patient will follow up with primary care about potassium and blood pressure and cellulitis.  Patient to be discharged home with antibiotics for cellulitis.    Doubt need for further emergent work up at this time. I explained the diagnosis and have given explicit precautions to return to the ER including for any other new or worsening symptoms. The patient understands and accepts the medical plan as it's been dictated and I have answered their questions. Discharge instructions concerning home care and prescriptions have been given. The patient is STABLE and is discharged to home in good condition.  I discussed this case with my attending physician who cosigned this note including patient's presenting symptoms, physical exam, and planned diagnostics and interventions. Attending physician stated agreement with plan or made changes to plan which were implemented.   Final Clinical Impression(s) / ED Diagnoses Final diagnoses:  Cellulitis of left lower extremity    Rx / DC Orders ED Discharge Orders         Ordered    cephALEXin (KEFLEX) 500 MG capsule  4 times daily     Discontinue  Reprint     07/19/19 1108           Alfredia Client, PA-C 07/19/19 1120    [address],  Ovid Curd, MD 07/19/19 1540

## 2019-07-19 NOTE — ED Triage Notes (Signed)
Patient c/o left calf swelling x 1 week. Patient  Called her physician and was instructed to come to the ED.

## 2019-07-22 ENCOUNTER — Encounter: Payer: Self-pay | Admitting: Family Medicine

## 2019-07-22 ENCOUNTER — Other Ambulatory Visit: Payer: Self-pay

## 2019-07-22 ENCOUNTER — Ambulatory Visit (INDEPENDENT_AMBULATORY_CARE_PROVIDER_SITE_OTHER): Payer: Medicare Other | Admitting: Family Medicine

## 2019-07-22 VITALS — BP 136/70 | HR 87 | Resp 16 | Ht 63.0 in | Wt 210.1 lb

## 2019-07-22 DIAGNOSIS — R600 Localized edema: Secondary | ICD-10-CM

## 2019-07-22 DIAGNOSIS — E876 Hypokalemia: Secondary | ICD-10-CM

## 2019-07-22 DIAGNOSIS — I499 Cardiac arrhythmia, unspecified: Secondary | ICD-10-CM

## 2019-07-22 DIAGNOSIS — I10 Essential (primary) hypertension: Secondary | ICD-10-CM

## 2019-07-22 MED ORDER — POTASSIUM CHLORIDE CRYS ER 20 MEQ PO TBCR: 20.0000 meq | EXTENDED_RELEASE_TABLET | Freq: Every day | ORAL | 0 refills | Status: DC

## 2019-07-22 NOTE — Patient Instructions (Addendum)
A few things to remember from today's visit:   Edema of left lower extremity  Resistant hypertension  Irregular heart rhythm  Complete antibiotic treatment. Compression stocking and elevations. Good skin care. Monitor blood pressure at home.  If you need refills please call your pharmacy. Do not use My Chart to request refills or for acute issues that need immediate attention.    Please be sure medication list is accurate. If a new problem present, please set up appointment sooner than planned today.

## 2019-07-22 NOTE — Progress Notes (Signed)
HPI:  [name] is a 79 y.o. female, who is here today to follow on recent ER visit. She was seen here on 06/24/2019. She was evaluated in the ER on 07/19/2019 because left lower extremity edema. She was diagnosed with cellulitis, started on cephalexin 500 mg 4 times daily x7 days. She has not noted fever, chills, change in appetite, or extremity erythema/pain.  HTN: BP was elevated last visit and during ER visit, 177/84. She is not checking BP at home. Currently she is on amlodipine-benazepril 10-40 mg and triamterene-HCTZ 37.5-25 mg daily. Hypokalemia: Currently she is on K-Dur 20 mEq daily.  Lab Results  Component Value Date   CREATININE 0.83 07/19/2019   BUN 9 07/19/2019   NA 140 07/19/2019   K 3.2 (L) 07/19/2019   CL 102 07/19/2019   CO2 29 07/19/2019   Last visit noted irregular HR. She has not had CP, dyspnea, palpitations, orthopnea, PND, or syncope. We could not do EKG due to technical difficulties with EKG machine.  Review of Systems  Constitutional: Negative for activity change and fatigue.  Respiratory: Negative for cough and wheezing.   Gastrointestinal: Negative for abdominal pain, nausea and vomiting.       No changes in bowel habits.  Genitourinary: Negative for decreased urine volume, dysuria and hematuria.  Musculoskeletal: Positive for arthralgias and gait problem.  Skin: Negative for rash and wound.  Neurological: Negative for facial asymmetry, weakness and headaches.  Psychiatric/Behavioral: Negative for confusion. The patient is nervous/anxious.   Rest see pertinent positives and negatives per HPI.  Current Outpatient Medications on File Prior to Visit  Medication Sig Dispense Refill  . amLODipine-benazepril (LOTREL) 10-40 MG capsule Take 1 capsule by mouth daily. 90 capsule 3  . aspirin EC 81 MG tablet Take 81 mg by mouth daily.    Marland Kitchen atorvastatin (LIPITOR) 40 MG tablet Take 1 tablet (40 mg total) by mouth daily. 90 tablet 3  .  cephALEXin (KEFLEX) 500 MG capsule Take 1 capsule (500 mg total) by mouth 4 (four) times daily for 7 days. 28 capsule 0  . Cholecalciferol (VITAMIN D3) 2000 UNITS TABS Take 2,000 Units by mouth daily.     . cycloSPORINE (RESTASIS) 0.05 % ophthalmic emulsion Place 1 drop into both eyes 2 (two) times daily.    Marland Kitchen L-Methylfolate-Algae-B12-B6 (METANX) 3-90.314-2-35 MG CAPS Take 1 capsule by mouth 2 (two) times daily.    Marland Kitchen triamterene-hydrochlorothiazide (DYAZIDE) 37.5-25 MG capsule Take 1 each (1 capsule total) by mouth every morning. 90 capsule 3   No current facility-administered medications on file prior to visit.   Past Medical History:  Diagnosis Date  . Angina   . Arthritis    all over  . 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 Early cataracts, bilateral   . Hemorrhoids   . History of radiation therapy 06/07/11-07/11/11   left breast 50Gy/89fx  . Hyperlipidemia    doesn't take any meds for this  . Hypertension    takes Dyazide and Lotrel daily  . Insomnia    but doesn't take anything for this  . Joint pain   . Joint swelling   . Personal history of radiation therapy 2013  . Pneumonia    hx of in 1992  . PONV (postoperative nausea and vomiting)   . Urinary frequency    No Known Allergies  Social History   Socioeconomic History  . Marital status: Single  Spouse name: Not on file  . Number of children: Not on file  . Years of education: Not on file  . Highest education level: Not on file  Occupational History  . Not on file  Tobacco Use  . Smoking status: Former Smoker    Types: Cigarettes    Quit date: 01/10/1990    Years since quitting: 29.5  . Smokeless tobacco: Never Used  . Tobacco comment: quit in 1992  Vaping Use  . Vaping Use: Never used  Substance and Sexual Activity  . Alcohol use: Yes    Comment: wine few times a week  . Drug use: No  . Sexual activity: Not Currently    Birth control/protection: Surgical  Other  Topics Concern  . Not on file  Social History Narrative  . Not on file   Social Determinants of Health   Financial Resource Strain:   . Difficulty of Paying Living Expenses:   Food Insecurity:   . Worried About Charity fundraiser in the Last Year:   . Arboriculturist in the Last Year:   Transportation Needs:   . Film/video editor (Medical):   Marland Kitchen Lack of Transportation (Non-Medical):   Physical Activity:   . Days of Exercise per Week:   . Minutes of Exercise per Session:   Stress:   . Feeling of Stress :   Social Connections:   . Frequency of Communication with Friends and Family:   . Frequency of Social Gatherings with Friends and Family:   . Attends Religious Services:   . Active Member of Clubs or Organizations:   . Attends Archivist Meetings:   Marland Kitchen Marital Status:     Vitals:   07/22/19 1547  BP: 136/70  Pulse: 87  Resp: 16  SpO2: 94%   Body mass index is 37.22 kg/m.   Physical Exam Vitals and nursing note reviewed.  Constitutional:      General: She is not in acute distress.    Appearance: She is well-developed.  HENT:     Head: Normocephalic and atraumatic.  Eyes:     Conjunctiva/sclera: Conjunctivae normal.  Cardiovascular:     Rate and Rhythm: Normal rate. Rhythm irregular.     Pulses:          Dorsalis pedis pulses are 2+ on the right side and 2+ on the left side.     Heart sounds: No murmur heard.      Comments: LLE 2+ pitting, RLE trace pitting edema. Pulmonary:     Effort: Pulmonary effort is normal. No respiratory distress.     Breath sounds: Normal breath sounds.  Abdominal:     Palpations: Abdomen is soft. There is no hepatomegaly or mass.     Tenderness: There is no abdominal tenderness.  Lymphadenopathy:     Cervical: No cervical adenopathy.  Skin:    General: Skin is warm.     Findings: No erythema or rash.  Neurological:     Mental Status: She is alert and oriented to person, place, and time.     Cranial Nerves: No  cranial nerve deficit.     Comments: Gait assisted by a cane.  Psychiatric:        Mood and Affect: Affect normal. Mood is anxious.     Comments: Well groomed, good eye contact.    ASSESSMENT AND PLAN:  Ms. [name] was seen today for follow-up.  Diagnoses and all orders for this visit:  Edema of left lower  extremity Examination today do not suggest active cellulitis. Recommend completing antibiotic treatment. We discussed some side effects. LE elevation and compression stockings recommended. Instructed about warning signs.  Irregular heart rhythm EKG today:SR,normal axis and intervals,PAC's.When compared with EKG in 11/2010  Hypokalemia Mild. For now continue K-Dur 20 mEq daily. Instructed about warning signs.   Resistant hypertension Today BP adequately controlled. Continue amlodipine-benazepril 10-40 mg daily and triamterene-HCTZ 37.5-25 mg daily. Low-salt diet also recommended. Instructed to monitor BP at home. Keep next appointment in 08/2019.  Return if symptoms worsen or fail to improve, for Keep next appt.  Franklin Baumbach G. [place], MD  [facility]. [place] office.   A few things to remember from today's visit:   Edema of left lower extremity  Resistant hypertension  Irregular heart rhythm  Complete antibiotic treatment. Compression stocking and elevations. Good skin care. Monitor blood pressure at home.  If you need refills please call your pharmacy. Do not use My Chart to request refills or for acute issues that need immediate attention.    Please be sure medication list is accurate. If a new problem present, please set up appointment sooner than planned today.

## 2019-07-22 NOTE — Assessment & Plan Note (Signed)
Mild. For now continue K-Dur 20 mEq daily. Instructed about warning signs.

## 2019-07-22 NOTE — Assessment & Plan Note (Signed)
Today BP adequately controlled. Continue amlodipine-benazepril 10-40 mg daily and triamterene-HCTZ 37.5-25 mg daily. Low-salt diet also recommended. Instructed to monitor BP at home. Keep next appointment in 08/2019.

## 2019-07-26 ENCOUNTER — Telehealth: Payer: Self-pay | Admitting: Family Medicine

## 2019-07-26 NOTE — Telephone Encounter (Signed)
Pt call and want a refill on cephALEXin (KEFLEX) 500 MG capsule and want to know it dr.Jordan want her to continue to take it.

## 2019-07-26 NOTE — Telephone Encounter (Signed)
I spoke with patient. We went over the information below & she verbalized understanding. She will let us know if her leg doesn't feel better.

## 2019-07-26 NOTE — Telephone Encounter (Signed)
This is one time treatment for infection. When I saw her recently,ER f/u,  I explained that I did not think abx were needed but still recommended completing treatment. Based on my evaluation last visit, more abx is not necessary at this time. Thanks, BJ

## 2019-08-19 NOTE — Patient Instructions (Addendum)
A few things to remember from today's visit:  Hypokalemia - Plan: Potassium  Resistant hypertension  Pain of left lower extremity - Plan: Ambulatory referral to Vascular Surgery  Lower extremity edema - Plan: Ambulatory referral to Vascular Surgery  Blood pressure today elevated, 180/80 x 2. No changes today, if you change up your mind about adding Metoprolol please let me know. Continue monitoring blood pressure and keep appt with cardiologist.  Potassium checked today.  Leg pain I do not think it is cellulitis. Monitor for changes after completing Doxycycline. LE elevation a few times during the day. Compression stocking will help with edema and may help with pain.  If you need refills please call your pharmacy. Do not use My Chart to request refills or for acute issues that need immediate attention.    Please be sure medication list is accurate. If a new problem present, please set up appointment sooner than planned today.        If we have ordered labs or studies at this visit, it can take up to 1-2 weeks for results and processing. IF results require follow up or explanation, we will call you with instructions. Clinically stable results will be released to your [facility]. If you have not heard from [place] or cannot find your results in [facility] in 2 weeks please contact our office at [phone].  If you are not yet signed up for [facility], please consider signing up.

## 2019-08-20 ENCOUNTER — Encounter: Payer: Self-pay | Admitting: Family Medicine

## 2019-08-20 ENCOUNTER — Other Ambulatory Visit: Payer: Self-pay

## 2019-08-20 ENCOUNTER — Ambulatory Visit (INDEPENDENT_AMBULATORY_CARE_PROVIDER_SITE_OTHER): Payer: Medicare Other | Admitting: Family Medicine

## 2019-08-20 VITALS — BP 130/70 | HR 71 | Temp 98.1°F | Resp 16 | Ht 63.0 in | Wt 207.0 lb

## 2019-08-20 DIAGNOSIS — I10 Essential (primary) hypertension: Secondary | ICD-10-CM

## 2019-08-20 DIAGNOSIS — M79605 Pain in left leg: Secondary | ICD-10-CM | POA: Diagnosis not present

## 2019-08-20 DIAGNOSIS — E876 Hypokalemia: Secondary | ICD-10-CM | POA: Diagnosis not present

## 2019-08-20 DIAGNOSIS — R600 Localized edema: Secondary | ICD-10-CM | POA: Diagnosis not present

## 2019-08-20 MED ORDER — DOXYCYCLINE HYCLATE 100 MG PO TABS: 100.0000 mg | ORAL_TABLET | Freq: Two times a day (BID) | ORAL | 0 refills | Status: AC

## 2019-08-20 NOTE — Assessment & Plan Note (Signed)
Problem is not well controlled. No changes in K+ supplementation,it will be adjusted if needed. Side effects of diuretics discussed.

## 2019-08-20 NOTE — Assessment & Plan Note (Addendum)
Not well controlled. She does not want to add a new medications today. She has an appt with her cardiologist in 09/2019. Possible complications of elevated BP discussed. Clearly instructed about warning signs.

## 2019-08-20 NOTE — Assessment & Plan Note (Addendum)
LE VAS [place] negative for DVT. ? Vein insufficiency. Tibia/fibula X ray negative for bone abnormalities. LE elevation a few times in the afternoon. Vascular evaluation will be arranged.

## 2019-08-20 NOTE — Assessment & Plan Note (Addendum)
We discussed possible etiologies. I do not think it is an infectious process.  She would like to have another round of abx. Side effects discussed.

## 2019-08-20 NOTE — Progress Notes (Addendum)
HPI: [name] is a 79 y.o. female, who is here today for establishing care. She was last seen on 07/22/19.  She is concerned about "cellulitis." She was treated with Keflex 500 mg qid x 10 days in the ER (07/19/19) mild improvement of pain but still edematous and with pigmentation changes.  She tried compression stocking but it caused pain when rubbing skin. In the past she has had same symptoms on RLE and was on abx for 3 weeks, finally resolved. She has not noted erythema. She started taking Furosemide ? Mg 1/2 tab daily.  07/19/19 LE [place]: RIGHT: No evidence of common femoral vein obstruction.   LEFT:  - There is no evidence of deep vein thrombosis in the lower extremity.  - No cystic structure found in the popliteal fossa.   Pain is sharp pain, throbbing sometimes,8/10. She is taking Acetaminophen. Pain is interfering with sleep.  Pain has improved.  Negative for fever,chills,unusual fatigue,or changes in appetite.  Bilateral LE edema is greatly improve in the morning and worse at the end of the day. Negative for orthopnea, PND, decreased urine output.  HTN:  She is not checking BP at home. Her BP monitor was reading "error." She is on Triamterene-HCTZ 37.5-25 mg daily and Amlodipine-Benazepril 10-40 mg daily.  Negative for unusual headache,chest pain, dyspnea, palpitation, and focal weakness.  HypoK+, she is on KLOR 20 meq daily.  Lab Results  Component Value Date   CREATININE 0.83 07/19/2019   BUN 9 07/19/2019   NA 140 07/19/2019   K 3.9 08/20/2019   CL 102 07/19/2019   CO2 29 07/19/2019   Review of Systems  Constitutional: Negative for activity change and unexpected weight change.  HENT: Negative for mouth sores, nosebleeds and sore throat.   Eyes: Negative for redness and visual disturbance.  Respiratory: Negative for cough and wheezing.   Gastrointestinal: Negative for abdominal pain, nausea and vomiting.       Negative for changes in bowel  habits.  Genitourinary: Negative for dysuria and hematuria.  Neurological: Negative for syncope, facial asymmetry and weakness.  Psychiatric/Behavioral: Negative for confusion. The patient is nervous/anxious.   Rest of ROS, see pertinent positives sand negatives in HPI  Current Outpatient Medications on File Prior to Visit  Medication Sig Dispense Refill  . amLODipine-benazepril (LOTREL) 10-40 MG capsule Take 1 capsule by mouth daily. 90 capsule 3  . aspirin EC 81 MG tablet Take 81 mg by mouth daily.    Marland Kitchen atorvastatin (LIPITOR) 40 MG tablet Take 1 tablet (40 mg total) by mouth daily. 90 tablet 3  . CALCIUM PO Take by mouth. Take 1 tablet by mouth.    . Cholecalciferol (VITAMIN D3) 2000 UNITS TABS Take 2,000 Units by mouth daily.     . cycloSPORINE (RESTASIS) 0.05 % ophthalmic emulsion Place 1 drop into both eyes 2 (two) times daily.    . FUROSEMIDE PO Take by mouth. 1/2 tablet by mouth for leg swelling.    Marland Kitchen L-Methylfolate-Algae-B12-B6 (METANX) 3-90.314-2-35 MG CAPS Take 1 capsule by mouth 2 (two) times daily.    Marland Kitchen MAGNESIUM PO Take by mouth. Take 1 tablet by mouth daily.    . Multiple Vitamin (MULTIVITAMIN) tablet Take 1 tablet by mouth daily.    . potassium chloride SA (KLOR-CON) 20 MEQ tablet Take 1 tablet (20 mEq total) by mouth daily. 90 tablet 0  . triamterene-hydrochlorothiazide (DYAZIDE) 37.5-25 MG capsule Take 1 each (1 capsule total) by mouth every morning. 90 capsule 3  No current facility-administered medications on file prior to visit.   Past Medical History:  Diagnosis Date  . Angina   . Arthritis    all over  . 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 Early cataracts, bilateral   . Hemorrhoids   . History of radiation therapy 06/07/11-07/11/11   left breast 50Gy/29fx  . Hyperlipidemia    doesn't take any meds for this  . Hypertension    takes Dyazide and Lotrel daily  . Insomnia    but doesn't take anything for this  . Joint  pain   . Joint swelling   . Personal history of radiation therapy 2013  . Pneumonia    hx of in 1992  . PONV (postoperative nausea and vomiting)   . Urinary frequency    No Known Allergies  Social History   Socioeconomic History  . Marital status: Single    Spouse name: Not on file  . Number of children: Not on file  . Years of education: Not on file  . Highest education level: Not on file  Occupational History  . Not on file  Tobacco Use  . Smoking status: Former Smoker    Types: Cigarettes    Quit date: 01/10/1990    Years since quitting: 29.6  . Smokeless tobacco: Never Used  . Tobacco comment: quit in 1992  Vaping Use  . Vaping Use: Never used  Substance and Sexual Activity  . Alcohol use: Yes    Comment: wine few times a week  . Drug use: No  . Sexual activity: Not Currently    Birth control/protection: Surgical  Other Topics Concern  . Not on file  Social History Narrative  . Not on file   Social Determinants of Health   Financial Resource Strain:   . Difficulty of Paying Living Expenses:   Food Insecurity:   . Worried About Charity fundraiser in the Last Year:   . Arboriculturist in the Last Year:   Transportation Needs:   . Film/video editor (Medical):   Marland Kitchen Lack of Transportation (Non-Medical):   Physical Activity:   . Days of Exercise per Week:   . Minutes of Exercise per Session:   Stress:   . Feeling of Stress :   Social Connections:   . Frequency of Communication with Friends and Family:   . Frequency of Social Gatherings with Friends and Family:   . Attends Religious Services:   . Active Member of Clubs or Organizations:   . Attends Archivist Meetings:   Marland Kitchen Marital Status:    Vitals:   08/20/19 1130  BP: 130/70  Pulse: 71  Resp: 16  Temp: 98.1 F (36.7 C)  SpO2: 95%   Body mass index is 36.67 kg/m.  Physical Exam Vitals and nursing note reviewed.  Constitutional:      General: She is not in acute distress.     Appearance: She is well-developed.  HENT:     Head: Normocephalic and atraumatic.     Mouth/Throat:     Mouth: Mucous membranes are moist.     Pharynx: Oropharynx is clear.  Eyes:     Conjunctiva/sclera: Conjunctivae normal.     Pupils: Pupils are equal, round, and reactive to light.  Cardiovascular:     Rate and Rhythm: Normal rate and regular rhythm.     Pulses:          Dorsalis pedis  pulses are 2+ on the right side and 2+ on the left side.     Heart sounds: Murmur (SEM I/VI RUSB) heard.      Comments: Trace pitting RLE edema. Pulmonary:     Effort: Pulmonary effort is normal. No respiratory distress.     Breath sounds: Normal breath sounds.  Abdominal:     Palpations: Abdomen is soft. There is no hepatomegaly or mass.     Tenderness: There is no abdominal tenderness.  Musculoskeletal:     Left lower leg: 1+ Edema (Tender with pressing pretibial area and calf pain with foot dorsi flexion.) present.  Lymphadenopathy:     Cervical: No cervical adenopathy.  Skin:    General: Skin is warm.     Findings: No erythema or rash.  Neurological:     Mental Status: She is alert and oriented to person, place, and time.     Cranial Nerves: No cranial nerve deficit.     Gait: Gait normal.  Psychiatric:     Comments: Well groomed, good eye contact.   ASSESSMENT AND PLAN:  Ms. Kara Diaz was seen today for establishing   Orders Placed This Encounter  Procedures  . Potassium  . Ambulatory referral to Vascular Surgery    Hypokalemia Problem is not well controlled. No changes in K+ supplementation,it will be adjusted if needed. Side effects of diuretics discussed.  Pain of left lower extremity We discussed possible etiologies. I do not think it is an infectious process.  She would like to have another round of abx. Side effects discussed.  Resistant hypertension Not well controlled. She does not want to add a new medications today. She has an appt with her  cardiologist in 09/2019. Possible complications of elevated BP discussed. Clearly instructed about warning signs.   Lower extremity edema LE VAS [place] negative for DVT. ? Vein insufficiency. Tibia/fibula X ray negative for bone abnormalities. LE elevation a few times in the afternoon. Vascular evaluation will be arranged.   Spent 41 minutes with pt dedicated to obtaining Hx,performing examination,documenting, reviewing recent labs/imaging,and discussion of plan. Discussion of differential Dx's and side effects of medications.   Return in about 4 months (around 12/20/2019).   Rubi Tooley G. [place], MD  [facility]. [place] office.   A few things to remember from today's visit:  Hypokalemia - Plan: Potassium  Resistant hypertension  Pain of left lower extremity - Plan: Ambulatory referral to Vascular Surgery  Lower extremity edema - Plan: Ambulatory referral to Vascular Surgery  Blood pressure today elevated, 180/80 x 2. No changes today, if you change up your mind about adding Metoprolol please let me know. Continue monitoring blood pressure and keep appt with cardiologist.  Potassium checked today.  Leg pain I do not think it is cellulitis. Monitor for changes after completing Doxycycline. LE elevation a few times during the day. Compression stocking will help with edema and may help with pain.  If you need refills please call your pharmacy. Do not use My Chart to request refills or for acute issues that need immediate attention.    Please be sure medication list is accurate. If a new problem present, please set up appointment sooner than planned today.  If we have ordered labs or studies at this visit, it can take up to 1-2 weeks for results and processing. IF results require follow up or explanation, we will call you with instructions. Clinically stable results will be released to your [facility]. If you have not heard  from [place] or cannot find your results in  [facility] in 2 weeks please contact our office at [phone].  If you are not yet signed up for [facility], please consider signing up.

## 2019-08-21 LAB — POTASSIUM: Potassium: 3.9 mmol/L (ref 3.5–5.3)

## 2019-08-22 ENCOUNTER — Encounter: Payer: Self-pay | Admitting: Family Medicine

## 2019-08-23 ENCOUNTER — Ambulatory Visit: Payer: PRIVATE HEALTH INSURANCE | Admitting: Family Medicine

## 2019-10-10 ENCOUNTER — Other Ambulatory Visit: Payer: Self-pay

## 2019-10-10 DIAGNOSIS — M7989 Other specified soft tissue disorders: Secondary | ICD-10-CM

## 2019-10-29 ENCOUNTER — Other Ambulatory Visit: Payer: Self-pay | Admitting: Family Medicine

## 2019-10-29 DIAGNOSIS — E876 Hypokalemia: Secondary | ICD-10-CM

## 2019-10-30 ENCOUNTER — Ambulatory Visit (INDEPENDENT_AMBULATORY_CARE_PROVIDER_SITE_OTHER): Payer: Medicare Other | Admitting: Vascular Surgery

## 2019-10-30 ENCOUNTER — Encounter: Payer: Self-pay | Admitting: Vascular Surgery

## 2019-10-30 ENCOUNTER — Ambulatory Visit (HOSPITAL_COMMUNITY)
Admission: RE | Admit: 2019-10-30 | Discharge: 2019-10-30 | Disposition: A | Discharge status: Home / Self Care | Payer: Medicare Other | Source: Ambulatory Visit | Attending: Vascular Surgery | Admitting: Vascular Surgery

## 2019-10-30 ENCOUNTER — Other Ambulatory Visit: Payer: Self-pay

## 2019-10-30 VITALS — BP 161/75 | HR 65 | Temp 97.9°F | Resp 18 | Ht 62.5 in | Wt 198.3 lb

## 2019-10-30 DIAGNOSIS — M7989 Other specified soft tissue disorders: Secondary | ICD-10-CM | POA: Insufficient documentation

## 2019-10-30 DIAGNOSIS — I83812 Varicose veins of left lower extremities with pain: Secondary | ICD-10-CM | POA: Diagnosis not present

## 2019-10-30 NOTE — Progress Notes (Signed)
Patient name: Kara Diaz MRN: [id number] DOB: November 11, 1940 Sex: female  REASON FOR CONSULT: Recurrent cellulitis left leg  HPI: Kamali Sakata is a 79 y.o. female, who has had multiple recurrence of cellulitis in her left leg over the last few months.  She does not recall any portal of entry for infection.  She has had multiple courses of antibiotics.  She has no prior history of DVT.  He has no family history of varicose veins.  She has worn some compression stockings but this was about 20 years ago and has not worn them recently.  She was seen in the emergency room as recently as June of this year.  She has been on antibiotics intermittently in July and August.  Other medical problems include hypertension.  She also has degenerative joint disease and has had bilateral knee replacement and walks with a cane.  Past Medical History:  Diagnosis Date  . Angina   . Arthritis    all over  . 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 Early cataracts, bilateral   . Hemorrhoids   . History of radiation therapy 06/07/11-07/11/11   left breast 50Gy/7fx  . Hyperlipidemia    doesn't take any meds for this  . Hypertension    takes Dyazide and Lotrel daily  . Insomnia    but doesn't take anything for this  . Joint pain   . Joint swelling   . Personal history of radiation therapy 2013  . Pneumonia    hx of in 1992  . PONV (postoperative nausea and vomiting)   . Urinary frequency    Past Surgical History:  Procedure Laterality Date  . ABDOMINAL HYSTERECTOMY  1980   partial hysterectomy  . BREAST BIOPSY Left 03/25/2011  . BREAST LUMPECTOMY Left 05/09/2011   left breast lumpectomy  . CARDIAC CATHETERIZATION  12/16/10  . CHOLECYSTECTOMY  late 34's  . COLONOSCOPY    . JOINT REPLACEMENT Right 11/2011   knee replacement  . knee replacement surgery    . MYOMECTOMY     early 34's  . rotator cuff surgery  1998   right  . TONSILLECTOMY      as a child    Family History  Problem Relation Age of Onset  . Hypertension Mother   . Hypertension Father   . Diabetes Father   . Cancer Brother        prostate  . Stroke Brother   . Cancer Cousin   . Cancer Cousin   . Malignant hyperthermia Neg Hx   . Hypotension Neg Hx   . Pseudochol deficiency Neg Hx     SOCIAL HISTORY: Social History   Socioeconomic History  . Marital status: Single    Spouse name: Not on file  . Number of children: Not on file  . Years of education: Not on file  . Highest education level: Not on file  Occupational History  . Not on file  Tobacco Use  . Smoking status: Former Smoker    Types: Cigarettes    Quit date: 01/10/1990    Years since quitting: 29.8  . Smokeless tobacco: Never Used  . Tobacco comment: quit in 1992  Vaping Use  . Vaping Use: Never used  Substance and Sexual Activity  . Alcohol use: Yes    Comment: wine few times a week  . Drug use: No  . Sexual activity: Not Currently    Birth control/protection: Surgical  Other Topics Concern  . Not on file  Social History Narrative  . Not on file   Social Determinants of Health   Financial Resource Strain:   . Difficulty of Paying Living Expenses: Not on file  Food Insecurity:   . Worried About Charity fundraiser in the Last Year: Not on file  . Ran Out of Food in the Last Year: Not on file  Transportation Needs:   . Lack of Transportation (Medical): Not on file  . Lack of Transportation (Non-Medical): Not on file  Physical Activity:   . Days of Exercise per Week: Not on file  . Minutes of Exercise per Session: Not on file  Stress:   . Feeling of Stress : Not on file  Social Connections:   . Frequency of Communication with Friends and Family: Not on file  . Frequency of Social Gatherings with Friends and Family: Not on file  . Attends Religious Services: Not on file  . Active Member of Clubs or Organizations: Not on file  . Attends Archivist Meetings: Not on  file  . Marital Status: Not on file  Intimate Partner Violence:   . Fear of Current or Ex-Partner: Not on file  . Emotionally Abused: Not on file  . Physically Abused: Not on file  . Sexually Abused: Not on file    No Known Allergies  Current Outpatient Medications  Medication Sig Dispense Refill  . amLODipine-benazepril (LOTREL) 10-40 MG capsule Take 1 capsule by mouth daily. 90 capsule 3  . aspirin EC 81 MG tablet Take 81 mg by mouth daily.    Marland Kitchen atorvastatin (LIPITOR) 40 MG tablet Take 1 tablet (40 mg total) by mouth daily. 90 tablet 3  . Cholecalciferol (VITAMIN D3) 2000 UNITS TABS Take 2,000 Units by mouth daily.     . cycloSPORINE (RESTASIS) 0.05 % ophthalmic emulsion Place 1 drop into both eyes 2 (two) times daily.    . FUROSEMIDE PO Take by mouth. 1/2 tablet by mouth for leg swelling.    Marland Kitchen KLOR-CON M20 20 MEQ tablet TAKE 1 TABLET DAILY 90 tablet 3  . L-Methylfolate-Algae-B12-B6 (METANX) 3-90.314-2-35 MG CAPS Take 1 capsule by mouth 2 (two) times daily.    Marland Kitchen triamterene-hydrochlorothiazide (DYAZIDE) 37.5-25 MG capsule Take 1 each (1 capsule total) by mouth every morning. 90 capsule 3  . CALCIUM PO Take by mouth. Take 1 tablet by mouth. (Patient not taking: Reported on 10/30/2019)    . MAGNESIUM PO Take by mouth. Take 1 tablet by mouth daily. (Patient not taking: Reported on 10/30/2019)    . Multiple Vitamin (MULTIVITAMIN) tablet Take 1 tablet by mouth daily. (Patient not taking: Reported on 10/30/2019)     No current facility-administered medications for this visit.    ROS:   General:  No weight loss, Fever, chills  HEENT: No recent headaches, no nasal bleeding, no visual changes, no sore throat  Neurologic: No dizziness, blackouts, seizures. No recent symptoms of stroke or mini- stroke. No recent episodes of slurred speech, or temporary blindness.  Cardiac: No recent episodes of chest pain/pressure, no shortness of breath at rest.  No shortness of breath with exertion.   Denies history of atrial fibrillation or irregular heartbeat  Vascular: No history of rest pain in feet.  No history of claudication.  No history of non-healing ulcer, No history of DVT   Pulmonary: No home oxygen, no productive cough, no hemoptysis,  No asthma or wheezing  Musculoskeletal:  [X]  Arthritis, [ ]  Low  back pain,  [X]  Joint pain  Hematologic:No history of hypercoagulable state.  No history of easy bleeding.  No history of anemia  Gastrointestinal: No hematochezia or melena,  No gastroesophageal reflux, no trouble swallowing  Urinary: [ ]  chronic Kidney disease, [ ]  on HD - [ ]  MWF or [ ]  TTHS, [ ]  Burning with urination, [ ]  Frequent urination, [ ]  Difficulty urinating;   Skin: No rashes  Psychological: No history of anxiety,  No history of depression   Physical Examination  Vitals:   10/30/19 1531  BP: (!) 161/75  Pulse: 65  Resp: 18  Temp: 97.9 F (36.6 C)  TempSrc: Temporal  SpO2: 100%  Weight: 198 lb 4.8 oz (89.9 kg)  Height: 5' 2.5" (1.588 m)    Body mass index is 35.69 kg/m.  General:  Alert and oriented, no acute distress HEENT: Normal Neck: No JVD Cardiac: Regular Rate and Rhythm Skin: No rash, thickened slightly warm skin with mild peau d'orange extending from the knee down to the ankle left leg Extremity Pulses:  2+ radial, brachial, femoral, dorsalis pedis pulses bilaterally Musculoskeletal: Thickened chronic type edema from knee to ankle left leg  Neurologic: Upper and lower extremity motor 5/5 and symmetric  DATA:  She had a venous reflux exam today which showed no significant reflux in the greater saphenous vein.  She did have reflux in the left lesser saphenous vein with a 5 to 7 mm vein diameter.  Also prominent lymph nodes in the left groin.  Reviewed and interpreted the study.  ASSESSMENT: Symptomatic venous reflux from the left lesser saphenous vein probably contributing to recurrent cellulitis left leg.   PLAN: Was given a new  compression stocking fitting today and recommendation that she wear these compression stockings every day during the day.  She will also elevate her legs when she is not walking.  She will follow up with [place] in 3 months time for consideration of laser ablation of the left lesser saphenous vein if symptoms are not significantly improved.  She does have some complaints in her right leg and may need a venous reflux exam in the right leg at some point in future if her symptoms worsen.   Ruta Hinds, MD [facility] Office: [phone]

## 2019-11-06 DIAGNOSIS — M7989 Other specified soft tissue disorders: Secondary | ICD-10-CM

## 2019-11-13 ENCOUNTER — Telehealth: Payer: Self-pay

## 2019-11-13 NOTE — Telephone Encounter (Signed)
Patient called with c/o pain & some swelling in her legs. Hx of venous insufficiency. Instructed to elevate legs, wear compression stockings and try tylenol for pain. Patient says it is difficult to elevate her legs, but she will try. Instructed to call back if she has further problems or questions.

## 2019-11-13 NOTE — Telephone Encounter (Signed)
Patient left VM asking what to take for leg pain - she saw Dr. Oneida Alar on 10/30/19 on his vein day. Attempted to call both numbers she left and could not get through.

## 2019-12-23 ENCOUNTER — Other Ambulatory Visit: Payer: Self-pay | Admitting: Cardiology

## 2019-12-23 DIAGNOSIS — Z1231 Encounter for screening mammogram for malignant neoplasm of breast: Secondary | ICD-10-CM

## 2019-12-27 ENCOUNTER — Ambulatory Visit (INDEPENDENT_AMBULATORY_CARE_PROVIDER_SITE_OTHER): Payer: Medicare Other | Admitting: Family Medicine

## 2019-12-27 ENCOUNTER — Other Ambulatory Visit: Payer: Self-pay

## 2019-12-27 ENCOUNTER — Encounter: Payer: Self-pay | Admitting: Family Medicine

## 2019-12-27 VITALS — BP 160/80 | HR 64 | Temp 98.1°F | Resp 16 | Ht 62.5 in | Wt 197.2 lb

## 2019-12-27 DIAGNOSIS — M79605 Pain in left leg: Secondary | ICD-10-CM | POA: Diagnosis not present

## 2019-12-27 DIAGNOSIS — I10 Essential (primary) hypertension: Secondary | ICD-10-CM

## 2019-12-27 DIAGNOSIS — M359 Systemic involvement of connective tissue, unspecified: Secondary | ICD-10-CM | POA: Insufficient documentation

## 2019-12-27 DIAGNOSIS — E876 Hypokalemia: Secondary | ICD-10-CM | POA: Diagnosis not present

## 2019-12-27 DIAGNOSIS — R634 Abnormal weight loss: Secondary | ICD-10-CM

## 2019-12-27 DIAGNOSIS — Z780 Asymptomatic menopausal state: Secondary | ICD-10-CM

## 2019-12-27 NOTE — Patient Instructions (Addendum)
A few things to remember from today's visit:   Abnormal weight loss - Plan: CBC with Differential/Platelet, COMPLETE METABOLIC PANEL WITH GFR, Protein Electrophoresis, Urine Rflx., C-reactive protein, DG Chest 2 View  Systemic involvement of connective tissue, unspecified (HCC)  Hypercalcemia - Plan: CBC with Differential/Platelet, COMPLETE METABOLIC PANEL WITH GFR, Calcium, ionized, Protein Electrophoresis, Urine Rflx., C-reactive protein  Hypokalemia  Resistant hypertension  Asymptomatic postmenopausal estrogen deficiency - Plan: DG Bone Density  If you need refills please call your pharmacy. Do not use My Chart to request refills or for acute issues that need immediate attention.   Gabapentin may help with sleep and leg pain. Depending of lab results we will decide next step for wt loss.  Please be sure medication list is accurate. If a new problem present, please set up appointment sooner than planned today.

## 2019-12-27 NOTE — Progress Notes (Signed)
HPI: Kara Diaz is a 79 y.o. female, who is here today for 4 months follow up.   She was last seen on 08/20/19.  She has noted unintended wt loss since 07/2019, wt usually 208 Lb. She has not had a colonoscopy in 12 years. Negative for blood in stool, melena, or gross hematuria.  Lab Results  Component Value Date   TSH 0.73 06/26/2019   Lab Results  Component Value Date   WBC 3.4 (L) 07/19/2019   HGB 13.2 07/19/2019   HCT 41.3 07/19/2019   MCV 99.5 07/19/2019   PLT 264 07/19/2019   Lab Results  Component Value Date   ALT 20 07/19/2019   AST 26 07/19/2019   ALKPHOS 100 07/19/2019   BILITOT 1.2 07/19/2019   She is also upset because elevated Ca++. States that this was the reason she was here last time and "nothing" was done. 06/26/19 this problem was addressed. This is a chronic problem and seems to be stable. 25 OH vit D 55, iPTH normal at 44, and Ca++ 10.6 (11.3). K+ was 3.0.  DEXA in 07/2017: Osteopenia. Major Osteoporotic Fracture: 4.8% Hip Fracture:                0.9%   HTN: Today BP is elevated. She is on Triamterene-HCTZ 37.5-25 mg daily and Amlodipine-benazepril 10-40 mg daily. She is not checking BP at home. BP during OV about 3 weeks ago was 144/78.  Negative for severe/frequent headache, visual changes, chest pain, dyspnea, palpitation,or focal weakness. HypoK+: She is on KLOR 20 meq daily.  Lab Results  Component Value Date   CREATININE 0.83 07/19/2019   BUN 9 07/19/2019   NA 140 07/19/2019   K 3.9 08/20/2019   CL 102 07/19/2019   CO2 29 07/19/2019   LLE pain. No back pain. She has seen vascular. Frustrated because she is still having pain. Pain is like a toothache, day and night. Pain is 5-6/10.  Shooting calf pain, intermittently. Pain does not interfere with sleep, she is already awake when she notices pain. +Insomnia for years. LE edema has improved. She is taking Furosemide 40 mg 1/2 tab daily. She uses a cane to  assist with gait because OA.  Review of Systems  Constitutional: Negative for activity change, appetite change, fatigue and fever.  HENT: Negative for mouth sores, nosebleeds and sore throat.   Respiratory: Negative for cough and wheezing.   Gastrointestinal: Negative for abdominal pain, nausea and vomiting.       Negative for changes in bowel habits.  Genitourinary: Negative for decreased urine volume, dysuria and hematuria.  Musculoskeletal: Positive for arthralgias and gait problem.  Skin: Negative for pallor and rash.  Neurological: Negative for syncope and facial asymmetry.  Psychiatric/Behavioral: Positive for sleep disturbance. Negative for confusion and hallucinations. The patient is nervous/anxious.   Rest of ROS, see pertinent positives sand negatives in HPI  Current Outpatient Medications on File Prior to Visit  Medication Sig Dispense Refill  . amLODipine-benazepril (LOTREL) 10-40 MG capsule Take 1 capsule by mouth daily. 90 capsule 3  . aspirin EC 81 MG tablet Take 81 mg by mouth daily.    Marland Kitchen atorvastatin (LIPITOR) 40 MG tablet Take 1 tablet (40 mg total) by mouth daily. 90 tablet 3  . CALCIUM PO Take by mouth. Take 1 tablet by mouth. (Patient not taking: Reported on 10/30/2019)    . Cholecalciferol (VITAMIN D3) 2000 UNITS TABS Take 2,000 Units by mouth daily.     Marland Kitchen  cycloSPORINE (RESTASIS) 0.05 % ophthalmic emulsion Place 1 drop into both eyes 2 (two) times daily.    . FUROSEMIDE PO Take by mouth. 1/2 tablet by mouth for leg swelling.    Marland Kitchen KLOR-CON M20 20 MEQ tablet TAKE 1 TABLET DAILY 90 tablet 3  . L-Methylfolate-Algae-B12-B6 (METANX) 3-90.314-2-35 MG CAPS Take 1 capsule by mouth 2 (two) times daily.    Marland Kitchen MAGNESIUM PO Take by mouth. Take 1 tablet by mouth daily. (Patient not taking: Reported on 10/30/2019)    . Multiple Vitamin (MULTIVITAMIN) tablet Take 1 tablet by mouth daily. (Patient not taking: Reported on 10/30/2019)    . triamterene-hydrochlorothiazide (DYAZIDE)  37.5-25 MG capsule Take 1 each (1 capsule total) by mouth every morning. 90 capsule 3   No current facility-administered medications on file prior to visit.     Past Medical History:  Diagnosis Date  . Angina   . Arthritis    all over  . 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 Early cataracts, bilateral   . Hemorrhoids   . History of radiation therapy 06/07/11-07/11/11   left breast 50Gy/87f  . Hyperlipidemia    doesn't take any meds for this  . Hypertension    takes Dyazide and Lotrel daily  . Insomnia    but doesn't take anything for this  . Joint pain   . Joint swelling   . Personal history of radiation therapy 2013  . Pneumonia    hx of in 1992  . PONV (postoperative nausea and vomiting)   . Urinary frequency    No Known Allergies  Social History   Socioeconomic History  . Marital status: Single    Spouse name: Not on file  . Number of children: Not on file  . Years of education: Not on file  . Highest education level: Not on file  Occupational History  . Not on file  Tobacco Use  . Smoking status: Former Smoker    Types: Cigarettes    Quit date: 01/10/1990    Years since quitting: 30.0  . Smokeless tobacco: Never Used  . Tobacco comment: quit in 1992  Vaping Use  . Vaping Use: Never used  Substance and Sexual Activity  . Alcohol use: Yes    Comment: wine few times a week  . Drug use: No  . Sexual activity: Not Currently    Birth control/protection: Surgical  Other Topics Concern  . Not on file  Social History Narrative  . Not on file   Social Determinants of Health   Financial Resource Strain: Not on file  Food Insecurity: Not on file  Transportation Needs: Not on file  Physical Activity: Not on file  Stress: Not on file  Social Connections: Not on file    Vitals:   12/27/19 1105  BP: (!) 160/80  Pulse: 64  Resp: 16  Temp: 98.1 F (36.7 C)  SpO2: 100%   Body mass index is 35.49  kg/m.  Physical Exam Vitals and nursing note reviewed.  Constitutional:      General: She is not in acute distress.    Appearance: She is well-developed.  HENT:     Head: Normocephalic and atraumatic.     Mouth/Throat:     Mouth: Oropharynx is clear and moist and mucous membranes are normal.  Eyes:     Conjunctiva/sclera: Conjunctivae normal.     Pupils: Pupils are equal, round, and reactive to light.  Cardiovascular:  Rate and Rhythm: Normal rate and regular rhythm.     Pulses:          Dorsalis pedis pulses are 2+ on the right side and 2+ on the left side.     Heart sounds: No murmur heard.   Pulmonary:     Effort: Pulmonary effort is normal. No respiratory distress.     Breath sounds: Normal breath sounds.  Abdominal:     Palpations: Abdomen is soft. There is no hepatomegaly or mass.     Tenderness: There is no abdominal tenderness.  Musculoskeletal:        General: No edema.  Lymphadenopathy:     Cervical: No cervical adenopathy.  Skin:    General: Skin is warm.     Findings: No erythema or rash.  Neurological:     Mental Status: She is alert and oriented to person, place, and time.     Cranial Nerves: No cranial nerve deficit.     Deep Tendon Reflexes: Strength normal.     Comments: Gait is unstable/antalgic. Assisted by a cane.  Psychiatric:        Mood and Affect: Mood is anxious.     Comments: Well groomed, good eye contact.   ASSESSMENT AND PLAN:  Kara Diaz was seen today for 4 months follow-up.  Orders Placed This Encounter  Procedures  . DG Bone Density  . DG Chest 2 View  . Protein Electrophoresis, Urine Rflx.  . Calcium, ionized  . CBC with Differential/Platelet  . CMP with eGFR([facility])  . C-reactive Protein  . Vitamin D, 1,25-dihydroxy    Abnormal weight loss We discussed possible etiologies, including the risk of malignancy. Hx of breast cancer, mammogram in 01/2020. She does not want colonoscopy done for now. F/U in 4  weeks.  Pain of left lower extremity We discussed possible etiologies. Vein disease, ? Radiculopathy. She is not interested in trying Gabapentin, which may also help with sleep. Good skin care.  Hypercalcemia Problem is chronic, Ca++ elevated intermittently since 07/2012 (10.8). Asymptomatic. Endocrinologist evaluation offered, she prefers to wait for now. Instructed about warning signs. Further recommendations according to lab results.  Hypokalemia Continue KLOR 20 meq daily. Diuretics side effects discussed.  Resistant hypertension BP is not well controlled. We discussed possible complications of elevated BP. Recommend checking BP at home for a few days before we decide to add medications. Low salt diet. Instructed about warning signs.  Asymptomatic postmenopausal estrogen deficiency -     DG Bone Density; Future   Spent 51 minutes.  During this time history was obtained and documented, examination was performed, prior labs/imaging reviewed, and assessment/plan discussed.  Return in about 4 weeks (around 01/24/2020) for HTN and wt..   Kara Demello G. Martinique, MD  [facility]. [place] office.   A few things to remember from today's visit:   Abnormal weight loss - Plan: CBC with Differential/Platelet, COMPLETE METABOLIC PANEL WITH GFR, Protein Electrophoresis, Urine Rflx., C-reactive protein, DG Chest 2 View  Systemic involvement of connective tissue, unspecified (HCC)  Hypercalcemia - Plan: CBC with Differential/Platelet, COMPLETE METABOLIC PANEL WITH GFR, Calcium, ionized, Protein Electrophoresis, Urine Rflx., C-reactive protein  Hypokalemia  Resistant hypertension  Asymptomatic postmenopausal estrogen deficiency - Plan: DG Bone Density  If you need refills please call your pharmacy. Do not use My Chart to request refills or for acute issues that need immediate attention.   Gabapentin may help with sleep and leg pain. Depending of lab results we will  decide  next step for wt loss.  Please be sure medication list is accurate. If a new problem present, please set up appointment sooner than planned today.

## 2020-01-01 ENCOUNTER — Other Ambulatory Visit: Payer: Medicare Other

## 2020-01-01 ENCOUNTER — Other Ambulatory Visit: Payer: Self-pay

## 2020-01-20 DIAGNOSIS — M205X1 Other deformities of toe(s) (acquired), right foot: Secondary | ICD-10-CM | POA: Diagnosis not present

## 2020-01-20 DIAGNOSIS — M2041 Other hammer toe(s) (acquired), right foot: Secondary | ICD-10-CM | POA: Diagnosis not present

## 2020-01-20 DIAGNOSIS — M79671 Pain in right foot: Secondary | ICD-10-CM | POA: Diagnosis not present

## 2020-01-20 DIAGNOSIS — L84 Corns and callosities: Secondary | ICD-10-CM | POA: Diagnosis not present

## 2020-01-31 ENCOUNTER — Ambulatory Visit: Payer: Medicare Other | Admitting: Family Medicine

## 2020-02-05 ENCOUNTER — Other Ambulatory Visit: Payer: Self-pay | Admitting: Family Medicine

## 2020-02-05 ENCOUNTER — Ambulatory Visit: Payer: Medicare Other | Admitting: Vascular Surgery

## 2020-02-05 DIAGNOSIS — Z780 Asymptomatic menopausal state: Secondary | ICD-10-CM

## 2020-02-06 ENCOUNTER — Ambulatory Visit
Admission: RE | Admit: 2020-02-06 | Discharge: 2020-02-06 | Disposition: A | Discharge status: Home / Self Care | Payer: Medicare Other | Source: Ambulatory Visit | Attending: Cardiology | Admitting: Cardiology

## 2020-02-06 ENCOUNTER — Other Ambulatory Visit: Payer: Self-pay

## 2020-02-06 DIAGNOSIS — Z1231 Encounter for screening mammogram for malignant neoplasm of breast: Secondary | ICD-10-CM | POA: Diagnosis not present

## 2020-02-12 ENCOUNTER — Ambulatory Visit (INDEPENDENT_AMBULATORY_CARE_PROVIDER_SITE_OTHER)
Admission: RE | Admit: 2020-02-12 | Discharge: 2020-02-12 | Disposition: A | Discharge status: Home / Self Care | Payer: Medicare Other | Source: Ambulatory Visit | Attending: Family Medicine | Admitting: Family Medicine

## 2020-02-12 ENCOUNTER — Other Ambulatory Visit: Payer: Self-pay

## 2020-02-12 ENCOUNTER — Other Ambulatory Visit (INDEPENDENT_AMBULATORY_CARE_PROVIDER_SITE_OTHER): Payer: Medicare Other

## 2020-02-12 DIAGNOSIS — R634 Abnormal weight loss: Secondary | ICD-10-CM

## 2020-02-12 DIAGNOSIS — E876 Hypokalemia: Secondary | ICD-10-CM

## 2020-02-12 DIAGNOSIS — I10 Essential (primary) hypertension: Secondary | ICD-10-CM

## 2020-02-12 DIAGNOSIS — I517 Cardiomegaly: Secondary | ICD-10-CM | POA: Diagnosis not present

## 2020-02-12 LAB — CBC WITH DIFFERENTIAL/PLATELET
Basophils Absolute: 0 10*3/uL (ref 0.0–0.1)
Basophils Relative: 0.8 % (ref 0.0–3.0)
Eosinophils Absolute: 0.1 10*3/uL (ref 0.0–0.7)
Eosinophils Relative: 3.8 % (ref 0.0–5.0)
HCT: 40.6 % (ref 36.0–46.0)
Hemoglobin: 13.7 g/dL (ref 12.0–15.0)
Lymphocytes Relative: 41.4 % (ref 12.0–46.0)
Lymphs Abs: 1.2 10*3/uL (ref 0.7–4.0)
MCHC: 33.7 g/dL (ref 30.0–36.0)
MCV: 95 fl (ref 78.0–100.0)
Monocytes Absolute: 0.2 10*3/uL (ref 0.1–1.0)
Monocytes Relative: 6.7 % (ref 3.0–12.0)
Neutro Abs: 1.4 10*3/uL (ref 1.4–7.7)
Neutrophils Relative %: 47.3 % (ref 43.0–77.0)
Platelets: 269 10*3/uL (ref 150.0–400.0)
RBC: 4.28 Mil/uL (ref 3.87–5.11)
RDW: 13.3 % (ref 11.5–15.5)
WBC: 3 10*3/uL — ABNORMAL LOW (ref 4.0–10.5)

## 2020-02-12 LAB — COMPREHENSIVE METABOLIC PANEL WITH GFR
ALT: 22 U/L (ref 0–35)
AST: 26 U/L (ref 0–37)
Albumin: 4.6 g/dL (ref 3.5–5.2)
Alkaline Phosphatase: 99 U/L (ref 39–117)
BUN: 12 mg/dL (ref 6–23)
CO2: 31 meq/L (ref 19–32)
Calcium: 11.4 mg/dL — ABNORMAL HIGH (ref 8.4–10.5)
Chloride: 98 meq/L (ref 96–112)
Creatinine, Ser: 0.76 mg/dL (ref 0.40–1.20)
GFR: 74.85 mL/min
Glucose, Bld: 78 mg/dL (ref 70–99)
Potassium: 3.1 meq/L — ABNORMAL LOW (ref 3.5–5.1)
Sodium: 137 meq/L (ref 135–145)
Total Bilirubin: 1.5 mg/dL — ABNORMAL HIGH (ref 0.2–1.2)
Total Protein: 8.1 g/dL (ref 6.0–8.3)

## 2020-02-12 LAB — VITAMIN D 25 HYDROXY (VIT D DEFICIENCY, FRACTURES): VITD: 66.29 ng/mL (ref 30.00–100.00)

## 2020-02-12 LAB — C-REACTIVE PROTEIN: CRP: <1 mg/dL (ref 0.5–20.0)

## 2020-02-12 NOTE — Addendum Note (Signed)
Addended by: Yamel Bale S on: 02/12/2020 10:39 AM   Modules accepted: Orders  

## 2020-02-12 NOTE — Addendum Note (Signed)
Addended by: Trenda Moots on: 08/10/173 10:39 AM   Modules accepted: Orders

## 2020-02-13 LAB — CALCIUM, IONIZED: Calcium, Ion: 5.81 mg/dL — ABNORMAL HIGH (ref 4.8–5.6)

## 2020-02-14 LAB — PROTEIN ELECTROPHORESIS, URINE REFLEX
Albumin ELP, Urine: 31.3 %
Alpha-1-Globulin, U: 7.4 %
Alpha-2-Globulin, U: 10.4 %
Beta Globulin, U: 33.9 %
Gamma Globulin, U: 17 %
Protein, Ur: 4.2 mg/dL

## 2020-02-14 LAB — SPECIMEN STATUS REPORT

## 2020-02-20 ENCOUNTER — Other Ambulatory Visit: Payer: Self-pay

## 2020-02-21 ENCOUNTER — Ambulatory Visit (INDEPENDENT_AMBULATORY_CARE_PROVIDER_SITE_OTHER): Payer: Medicare Other | Admitting: Family Medicine

## 2020-02-21 ENCOUNTER — Encounter: Payer: Self-pay | Admitting: Family Medicine

## 2020-02-21 VITALS — BP 140/80 | HR 82 | Resp 16 | Ht 62.5 in | Wt 196.5 lb

## 2020-02-21 DIAGNOSIS — Z6835 Body mass index (BMI) 35.0-35.9, adult: Secondary | ICD-10-CM

## 2020-02-21 DIAGNOSIS — I700 Atherosclerosis of aorta: Secondary | ICD-10-CM

## 2020-02-21 DIAGNOSIS — R600 Localized edema: Secondary | ICD-10-CM

## 2020-02-21 DIAGNOSIS — E876 Hypokalemia: Secondary | ICD-10-CM

## 2020-02-21 DIAGNOSIS — G629 Polyneuropathy, unspecified: Secondary | ICD-10-CM | POA: Diagnosis not present

## 2020-02-21 DIAGNOSIS — R17 Unspecified jaundice: Secondary | ICD-10-CM

## 2020-02-21 DIAGNOSIS — I10 Essential (primary) hypertension: Secondary | ICD-10-CM | POA: Diagnosis not present

## 2020-02-21 DIAGNOSIS — L304 Erythema intertrigo: Secondary | ICD-10-CM | POA: Diagnosis not present

## 2020-02-21 MED ORDER — NYSTATIN-TRIAMCINOLONE 100000-0.1 UNIT/GM-% EX CREA: 1.0000 "application " | TOPICAL_CREAM | Freq: Two times a day (BID) | CUTANEOUS | 1 refills | Status: AC | PRN

## 2020-02-21 MED ORDER — NYSTATIN 100000 UNIT/GM EX POWD: 1.0000 "application " | Freq: Three times a day (TID) | CUTANEOUS | 2 refills | Status: AC

## 2020-02-21 NOTE — Patient Instructions (Addendum)
A few things to remember from today's visit:   Intertrigo - Plan: nystatin-triamcinolone (MYCOLOG II) cream, nystatin (MYCOSTATIN/NYSTOP) powder  Hypokalemia  Hypercalcemia - Plan: Phosphorus, Vitamin D, 1,25-dihydroxy, PTH, intact and calcium  Serum total bilirubin elevated - Plan: US Abdomen Limited RUQ (LIVER/GB)  If you need refills please call your pharmacy. Do not use My Chart to request refills or for acute issues that need immediate attention.   Stop Furosemide. Lab in 2 weeks. Topical meds added today.  Please be sure medication list is accurate. If a new problem present, please set up appointment sooner than planned today.

## 2020-02-21 NOTE — Progress Notes (Signed)
HPI: Kara Diaz is a 80 y.o. female, who is here today for follow up.   She was last seen on 12/27/19, when she was concerned about abnormal wt loss. She is trying to do better and trying to cut down "junk" food. No new symptoms since her last visit.  LNL:GXQJJHERD negative except for aortic atherosclerosis. Follows with cardiologist q 3 months. She is on Aspirin 81 mg daily and Atorvastatin 40 mg daily.  HypoK+: She is on KLOR 20 meq daily.  Takes Furosemide 40 mg, 1/2 tab daily, to treat LE edema. She decided to discontinue, last dose was yesterday. She takes Triamterene-HCTZ 37.5-25 mg daily for HTN. She is also on Amlodipine-Benazepril 10-40 mg daily. Negative for headache,CP,palpitations,orthopnea,PND,or focal neurologic deficit.  Lab Results  Component Value Date   CREATININE 0.76 02/12/2020   BUN 12 02/12/2020   NA 137 02/12/2020   K 3.1 (L) 02/12/2020   CL 98 02/12/2020   CO2 31 02/12/2020   Reports hx of low WBC's.  Lab Results  Component Value Date   WBC 3.0 (L) 02/12/2020   HGB 13.7 02/12/2020   HCT 40.6 02/12/2020   MCV 95.0 02/12/2020   PLT 269.0 02/12/2020   Hypercalcemia: Problem seems to be going on since 2014. Negative for abdominal pain,N/V,or changes in bowel habits.  Lab Results  Component Value Date   PTH 49 06/26/2019   CALCIUM 11.4 (H) 02/12/2020   CAION 5.81 (H) 02/12/2020   She is a Futures trader, works with fabrics. No hx of chemical exposure or metals.  Night sweats for 1-2 years, causing skin irritation, lower abdomen and under breast.  Pruritic. Keeping areas dry helps. No associated fever or chills.  Peripheral neuropathy: She is on vit supplement prescribed by podiatrist a few years ago, Metanx. She wonders it this is contributing to hyperCa++. Since she has been on Mitanx symptoms (Calves/feet burning and throbbing) have greatly improved and not longer affecting sleep.  Review of Systems  Constitutional:  Negative for activity change, appetite change, fatigue and fever.  HENT: Negative for mouth sores, nosebleeds and sore throat.   Eyes: Negative for redness and visual disturbance.  Respiratory: Negative for cough, shortness of breath and wheezing.   Genitourinary: Negative for decreased urine volume, dysuria and hematuria.  Neurological: Negative for syncope, facial asymmetry and weakness.  Rest of ROS, see pertinent positives sand negatives in HPI  Current Outpatient Medications on File Prior to Visit  Medication Sig Dispense Refill  . amLODipine-benazepril (LOTREL) 10-40 MG capsule Take 1 capsule by mouth daily. 90 capsule 3  . aspirin EC 81 MG tablet Take 81 mg by mouth daily.    Marland Kitchen atorvastatin (LIPITOR) 40 MG tablet Take 1 tablet (40 mg total) by mouth daily. 90 tablet 3  . CALCIUM PO Take by mouth. Take 1 tablet by mouth.    . Cholecalciferol (VITAMIN D3) 2000 UNITS TABS Take 2,000 Units by mouth daily.     . cycloSPORINE (RESTASIS) 0.05 % ophthalmic emulsion Place 1 drop into both eyes 2 (two) times daily.    Marland Kitchen KLOR-CON M20 20 MEQ tablet TAKE 1 TABLET DAILY 90 tablet 3  . L-Methylfolate-Algae-B12-B6 (METANX) 3-90.314-2-35 MG CAPS Take 1 capsule by mouth 2 (two) times daily.    Marland Kitchen MAGNESIUM PO Take by mouth. Take 1 tablet by mouth daily.    . Multiple Vitamin (MULTIVITAMIN) tablet Take 1 tablet by mouth daily.    Marland Kitchen triamterene-hydrochlorothiazide (DYAZIDE) 37.5-25 MG capsule Take 1 each (1 capsule  total) by mouth every morning. 90 capsule 3   No current facility-administered medications on file prior to visit.     Past Medical History:  Diagnosis Date  . Angina   . Arthritis    all over  . 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 Early cataracts, bilateral   . Hemorrhoids   . History of radiation therapy 06/07/11-07/11/11   left breast 50Gy/22fx  . Hyperlipidemia    doesn't take any meds for this  . Hypertension    takes Dyazide and  Lotrel daily  . Insomnia    but doesn't take anything for this  . Joint pain   . Joint swelling   . Personal history of radiation therapy 2013  . Pneumonia    hx of in 1992  . PONV (postoperative nausea and vomiting)   . Urinary frequency    No Known Allergies  Social History   Socioeconomic History  . Marital status: Single    Spouse name: Not on file  . Number of children: Not on file  . Years of education: Not on file  . Highest education level: Not on file  Occupational History  . Not on file  Tobacco Use  . Smoking status: Former Smoker    Types: Cigarettes    Quit date: 01/10/1990    Years since quitting: 30.1  . Smokeless tobacco: Never Used  . Tobacco comment: quit in 1992  Vaping Use  . Vaping Use: Never used  Substance and Sexual Activity  . Alcohol use: Yes    Comment: wine few times a week  . Drug use: No  . Sexual activity: Not Currently    Birth control/protection: Surgical  Other Topics Concern  . Not on file  Social History Narrative  . Not on file   Social Determinants of Health   Financial Resource Strain: Not on file  Food Insecurity: Not on file  Transportation Needs: Not on file  Physical Activity: Not on file  Stress: Not on file  Social Connections: Not on file    Vitals:   02/21/20 1046  BP: 140/80  Pulse: 82  Resp: 16  SpO2: 96%   Wt Readings from Last 3 Encounters:  02/21/20 196 lb 8 oz (89.1 kg)  12/27/19 197 lb 3.2 oz (89.4 kg)  10/30/19 198 lb 4.8 oz (89.9 kg)   Body mass index is 35.37 kg/m.  Physical Exam Vitals and nursing note reviewed.  Constitutional:      General: She is not in acute distress.    Appearance: She is well-developed.  HENT:     Head: Normocephalic and atraumatic.     Mouth/Throat:     Mouth: Oropharynx is clear and moist and mucous membranes are normal. Mucous membranes are moist.     Pharynx: Oropharynx is clear.  Eyes:     Conjunctiva/sclera: Conjunctivae normal.  Cardiovascular:      Rate and Rhythm: Normal rate. Rhythm irregular. Occasional extrasystoles are present.    Pulses:          Dorsalis pedis pulses are 2+ on the right side and 2+ on the left side.     Heart sounds: No murmur heard.     Comments: Trace pitting LE edema, bilateral. Pulmonary:     Effort: Pulmonary effort is normal. No respiratory distress.     Breath sounds: Normal breath sounds.  Abdominal:     Palpations: Abdomen is soft. There is no hepatomegaly  or mass.     Tenderness: There is no abdominal tenderness.  Musculoskeletal:        General: No edema.  Lymphadenopathy:     Cervical: No cervical adenopathy.  Skin:    General: Skin is warm.     Comments: No erythematous rash appreciated, mild post inflammatory pigmentation changes under breast.  Neurological:     Mental Status: She is alert and oriented to person, place, and time.     Cranial Nerves: No cranial nerve deficit.     Deep Tendon Reflexes: Strength normal.     Comments: Antalgic gait, assisted with a cane.  Psychiatric:        Mood and Affect: Mood and affect normal.     Comments: Well groomed, good eye contact.     ASSESSMENT AND PLAN:  Kara Diaz was seen today for follow-up.  Orders Placed This Encounter  Procedures  . US Abdomen Limited RUQ (LIVER/GB)  . Phosphorus  . Vitamin D, 1,25-dihydroxy  . PTH, intact and calcium  . Potassium  . Magnesium  . Calcium / creatinine ratio, urine   Intertrigo We discussed Dx,prognosis,and treatment options. Continue keeping area dry. Nystatin powder may help with decreasing frequency.  -     nystatin-triamcinolone (MYCOLOG II) cream; Apply 1 application topically 2 (two) times daily as needed. -     nystatin (MYCOSTATIN/NYSTOP) powder; Apply 1 application topically 3 (three) times daily.  Hypokalemia Problem is not well controlled. Furosemide discontinued. Continue KLOR 20 meq daily.  Hypercalcemia PHT in normal range,Ca++ has been stable for years. ?  Primary hyperparathyroidism. OTC supplements and HCTZ could be contributing factors.  DEXA order is active, she will try to have it done at [facility] in 2 weeks. She is having a few more labs in 2 weeks, if stable I do not think further work up will be necessary, if still concerned we can arrange appt with endocrinologist.  Serum total bilirubin elevated Mild. Because it is new, liver US was ordered today.  Lower extremity edema Mild. Furosemide side effects, discontinued. LE elevation and compression stockings will help.  Resistant hypertension BP otherwise adequate. Continue Maxzide and Lotrel same dose. Low salt diet.  Peripheral polyneuropathy Problem has been well controlled with Metanx. She doe snot want to stop supplement because it has helped. Recommend bringing med with her next time. Continue appropriate foot care.  Class 2 severe obesity due to excess calories with serious comorbidity and body mass index (BMI) of 35.0 to 35.9 in adult [facility]) Wt has been stable sine her last visit, she was concerned about unexplained wt loss. We discussed benefi of wt loss through a healthier diet and increasing physical activity.  Aortic atherosclerosis (HCC) Seen on imaging. She is on Atorvastatin and aspirin 81 mg daily.  Spent 41 minutes with pt.  During this time history was obtained and documented, examination was performed, prior labs reviewed, and assessment/plan discussed.  Return in about 4 months (around 06/20/2020).  Sherine Cortese G. Martinique, MD  [facility]. [place] office.   A few things to remember from today's visit:   Intertrigo - Plan: nystatin-triamcinolone (MYCOLOG II) cream, nystatin (MYCOSTATIN/NYSTOP) powder  Hypokalemia  Hypercalcemia - Plan: Phosphorus, Vitamin D, 1,25-dihydroxy, PTH, intact and calcium  Serum total bilirubin elevated - Plan: US Abdomen Limited RUQ (LIVER/GB)  If you need refills please call your pharmacy. Do not use My Chart to  request refills or for acute issues that need immediate attention.   Stop Furosemide. Lab  in 2 weeks. Topical meds added today.  Please be sure medication list is accurate. If a new problem present, please set up appointment sooner than planned today.

## 2020-02-27 ENCOUNTER — Encounter: Payer: Self-pay | Admitting: Vascular Surgery

## 2020-02-27 ENCOUNTER — Other Ambulatory Visit: Payer: Self-pay

## 2020-02-27 ENCOUNTER — Ambulatory Visit (INDEPENDENT_AMBULATORY_CARE_PROVIDER_SITE_OTHER): Payer: Medicare Other | Admitting: Vascular Surgery

## 2020-02-27 VITALS — BP 131/79 | HR 67 | Temp 97.7°F | Resp 20 | Ht 62.5 in | Wt 202.0 lb

## 2020-02-27 DIAGNOSIS — I8393 Asymptomatic varicose veins of bilateral lower extremities: Secondary | ICD-10-CM

## 2020-02-27 DIAGNOSIS — I83812 Varicose veins of left lower extremities with pain: Secondary | ICD-10-CM

## 2020-02-27 NOTE — Progress Notes (Signed)
Patient is a 80 year old female who returns for follow-up today.  She has had multiple bouts of cellulitis in her left lower extremity in the past year.  She currently has some intermittent pain in her left leg but overall is improved.  She has been intermittently wearing a left leg compression stocking.  I discussed with her the possibility of laser ablation of her left lesser saphenous vein which has diffuse reflux and is dilated to reduce risk of recurrent cellulitis episodes.  However, she is not interested in any surgical intervention due to her age.  Physical exam:  Vitals:   02/27/20 1059  BP: 131/79  Pulse: 67  Resp: 20  Temp: 97.7 F (36.5 C)  SpO2: 99%  Weight: 202 lb (91.6 kg)  Height: 5' 2.5" (1.588 m)    Extremities: Thickened skin slight hemosiderin deposition left calf area no ulceration chronic appearing edema no warmth or erythema  Data: I reviewed her duplex scan from October 2021 which showed diffuse reflux in the lesser saphenous vein no significant reflux in the greater saphenous vein lesser saphenous vein diameter was 5 to 7 mm.  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  Plan: Patient is opted for compression therapy alone at this point.  She could be considered for laser ablation in the future if she wishes to proceed with that.  We refitted her today for 20 to 30 mm knee-high compression stockings.  Ruta Hinds, MD [facility] Office: [phone]

## 2020-03-11 ENCOUNTER — Ambulatory Visit
Admission: RE | Admit: 2020-03-11 | Discharge: 2020-03-11 | Disposition: A | Discharge status: Home / Self Care | Payer: Medicare Other | Source: Ambulatory Visit | Attending: Family Medicine | Admitting: Family Medicine

## 2020-03-11 DIAGNOSIS — K7689 Other specified diseases of liver: Secondary | ICD-10-CM | POA: Diagnosis not present

## 2020-03-11 DIAGNOSIS — R17 Unspecified jaundice: Secondary | ICD-10-CM

## 2020-03-13 DIAGNOSIS — I2510 Atherosclerotic heart disease of native coronary artery without angina pectoris: Secondary | ICD-10-CM | POA: Diagnosis not present

## 2020-03-13 DIAGNOSIS — M109 Gout, unspecified: Secondary | ICD-10-CM | POA: Diagnosis not present

## 2020-03-13 DIAGNOSIS — E785 Hyperlipidemia, unspecified: Secondary | ICD-10-CM | POA: Diagnosis not present

## 2020-03-13 DIAGNOSIS — I10 Essential (primary) hypertension: Secondary | ICD-10-CM | POA: Diagnosis not present

## 2020-03-27 ENCOUNTER — Other Ambulatory Visit (INDEPENDENT_AMBULATORY_CARE_PROVIDER_SITE_OTHER): Payer: Medicare Other

## 2020-03-27 DIAGNOSIS — E876 Hypokalemia: Secondary | ICD-10-CM | POA: Diagnosis not present

## 2020-03-27 LAB — MAGNESIUM: Magnesium: 1.9 mg/dL (ref 1.5–2.5)

## 2020-03-27 LAB — PHOSPHORUS: Phosphorus: 2.8 mg/dL (ref 2.3–4.6)

## 2020-03-27 LAB — POTASSIUM: Potassium: 3.4 mEq/L — ABNORMAL LOW (ref 3.5–5.1)

## 2020-04-01 LAB — VITAMIN D 1,25 DIHYDROXY
Vitamin D 1, 25 (OH)2 Total: 50 pg/mL (ref 18–72)
Vitamin D2 1, 25 (OH)2: <8 pg/mL
Vitamin D3 1, 25 (OH)2: 50 pg/mL

## 2020-04-01 LAB — CALCIUM / CREATININE RATIO, URINE
CALCIUM, RANDOM URINE: 2 mg/dL
CALCIUM/CREATININE RATIO: 200 mg/g creat (ref 10–320)
Creatinine, Urine: 10 mg/dL — ABNORMAL LOW (ref 20–275)

## 2020-04-01 LAB — PTH, INTACT AND CALCIUM
Calcium: 11.3 mg/dL — ABNORMAL HIGH (ref 8.6–10.4)
PTH: 66 pg/mL (ref 16–77)

## 2020-04-08 IMAGING — CR DG TIBIA/FIBULA 2V*R*
4 series · 4 of 4 positions shown · non-contrast
Comparison: None.

CLINICAL DATA: Swelling and pain in the right lower leg for 1
month.

EXAM:
RIGHT TIBIA AND FIBULA - 2 VIEW

[t tib-fib ap right (1 of 2)]
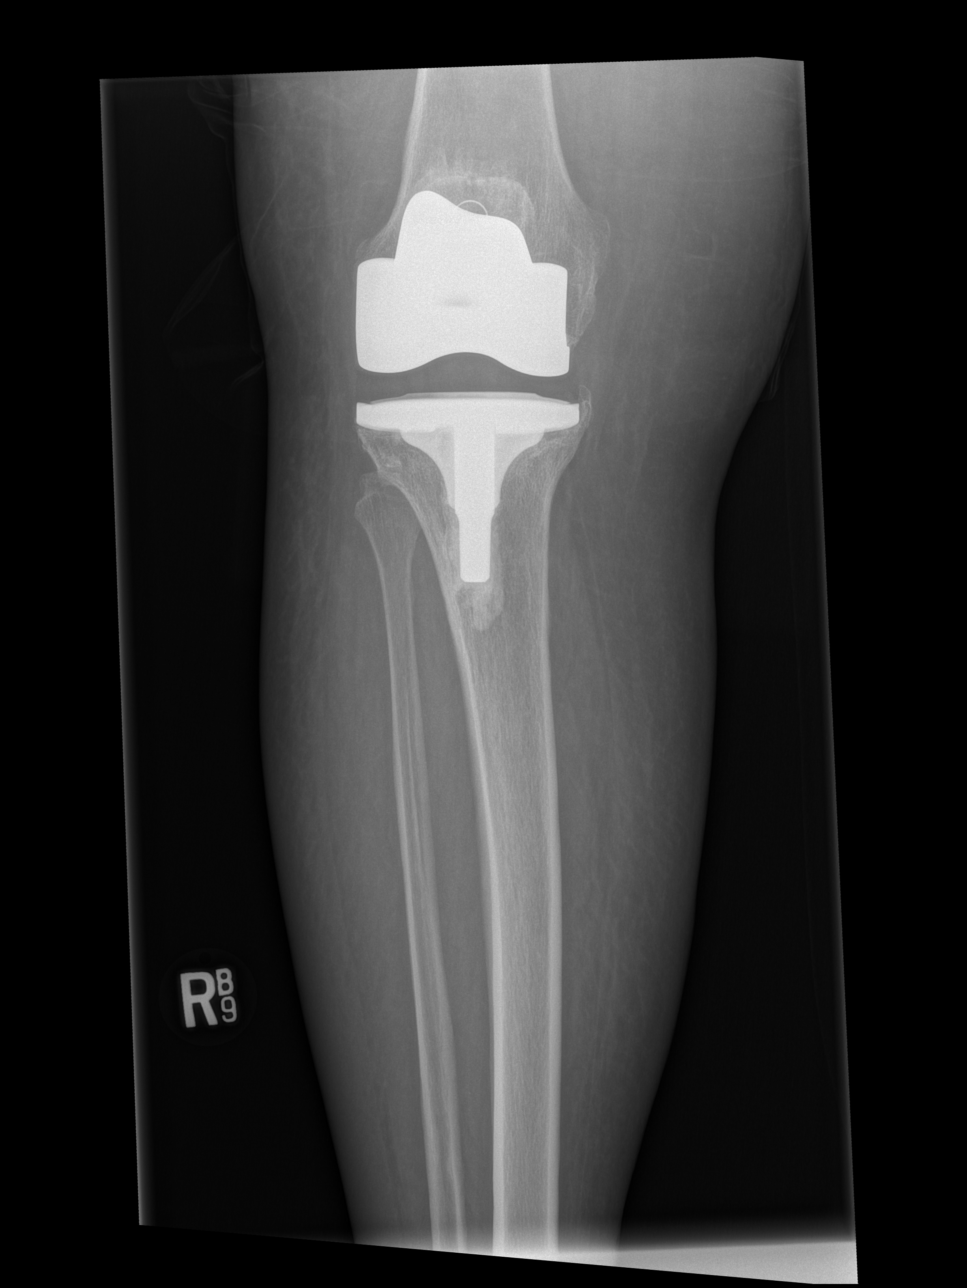

[t tib-fib ap right (2 of 2)]
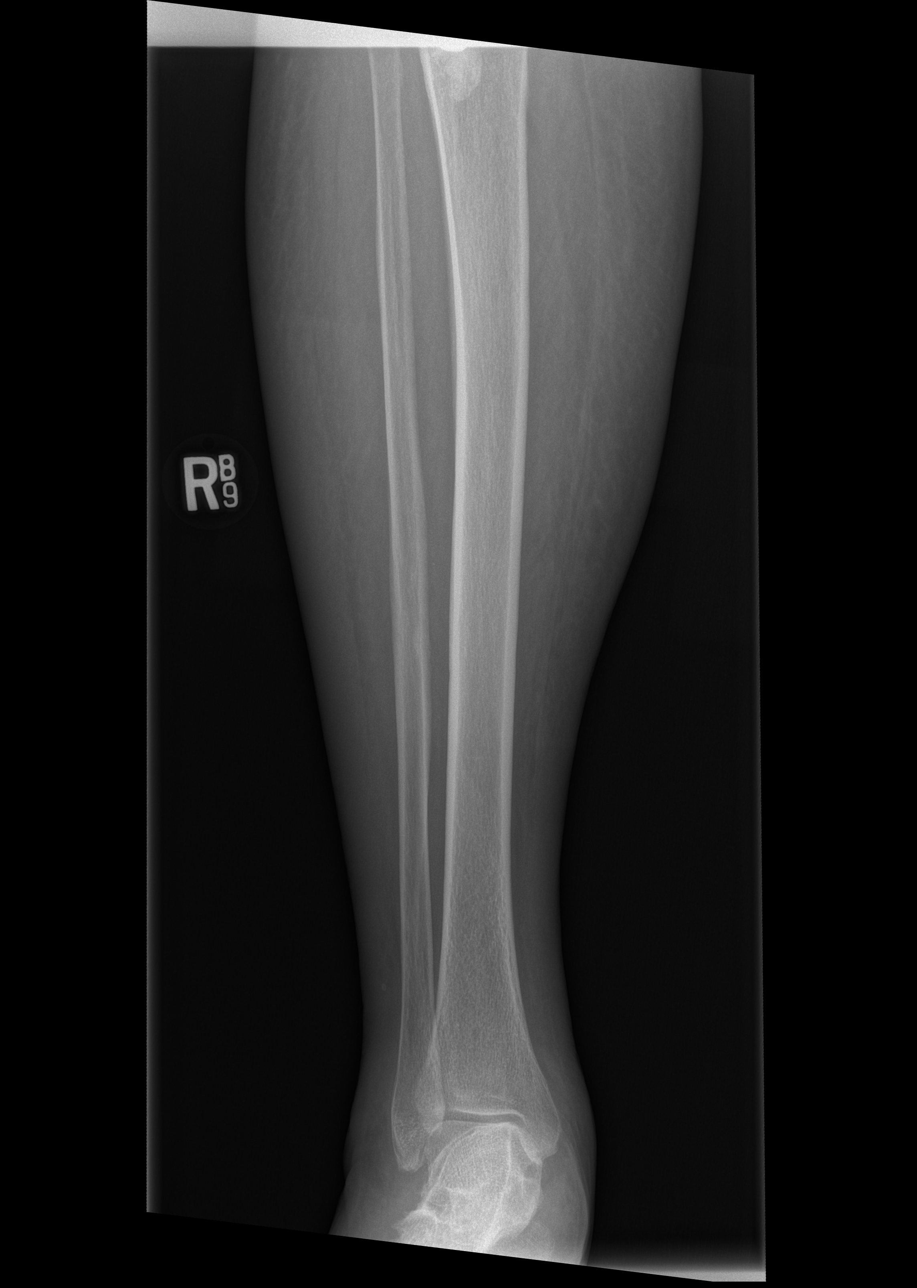

[t tib-fib lat right (1 of 2)]
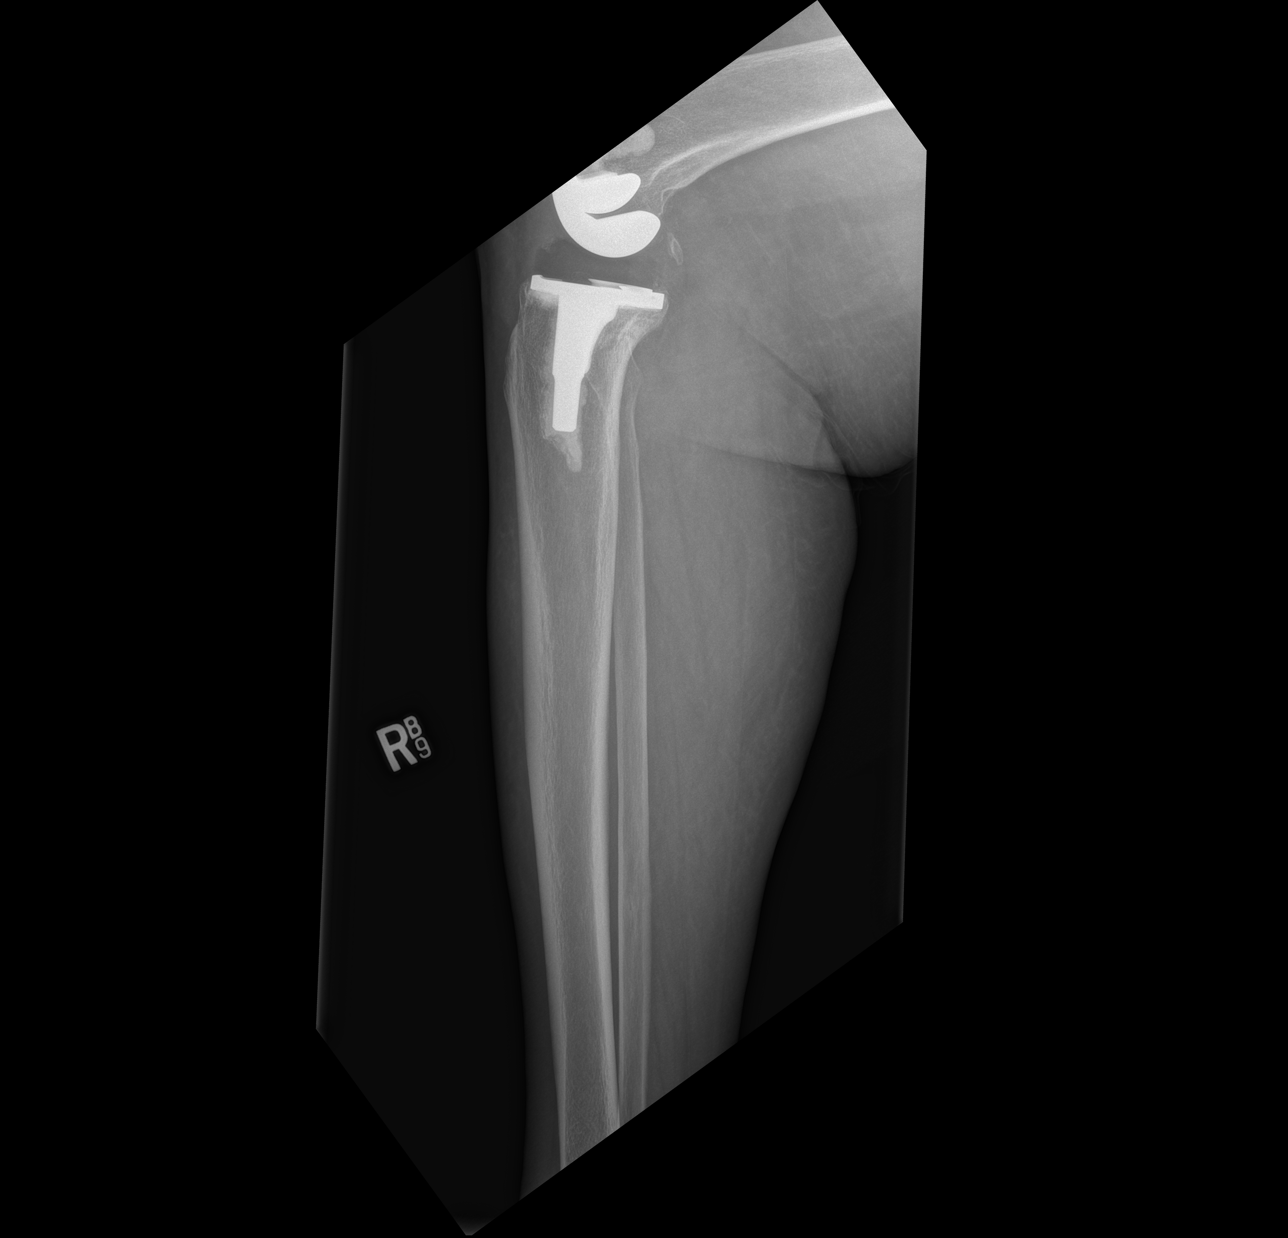

[t tib-fib lat right (2 of 2)]
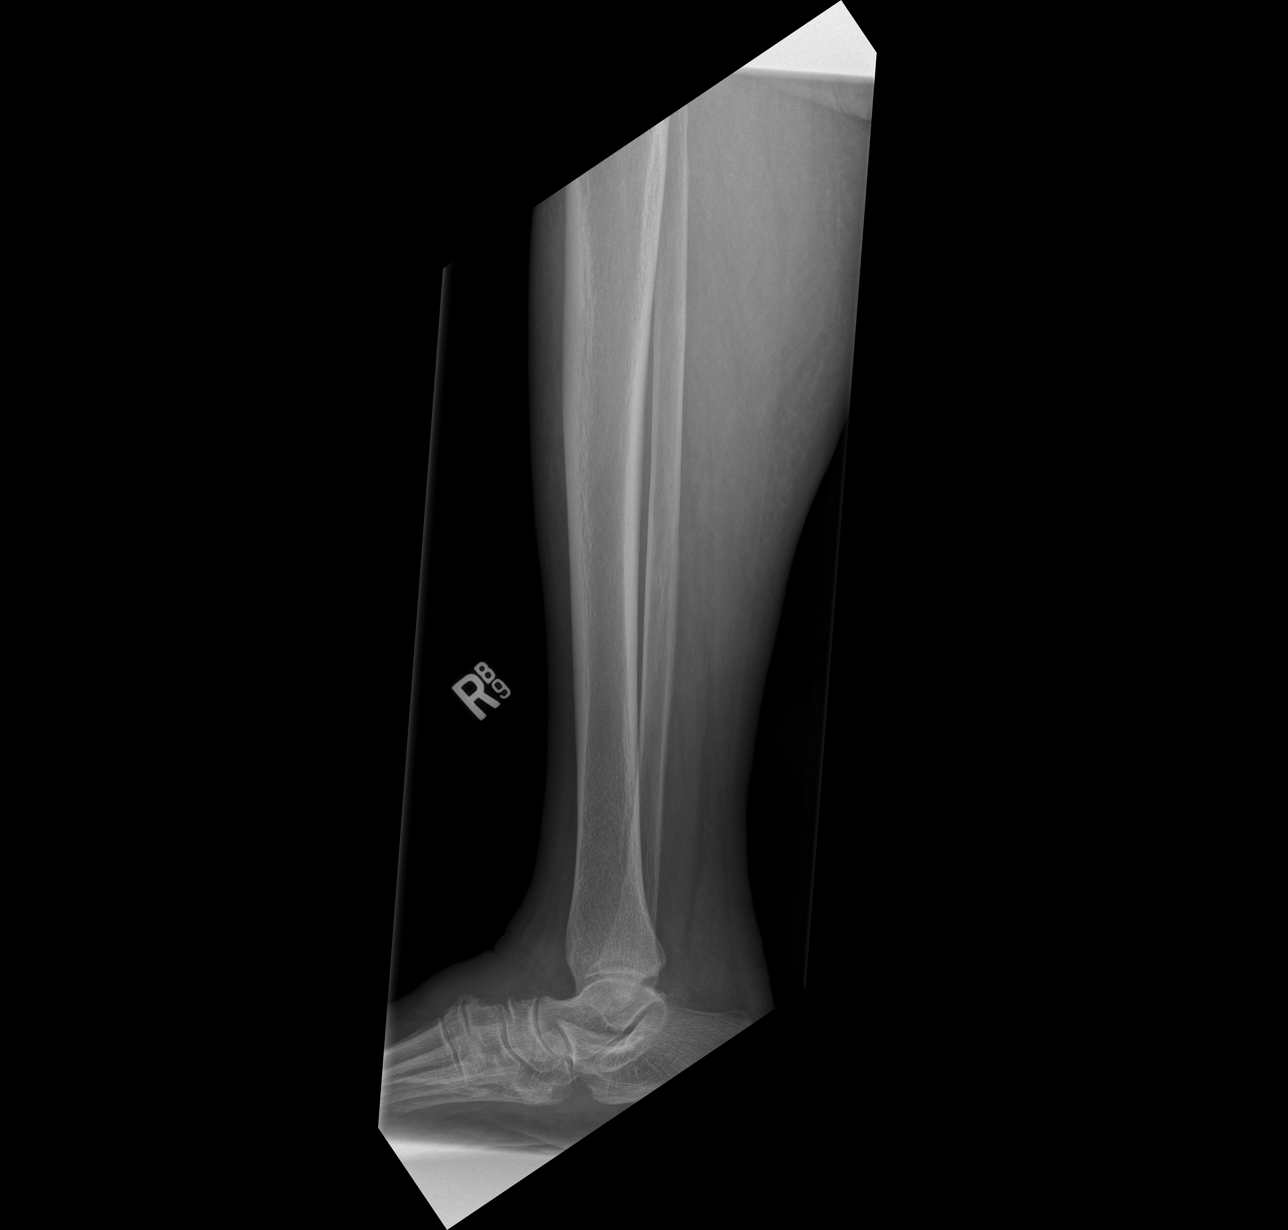

[4 of 4 positions shown; findings below may reference images not displayed]

FINDINGS: There is no evidence of fracture or other focal bone lesions. Right
total knee prosthesis in place with no evidence of loosening. Soft
tissues appear normal.
IMPRESSION: No significant abnormality.

## 2020-04-15 ENCOUNTER — Telehealth: Payer: Self-pay | Admitting: Family Medicine

## 2020-04-15 NOTE — Telephone Encounter (Signed)
Left message for patient to call back and schedule Medicare Annual Wellness Visit (AWV) either virtually or in office. No detailed message left   AWV-I per [place] 01/10/09  please schedule at anytime with [facility] Nurse Health Advisor 1 or 2   This should be a 45 minute visit. 

## 2020-04-17 ENCOUNTER — Telehealth: Payer: Self-pay | Admitting: Family Medicine

## 2020-04-17 DIAGNOSIS — M79671 Pain in right foot: Secondary | ICD-10-CM | POA: Diagnosis not present

## 2020-04-17 DIAGNOSIS — M2041 Other hammer toe(s) (acquired), right foot: Secondary | ICD-10-CM | POA: Diagnosis not present

## 2020-04-17 DIAGNOSIS — M205X1 Other deformities of toe(s) (acquired), right foot: Secondary | ICD-10-CM | POA: Diagnosis not present

## 2020-04-17 DIAGNOSIS — L84 Corns and callosities: Secondary | ICD-10-CM | POA: Diagnosis not present

## 2020-04-17 NOTE — Telephone Encounter (Signed)
Patient is calling and is requesting a call back regarding lab results and medications, please advise. CB is [phone]

## 2020-04-17 NOTE — Telephone Encounter (Signed)
See result note.  

## 2020-05-14 DIAGNOSIS — Z23 Encounter for immunization: Secondary | ICD-10-CM | POA: Diagnosis not present

## 2020-06-11 ENCOUNTER — Telehealth: Payer: Self-pay | Admitting: Family Medicine

## 2020-06-11 NOTE — Telephone Encounter (Signed)
Pt is scheduled to have a bone density on 06/12/2020 and [facility] is needing to have a signed order sent back to them today or early 06/12/2020.

## 2020-06-12 ENCOUNTER — Other Ambulatory Visit: Payer: Self-pay | Admitting: Internal Medicine

## 2020-06-12 ENCOUNTER — Other Ambulatory Visit: Payer: Self-pay | Admitting: Family Medicine

## 2020-06-12 ENCOUNTER — Ambulatory Visit
Admission: RE | Admit: 2020-06-12 | Discharge: 2020-06-12 | Disposition: A | Discharge status: Home / Self Care | Payer: Medicare Other | Source: Ambulatory Visit | Attending: Family Medicine | Admitting: Family Medicine

## 2020-06-12 ENCOUNTER — Other Ambulatory Visit: Payer: Self-pay

## 2020-06-12 DIAGNOSIS — E2839 Other primary ovarian failure: Secondary | ICD-10-CM

## 2020-06-12 DIAGNOSIS — M85851 Other specified disorders of bone density and structure, right thigh: Secondary | ICD-10-CM | POA: Diagnosis not present

## 2020-06-12 DIAGNOSIS — I2510 Atherosclerotic heart disease of native coronary artery without angina pectoris: Secondary | ICD-10-CM | POA: Diagnosis not present

## 2020-06-12 DIAGNOSIS — I10 Essential (primary) hypertension: Secondary | ICD-10-CM | POA: Diagnosis not present

## 2020-06-12 DIAGNOSIS — E785 Hyperlipidemia, unspecified: Secondary | ICD-10-CM | POA: Diagnosis not present

## 2020-06-12 DIAGNOSIS — M810 Age-related osteoporosis without current pathological fracture: Secondary | ICD-10-CM | POA: Diagnosis not present

## 2020-06-12 DIAGNOSIS — Z780 Asymptomatic menopausal state: Secondary | ICD-10-CM | POA: Diagnosis not present

## 2020-06-12 NOTE — Telephone Encounter (Signed)
Order faxed.

## 2020-06-26 ENCOUNTER — Other Ambulatory Visit: Payer: Self-pay

## 2020-06-26 ENCOUNTER — Encounter: Payer: Self-pay | Admitting: Family Medicine

## 2020-06-26 ENCOUNTER — Ambulatory Visit (INDEPENDENT_AMBULATORY_CARE_PROVIDER_SITE_OTHER): Payer: Medicare Other | Admitting: Family Medicine

## 2020-06-26 VITALS — BP 184/76 | HR 75 | Temp 97.8°F | Ht 62.5 in | Wt 202.2 lb

## 2020-06-26 DIAGNOSIS — I10 Essential (primary) hypertension: Secondary | ICD-10-CM | POA: Diagnosis not present

## 2020-06-26 DIAGNOSIS — R600 Localized edema: Secondary | ICD-10-CM | POA: Diagnosis not present

## 2020-06-26 DIAGNOSIS — R296 Repeated falls: Secondary | ICD-10-CM

## 2020-06-26 DIAGNOSIS — E876 Hypokalemia: Secondary | ICD-10-CM

## 2020-06-26 DIAGNOSIS — M816 Localized osteoporosis [Lequesne]: Secondary | ICD-10-CM | POA: Diagnosis not present

## 2020-06-26 NOTE — Assessment & Plan Note (Signed)
BP elevated today. She does not want to add a new agent at this time. We discussed possible complications of elevated BP. Continue triamterene-HCTZ 37.5-25 mg daily and amlodipine-benazepril 10-40 mg daily. Continue monitoring BP regularly, instructed to bring BP monitor next visit. Continue low-salt diet.

## 2020-06-26 NOTE — Progress Notes (Signed)
Chief Complaint  Patient presents with   Hypertension   HPI: [name] is a 80 y.o. female with hx of aortic atherosclerosis,HTN,left in situ ductal carcinoma, and generalized OA who is here today concerned about elevated BP. She was last seen on 02/21/2020. Since her last visit she has seen her cardiologist, 06/12/2020. According to the patient, her BP was 140s/?  At her cardiologist office. Currently she is on triamterene-HCTZ 37.5-25 mg daily and amlodipine-benazepril 10-40 mg daily.  Home BP's 150-160/70's, she states that it has ran this high before.  Negative for headache, visual changes, chest pain, dyspnea, palpitation, focal weakness.  HypoK+: She is on K-Lor 20 mEq daily.  Lower extremity edema: Vascular evaluation on 02/27/2020, Dr. Oneida Alar.  Knee-high custom compression stockings, 20 to 30 mmHg recommended.  Lab Results  Component Value Date   CREATININE 0.76 02/12/2020   BUN 12 02/12/2020   NA 137 02/12/2020   K 3.4 (L) 03/27/2020   CL 98 02/12/2020   CO2 31 02/12/2020   DEXA 06/12/2020: The BMD measured at Forearm Radius 33% is 0.655 g/cm2 with a T-score of -2.6. This patient is considered osteoporotic according to [facility]) criteria.  The quality of the exam is good. The lumbar spine was excluded due to degenerative changes.  Site Region Measured Date Measured Age YA BMD Significant CHANGE T-score Left Forearm Radius 33% 06/12/2020 [age] -2.7 0.655 g/cm2 Left Forearm Radius 33% 07/27/2017 77.2 -2[age]4 0.675 g/cm2  Reporting frequent falls. She has had 2 falls this year, usually carrying things in both hands and not able to to hold on something.  HyperCa++: This is a chronic problem, calcium has been elevated intermittently since 07/2012, 11.3 in 05/2017. Negative for abdominal pain, nausea, vomiting, changes in bowel habits, or MS changes.  Lab Results  Component Value Date   VD25OH 66.29 02/12/2020   Lab Results  Component  Value Date   PTH 66 03/27/2020   CALCIUM 11.3 (H) 03/27/2020   CAION 5.81 (H) 02/12/2020   PHOS 2.8 03/27/2020   Review of Systems  Constitutional:  Negative for activity change, appetite change, chills and fever.  HENT:  Negative for mouth sores, nosebleeds and sore throat.   Respiratory:  Negative for cough and wheezing.   Endocrine: Negative for cold intolerance and heat intolerance.  Genitourinary:  Negative for decreased urine volume and hematuria.  Musculoskeletal:  Positive for arthralgias and gait problem.  Skin:  Negative for pallor and rash.  Neurological:  Negative for syncope, facial asymmetry and weakness.  Rest see pertinent positives and negatives per HPI.  Current Outpatient Medications on File Prior to Visit  Medication Sig Dispense Refill   amLODipine-benazepril (LOTREL) 10-40 MG capsule Take 1 capsule by mouth daily. 90 capsule 3   aspirin EC 81 MG tablet Take 81 mg by mouth daily.     atorvastatin (LIPITOR) 40 MG tablet Take 1 tablet (40 mg total) by mouth daily. 90 tablet 3   CALCIUM PO Take by mouth. Take 1 tablet by mouth.     Cholecalciferol (VITAMIN D3) 2000 UNITS TABS Take 2,000 Units by mouth daily.      cycloSPORINE (RESTASIS) 0.05 % ophthalmic emulsion Place 1 drop into both eyes 2 (two) times daily.     KLOR-CON M20 20 MEQ tablet TAKE 1 TABLET DAILY 90 tablet 3   L-Methylfolate-Algae-B12-B6 (METANX) 3-90.314-2-35 MG CAPS Take 1 capsule by mouth 2 (two) times daily.     MAGNESIUM PO Take by mouth. Take  1 tablet by mouth daily.     Multiple Vitamin (MULTIVITAMIN) tablet Take 1 tablet by mouth daily.     nystatin (MYCOSTATIN/NYSTOP) powder Apply 1 application topically 3 (three) times daily. 15 g 2   nystatin-triamcinolone (MYCOLOG II) cream Apply 1 application topically 2 (two) times daily as needed. 45 g 1   triamterene-hydrochlorothiazide (DYAZIDE) 37.5-25 MG capsule Take 1 each (1 capsule total) by mouth every morning. 90 capsule 3   No current  facility-administered medications on file prior to visit.   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   Early cataracts, bilateral    Hemorrhoids    History of radiation therapy 06/07/11-07/11/11   left breast 50Gy/61f   Hyperlipidemia    doesn't take any meds for this   Hypertension    takes Dyazide and Lotrel daily   Insomnia    but doesn't take anything for this   Joint pain    Joint swelling    Personal history of radiation therapy 2013   Pneumonia    hx of in 1992   PONV (postoperative nausea and vomiting)    Urinary frequency    No Known Allergies  Social History   Socioeconomic History   Marital status: Single    Spouse name: Not on file   Number of children: Not on file   Years of education: Not on file   Highest education level: Not on file  Occupational History   Not on file  Tobacco Use   Smoking status: Former    Pack years: 0.00    Types: Cigarettes    Quit date: 01/10/1990    Years since quitting: 30.4   Smokeless tobacco: Never   Tobacco comments:    quit in 1992  Vaping Use   Vaping Use: Never used  Substance and Sexual Activity   Alcohol use: Yes    Comment: wine few times a week   Drug use: No   Sexual activity: Not Currently    Birth control/protection: Surgical  Other Topics Concern   Not on file  Social History Narrative   Not on file   Social Determinants of Health   Financial Resource Strain: Not on file  Food Insecurity: Not on file  Transportation Needs: Not on file  Physical Activity: Not on file  Stress: Not on file  Social Connections: Not on file    Vitals:   06/26/20 0943  BP: (!) 184/76  Pulse: 75  Temp: 97.8 F (36.6 C)  SpO2: 97%   Body mass index is 36.39 kg/m.  Physical Exam Vitals and nursing note reviewed.  Constitutional:      General: She is not in acute distress.    Appearance: She is  well-developed.  HENT:     Head: Normocephalic and atraumatic.     Mouth/Throat:     Mouth: Mucous membranes are moist.     Pharynx: Oropharynx is clear.  Eyes:     Conjunctiva/sclera: Conjunctivae normal.  Cardiovascular:     Rate and Rhythm: Normal rate and regular rhythm.     Pulses:          Posterior tibial pulses are 2+ on the right side and 2+ on the left side.     Heart sounds: Murmur heard.  Pulmonary:     Effort: Pulmonary effort is normal. No respiratory distress.  Breath sounds: Normal breath sounds.  Abdominal:     Palpations: Abdomen is soft. There is no hepatomegaly or mass.     Tenderness: There is no abdominal tenderness.  Musculoskeletal:     Right lower leg: 1+ Pitting Edema present.     Left lower leg: 1+ Pitting Edema present.  Lymphadenopathy:     Cervical: No cervical adenopathy.  Skin:    General: Skin is warm.     Findings: No erythema or rash.  Neurological:     General: No focal deficit present.     Mental Status: She is alert and oriented to person, place, and time.     Cranial Nerves: No cranial nerve deficit.     Gait: Gait normal.  Psychiatric:     Comments: Well groomed, good eye contact.   ASSESSMENT AND PLAN:  Ms.Lashon was seen today for hypertension.  Diagnoses and all orders for this visit: Orders Placed This Encounter  Procedures   CMP with eGFR([facility])   Basic Metabolic Panel   Lab Results  Component Value Date   CREATININE 0.69 06/26/2020   BUN 13 06/26/2020   NA 143 06/26/2020   K 3.6 06/26/2020   CL 104 06/26/2020   CO2 28 06/26/2020   Lab Results  Component Value Date   ALT 14 06/26/2020   AST 23 06/26/2020   ALKPHOS 99 02/12/2020   BILITOT 1.1 06/26/2020   Falls frequently Fall precautions discussed. Risk factors: Some of chronic medical conditions and medications.  Hypercalcemia Primary hyperparathyroidism. Reviewed Dx,prognosis ,and treatment options. Endocrinologist evaluation can be considered,  she prefers to hold on this. Continue vit D supplementation and normal ca++ intake, through her diet ideally (1000 mg daily). Problem has been stable, we will continue monitoring labs q 6 months.  Resistant hypertension BP elevated today. She does not want to add a new agent at this time. We discussed possible complications of elevated BP. Continue triamterene-HCTZ 37.5-25 mg daily and amlodipine-benazepril 10-40 mg daily. Continue monitoring BP regularly, instructed to bring BP monitor next visit. Continue low-salt diet.  Localized osteoporosis without current pathological fracture We discussed results of recent DEXA. Educated about diagnosis, prognosis, and treatment options. She would like to hold on pharmacologic treatment for now, she will consider Fosamax, information given. Fall prevention discussed. Weightbearing exercises twice per week and regular physical activity. Continue adequate calcium and vitamin D supplementation. DEXA to be repeated in 2 to 3 years.  Lower extremity edema Problem seems to be stable. Compression stocking and LE elevation recommended.  Hypokalemia Last K+ in 03/2020 was mildly low at 3.4. We discussed some side effects of diuretics. continue K-Lor 20 mEq daily. Further recommendation will be given according to BMP result.  Spent 40 minutes.  During this time history was obtained and documented, examination was performed, prior labs reviewed, and assessment/plan discussed.  Return in about 2 months (around 08/26/2020) for HTN.   Athanasius Kesling G. Martin[place], MD  [facility]. [place] office.  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 Do not use My Chart to request refills or for acute issues that need immediate attention.   Continue monitoring blood pressure, if elevated we will  need to consider adding a new medication. Gareth Eagle seems to be stable, will continue monitoring. Continue calcium intake at 1000 mg daily and vitamin D supplementation.  Please be sure medication list is accurate. If a new problem present, please set up appointment sooner than planned today.   Alendronate Tablets What is this medication? ALENDRONATE (a LEN droe nate) prevents and treats osteoporosis. It may also be used to treat Paget disease of the bone. It works by Paramedic stronger and less likely to break (fracture). It belongs to a group of medicationscalled bisphosphonates. This medicine may be used for other purposes; ask your health care provider orpharmacist if you have questions. COMMON BRAND NAME(S): Fosamax What should I tell my care team before I take this medication? They need to know if you have any of these conditions: Bleeding disorder Cancer Dental disease Difficulty swallowing Infection (fever, chills, cough, sore throat, pain or trouble passing urine) Kidney disease Low levels of calcium or other minerals in the blood Low red blood cell counts Receiving steroids like dexamethasone or prednisone Stomach or intestine problems Trouble sitting or standing for 30 minutes An unusual or allergic reaction to alendronate, other medications, foods, dyes or preservatives Pregnant or trying to get pregnant Breast-feeding How should I use this medication? Take this medication by mouth with a full glass of water. Take it as directed on the prescription label at the same time every day. Take the dose right after waking up. Do not eat or drink anything before taking it. Do not take it with any other drink except water. Do not chew or crush the tablet. After taking  it, do not eat breakfast, drink, or take any other medications or vitamins for at least 30 minutes. Sit or stand up for at least 30 minutes after you take it. Donot lie down. Keep taking it unless your care team tells you to stop. A special MedGuide will be given to you by the pharmacist with eachprescription and refill. Be sure to read this information carefully each time. Talk to your care team about the use of this medication in children. Specialcare may be needed. Overdosage: If you think you have taken too much of this medicine contact apoison control center or emergency room at once. NOTE: This medicine is only for you. Do not share this medicine with others. What if I miss a dose? If you take your medication once a day, skip it. Take your next dose at thescheduled time the next morning. Do not take two doses on the same day. If you take your medication once a week, take the missed dose on the morningafter you remember. Do not take two doses on the same day. What may interact with this medication? Aluminum hydroxide Antacids Aspirin Calcium supplements Medications for inflammation like ibuprofen, naproxen, and others Iron supplements Magnesium supplements Vitamins with minerals This list may not describe all possible interactions. Give your health care provider a list of all the medicines, herbs, non-prescription drugs, or dietary supplements you use. Also tell them if you smoke, drink alcohol, or use illegaldrugs. Some items may interact with your medicine. What should I watch for while using this medication? Visit your care team for regular checks on your progress. It may be some timebefore you see the benefit from this medication. Some people who take this medication have severe bone, joint, or muscle pain. This medication may also increase your risk for jaw problems or a broken thigh bone. Tell your care team right away  if you have severe pain in your jaw, bones, joints, or muscles.  Tell you care team if you have any pain that doesnot go away or that gets worse. Tell your dentist and dental surgeon that you are taking this medication. You should not have major dental surgery while on this medication. See your dentist to have a dental exam and fix any dental problems before starting this medication. Take good care of your teeth while on this medication. Make sureyou see your dentist for regular follow-up appointments. You should make sure you get enough calcium and vitamin D while you are taking this medication. Discuss the foods you eat and the vitamins you take with yourcare team. You may need blood work done while you are taking this medication. What side effects may I notice from receiving this medication? Side effects that you should report to your care team as soon as possible: Allergic reactions-skin rash, itching, hives, swelling of the face, lips, tongue, or throat Low calcium level-muscle pain or cramps, confusion, tingling, or numbness in the hands or feet Osteonecrosis of the jaw-pain, swelling, or redness in the mouth, numbness of the jaw, poor healing after dental work, unusual discharge from the mouth, visible bones in the mouth Pain or trouble swallowing Severe bone, joint, or muscle pain Stomach bleeding-bloody or black, tar-like stools, vomiting blood or brown material that looks like coffee grounds Side effects that usually do not require medical attention (report to your careteam if they continue or are bothersome): Constipation Diarrhea Nausea Stomach pain This list may not describe all possible side effects. Call your doctor for medical advice about side effects. You may report side effects to FDA at1-800-FDA-1088. Where should I keep my medication? Keep out of the reach of children and pets. Store at room temperature between 15 and 30 degrees C (59 and 86 degrees F).Throw away any unused medication after the expiration date. NOTE: This sheet is a  summary. It may not cover all possible information. If you have questions about this medicine, talk to your doctor, pharmacist, orhealth care provider.  2022 Elsevier/Gold Standard (2020-01-09 10:35:10)

## 2020-06-26 NOTE — Assessment & Plan Note (Signed)
Last K+ in 03/2020 was mildly low at 3.4. We discussed some side effects of diuretics. continue K-Lor 20 mEq daily. Further recommendation will be given according to BMP result.

## 2020-06-26 NOTE — Patient Instructions (Addendum)
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 Do not use My Chart to request refills or for acute issues that need immediate attention.   Continue monitoring blood pressure, if elevated we will need to consider adding a new medication. Kara Diaz seems to be stable, will continue monitoring. Continue calcium intake at 1000 mg daily and vitamin D supplementation.  Please be sure medication list is accurate. If a new problem present, please set up appointment sooner than planned today.   Alendronate Tablets What is this medication? ALENDRONATE (a LEN droe nate) prevents and treats osteoporosis. It may also be used to treat Paget disease of the bone. It works by Paramedic stronger and less likely to break (fracture). It belongs to a group of medicationscalled bisphosphonates. This medicine may be used for other purposes; ask your health care provider orpharmacist if you have questions. COMMON BRAND NAME(S): Fosamax What should I tell my care team before I take this medication? They need to know if you have any of these conditions: Bleeding disorder Cancer Dental disease Difficulty swallowing Infection (fever, chills, cough, sore throat, pain or trouble passing urine) Kidney disease Low levels of calcium or other minerals in the blood Low red blood cell counts Receiving steroids like dexamethasone or prednisone Stomach or intestine problems Trouble sitting or standing for 30 minutes An unusual or allergic reaction to alendronate, other medications, foods, dyes or preservatives Pregnant or trying to get  pregnant Breast-feeding How should I use this medication? Take this medication by mouth with a full glass of water. Take it as directed on the prescription label at the same time every day. Take the dose right after waking up. Do not eat or drink anything before taking it. Do not take it with any other drink except water. Do not chew or crush the tablet. After taking it, do not eat breakfast, drink, or take any other medications or vitamins for at least 30 minutes. Sit or stand up for at least 30 minutes after you take it. Donot lie down. Keep taking it unless your care team tells you to stop. A special MedGuide will be given to you by the pharmacist with eachprescription and refill. Be sure to read this information carefully each time. Talk to your care team about the use of this medication in children. Specialcare may be needed. Overdosage: If you think you have taken too much of this medicine contact apoison control center or emergency room at once. NOTE: This medicine is only for you. Do not share this medicine with others. What if I miss a dose? If you take your medication once a day, skip it. Take your next dose at thescheduled time the next morning. Do not take two doses on the same day. If you take your medication once a week, take the missed dose on the morningafter you remember. Do not take two doses on the same day. What may interact with this medication? Aluminum hydroxide Antacids Aspirin Calcium supplements Medications for inflammation like ibuprofen, naproxen, and others Iron supplements Magnesium supplements Vitamins with minerals This list may not describe all possible interactions. Give your health care provider a list of all the medicines, herbs, non-prescription  drugs, or dietary supplements you use. Also tell them if you smoke, drink alcohol, or use illegaldrugs. Some items may interact with your medicine. What should I watch for while using this medication? Visit your care  team for regular checks on your progress. It may be some timebefore you see the benefit from this medication. Some people who take this medication have severe bone, joint, or muscle pain. This medication may also increase your risk for jaw problems or a broken thigh bone. Tell your care team right away if you have severe pain in your jaw, bones, joints, or muscles. Tell you care team if you have any pain that doesnot go away or that gets worse. Tell your dentist and dental surgeon that you are taking this medication. You should not have major dental surgery while on this medication. See your dentist to have a dental exam and fix any dental problems before starting this medication. Take good care of your teeth while on this medication. Make sureyou see your dentist for regular follow-up appointments. You should make sure you get enough calcium and vitamin D while you are taking this medication. Discuss the foods you eat and the vitamins you take with yourcare team. You may need blood work done while you are taking this medication. What side effects may I notice from receiving this medication? Side effects that you should report to your care team as soon as possible: Allergic reactions-skin rash, itching, hives, swelling of the face, lips, tongue, or throat Low calcium level-muscle pain or cramps, confusion, tingling, or numbness in the hands or feet Osteonecrosis of the jaw-pain, swelling, or redness in the mouth, numbness of the jaw, poor healing after dental work, unusual discharge from the mouth, visible bones in the mouth Pain or trouble swallowing Severe bone, joint, or muscle pain Stomach bleeding-bloody or black, tar-like stools, vomiting blood or brown material that looks like coffee grounds Side effects that usually do not require medical attention (report to your careteam if they continue or are bothersome): Constipation Diarrhea Nausea Stomach pain This list may not describe all possible  side effects. Call your doctor for medical advice about side effects. You may report side effects to FDA at1-800-FDA-1088. Where should I keep my medication? Keep out of the reach of children and pets. Store at room temperature between 15 and 30 degrees C (59 and 86 degrees F).Throw away any unused medication after the expiration date. NOTE: This sheet is a summary. It may not cover all possible information. If you have questions about this medicine, talk to your doctor, pharmacist, orhealth care provider.  2022 Elsevier/Gold Standard (2020-01-09 10:35:10)

## 2020-06-26 NOTE — Assessment & Plan Note (Signed)
We discussed results of recent DEXA. Educated about diagnosis, prognosis, and treatment options. She would like to hold on pharmacologic treatment for now, she will consider Fosamax, information given. Fall prevention discussed. Weightbearing exercises twice per week and regular physical activity. Continue adequate calcium and vitamin D supplementation. DEXA to be repeated in 2 to 3 years.

## 2020-06-26 NOTE — Assessment & Plan Note (Signed)
Problem seems to be stable. Compression stocking and LE elevation recommended.

## 2020-06-27 LAB — COMPLETE METABOLIC PANEL WITH GFR
AG Ratio: 1.5 (calc) (ref 1.0–2.5)
ALT: 14 U/L (ref 6–29)
AST: 23 U/L (ref 10–35)
Albumin: 4.4 g/dL (ref 3.6–5.1)
Alkaline phosphatase (APISO): 97 U/L (ref 37–153)
BUN: 13 mg/dL (ref 7–25)
CO2: 28 mmol/L (ref 20–32)
Calcium: 11.6 mg/dL — ABNORMAL HIGH (ref 8.6–10.4)
Chloride: 104 mmol/L (ref 98–110)
Creat: 0.69 mg/dL (ref 0.60–0.93)
GFR, Est African American: 96 mL/min/{1.73_m2} (ref >=60)
GFR, Est Non African American: 83 mL/min/{1.73_m2} (ref >=60)
Globulin: 3 g/dL (calc) (ref 1.9–3.7)
Glucose, Bld: 85 mg/dL (ref 65–99)
Potassium: 3.6 mmol/L (ref 3.5–5.3)
Sodium: 143 mmol/L (ref 135–146)
Total Bilirubin: 1.1 mg/dL (ref 0.2–1.2)
Total Protein: 7.4 g/dL (ref 6.1–8.1)

## 2020-07-06 ENCOUNTER — Telehealth: Payer: Self-pay | Admitting: Family Medicine

## 2020-07-06 NOTE — Telephone Encounter (Signed)
Left message for patient to call back and schedule Medicare Annual Wellness Visit (AWV) either virtually or in office.   AWV-I per PALMETTO 01/10/09 please schedule at anytime with [facility] Nurse Health Advisor 1 or 2   This should be a 45 minute visit. 

## 2020-07-08 NOTE — Telephone Encounter (Signed)
Pt called in stating the she will call back to schedule and to see if the appointment is still available on the same day as when she will be in the office.

## 2020-07-08 NOTE — Telephone Encounter (Signed)
Noted  

## 2020-07-22 DIAGNOSIS — Z20822 Contact with and (suspected) exposure to covid-19: Secondary | ICD-10-CM | POA: Diagnosis not present

## 2020-07-24 DIAGNOSIS — Z20822 Contact with and (suspected) exposure to covid-19: Secondary | ICD-10-CM | POA: Diagnosis not present

## 2020-07-28 ENCOUNTER — Encounter: Payer: Self-pay | Admitting: Family Medicine

## 2020-07-29 DIAGNOSIS — M205X1 Other deformities of toe(s) (acquired), right foot: Secondary | ICD-10-CM | POA: Diagnosis not present

## 2020-07-29 DIAGNOSIS — L84 Corns and callosities: Secondary | ICD-10-CM | POA: Diagnosis not present

## 2020-08-10 ENCOUNTER — Telehealth: Payer: Self-pay | Admitting: Family Medicine

## 2020-08-10 NOTE — Telephone Encounter (Signed)
Pt call and stated she is returning your call and want you to call her back at her office at [phone].

## 2020-08-10 NOTE — Telephone Encounter (Signed)
Left message for patient to call back and schedule Medicare Annual Wellness Visit (AWV) either virtually or in office.   AWV-I per PALMETTO 01/10/09 please schedule at anytime with [facility] Nurse Health Advisor 1 or 2   This should be a 45 minute visit. 

## 2020-08-10 NOTE — Telephone Encounter (Signed)
Tried returned patients call   No answer  If patient calls back please schedule AWVI 01/10/09 per palmetto

## 2020-08-18 NOTE — Telephone Encounter (Signed)
Documented on spreadsheet 

## 2020-08-19 ENCOUNTER — Telehealth: Payer: Self-pay | Admitting: Family Medicine

## 2020-08-19 DIAGNOSIS — H52203 Unspecified astigmatism, bilateral: Secondary | ICD-10-CM | POA: Diagnosis not present

## 2020-08-19 DIAGNOSIS — H04123 Dry eye syndrome of bilateral lacrimal glands: Secondary | ICD-10-CM | POA: Diagnosis not present

## 2020-08-19 DIAGNOSIS — H2513 Age-related nuclear cataract, bilateral: Secondary | ICD-10-CM | POA: Diagnosis not present

## 2020-08-19 DIAGNOSIS — H5213 Myopia, bilateral: Secondary | ICD-10-CM | POA: Diagnosis not present

## 2020-08-19 DIAGNOSIS — H524 Presbyopia: Secondary | ICD-10-CM | POA: Diagnosis not present

## 2020-08-19 NOTE — Telephone Encounter (Signed)
Left message asking patient to call  I gave both office number and my jabber number [phone]  Please r/s 08/21/20 AWV appointment Mickel Baas will be out of office

## 2020-08-21 ENCOUNTER — Ambulatory Visit: Payer: Medicare Other

## 2020-09-02 ENCOUNTER — Ambulatory Visit: Payer: Medicare Other | Admitting: Family Medicine

## 2020-09-16 ENCOUNTER — Ambulatory Visit: Payer: Medicare Other | Admitting: Family Medicine

## 2020-09-18 DIAGNOSIS — I10 Essential (primary) hypertension: Secondary | ICD-10-CM | POA: Diagnosis not present

## 2020-09-18 DIAGNOSIS — I2510 Atherosclerotic heart disease of native coronary artery without angina pectoris: Secondary | ICD-10-CM | POA: Diagnosis not present

## 2020-09-18 DIAGNOSIS — E785 Hyperlipidemia, unspecified: Secondary | ICD-10-CM | POA: Diagnosis not present

## 2020-09-25 DIAGNOSIS — Z23 Encounter for immunization: Secondary | ICD-10-CM | POA: Diagnosis not present

## 2020-10-14 ENCOUNTER — Ambulatory Visit (INDEPENDENT_AMBULATORY_CARE_PROVIDER_SITE_OTHER): Payer: Medicare Other | Admitting: Family Medicine

## 2020-10-14 ENCOUNTER — Encounter: Payer: Self-pay | Admitting: Family Medicine

## 2020-10-14 ENCOUNTER — Other Ambulatory Visit: Payer: Self-pay

## 2020-10-14 VITALS — BP 140/70 | HR 88 | Temp 97.9°F | Resp 16 | Ht 62.5 in | Wt 190.4 lb

## 2020-10-14 DIAGNOSIS — Z6835 Body mass index (BMI) 35.0-35.9, adult: Secondary | ICD-10-CM

## 2020-10-14 DIAGNOSIS — I10 Essential (primary) hypertension: Secondary | ICD-10-CM

## 2020-10-14 DIAGNOSIS — M6530 Trigger finger, unspecified finger: Secondary | ICD-10-CM | POA: Diagnosis not present

## 2020-10-14 DIAGNOSIS — E66812 Obesity, class 2: Secondary | ICD-10-CM

## 2020-10-14 DIAGNOSIS — I1A0 Resistant hypertension: Secondary | ICD-10-CM

## 2020-10-14 DIAGNOSIS — E876 Hypokalemia: Secondary | ICD-10-CM

## 2020-10-14 NOTE — Progress Notes (Signed)
HPI:  [name] is a 80 y.o. female, who is here today for follow up.   She was last seen on 06/26/20. Since her last visit she has seen her eye care provider. Last visit she was concerned about elevated BP's readings, she was not interested in adjusting her medications.  Hypertension:  Medications:She is on triamterene-HCTZ 37.5-25 mg daily and amlodipine-benazepril 10-40 mg daily. BP readings at home:Her BP monitor was not reading her BP, she went to her pharmacy and BP in the 180's/80's but at her cardiologist's office 140's/80's x 3. Side effects:None She saw her cardiologist last month,seen him q 3-4 months.  Negative for unusual or severe headache, visual changes, exertional chest pain, dyspnea,  focal weakness, or edema.  HypoK+: She is on KLOR 20 meq daily.  Lab Results  Component Value Date   CREATININE 0.69 06/26/2020   BUN 13 06/26/2020   NA 143 06/26/2020   K 3.6 06/26/2020   CL 104 06/26/2020   CO2 28 06/26/2020   She is trying to eat healthier, she is eating vegetable soup home made. She decreased snacking. She has noted wt loss.  Flu vaccine 2-3 weeks ago a few days latter 5th right finger snapping and with limitation of active extension.Mild pain with movement. She noted it first when she woke up in the morning with hand closed. No erythema. No hx of trauma.  Review of Systems  Constitutional:  Negative for activity change, appetite change, chills and fever.  Respiratory:  Negative for cough and wheezing.   Gastrointestinal:  Negative for abdominal pain, nausea and vomiting.  Genitourinary:  Negative for decreased urine volume and hematuria.  Musculoskeletal:  Positive for arthralgias and gait problem.  Skin:  Negative for pallor and rash.  Neurological:  Negative for syncope and weakness.  Psychiatric/Behavioral:  Positive for sleep disturbance.   Rest of ROS, see pertinent positives sand negatives in HPI  Current Outpatient  Medications on File Prior to Visit  Medication Sig Dispense Refill   amLODipine-benazepril (LOTREL) 10-40 MG capsule Take 1 capsule by mouth daily. 90 capsule 3   aspirin EC 81 MG tablet Take 81 mg by mouth daily.     atorvastatin (LIPITOR) 40 MG tablet Take 1 tablet (40 mg total) by mouth daily. 90 tablet 3   CALCIUM PO Take by mouth. Take 1 tablet by mouth.     Cholecalciferol (VITAMIN D3) 2000 UNITS TABS Take 2,000 Units by mouth daily.      cycloSPORINE (RESTASIS) 0.05 % ophthalmic emulsion Place 1 drop into both eyes 2 (two) times daily.     KLOR-CON M20 20 MEQ tablet TAKE 1 TABLET DAILY 90 tablet 3   L-Methylfolate-Algae-B12-B6 (METANX) 3-90.314-2-35 MG CAPS Take 1 capsule by mouth 2 (two) times daily.     MAGNESIUM PO Take by mouth. Take 1 tablet by mouth daily.     Multiple Vitamin (MULTIVITAMIN) tablet Take 1 tablet by mouth daily.     nystatin (MYCOSTATIN/NYSTOP) powder Apply 1 application topically 3 (three) times daily. 15 g 2   nystatin-triamcinolone (MYCOLOG II) cream Apply 1 application topically 2 (two) times daily as needed. 45 g 1   triamterene-hydrochlorothiazide (DYAZIDE) 37.5-25 MG capsule Take 1 each (1 capsule total) by mouth every morning. 90 capsule 3   No current facility-administered medications on file prior to visit.   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   Early cataracts, bilateral    Hemorrhoids    History of radiation therapy 06/07/11-07/11/11   left breast 50Gy/36fx   Hyperlipidemia    doesn't take any meds for this   Hypertension    takes Dyazide and Lotrel daily   Insomnia    but doesn't take anything for this   Joint pain    Joint swelling    Personal history of radiation therapy 2013   Pneumonia    hx of in 1992   PONV (postoperative nausea and vomiting)    Urinary frequency    No Known Allergies  Social History    Socioeconomic History   Marital status: Single    Spouse name: Not on file   Number of children: Not on file   Years of education: Not on file   Highest education level: Not on file  Occupational History   Not on file  Tobacco Use   Smoking status: Former    Types: Cigarettes    Quit date: 01/10/1990    Years since quitting: 30.7   Smokeless tobacco: Never   Tobacco comments:    quit in 1992  Vaping Use   Vaping Use: Never used  Substance and Sexual Activity   Alcohol use: Yes    Comment: wine few times a week   Drug use: No   Sexual activity: Not Currently    Birth control/protection: Surgical  Other Topics Concern   Not on file  Social History Narrative   Not on file   Social Determinants of Health   Financial Resource Strain: Not on file  Food Insecurity: Not on file  Transportation Needs: Not on file  Physical Activity: Not on file  Stress: Not on file  Social Connections: Not on file   Vitals:   10/14/20 0957  BP: 140/70  Pulse: 88  Resp: 16  Temp: 97.9 F (36.6 C)  SpO2: 98%   Body mass index is 34.27 kg/m.  Physical Exam Vitals and nursing note reviewed.  Constitutional:      General: She is not in acute distress.    Appearance: She is well-developed.  HENT:     Head: Normocephalic and atraumatic.  Eyes:     Conjunctiva/sclera: Conjunctivae normal.  Cardiovascular:     Rate and Rhythm: Normal rate and regular rhythm.     Pulses:          Dorsalis pedis pulses are 2+ on the right side and 2+ on the left side.     Heart sounds: Murmur (SEM I/VI LUSB) heard.  Pulmonary:     Effort: Pulmonary effort is normal. No respiratory distress.     Breath sounds: Normal breath sounds.  Abdominal:     Palpations: Abdomen is soft. There is no mass.     Tenderness: There is no abdominal tenderness.  Musculoskeletal:     Comments: Right 5th finger: Upon palmar palpation there is a  nodular like pattern, mildly tender. No fixed contractures  appreciated. Clicking sensation upon active flexion and extension movement.  Lymphadenopathy:     Cervical: No cervical adenopathy.  Skin:    General: Skin is warm.     Findings: No erythema or rash.  Neurological:     General: No focal deficit present.     Mental Status: She is alert and oriented to person, place, and time.     Cranial Nerves: No cranial nerve deficit.  Comments: Untalgic gait, assisted by a cane.  Psychiatric:     Comments: Well groomed, good eye contact.  ASSESSMENT AND PLAN:  Ms. Kara Diaz was seen today for follow-up.  Diagnoses and all orders for this visit:  Trigger finger of right hand, unspecified finger We discussed Dx,prognosis,and treatment options. It has improved. For now she is not interested in invasive treatments, will let me know if problem gets worse.  Resistant hypertension BP adequately controlled. Low salt/DASH diet recommended. Checking BP at home causes anxiety, so do not check it for now. She sees her cardiologist every 3-4 months.  Hypokalemia Problem is stable. Continue KLOR 20 meq daily. We will plan on checking it next visit.  Class 2 severe obesity with serious comorbidity and body mass index (BMI) of 35.0 to 35.9 in adult, unspecified obesity type (HCC) BMI now 34. She has lost about 12 Lb since her last visit. We discussed benefits of wt loss. Consistency with healthy diet and physical activity encouraged.  Return in about 5 months (around 03/23/2021).  Yomayra Tate G. [place], MD  [facility]. [place] office.

## 2020-10-14 NOTE — Patient Instructions (Addendum)
A few things to remember from today's visit:   Resistant hypertension  Hypokalemia  Trigger finger of right hand, unspecified finger  No changes today. Let me know if finger gets worse, you may need to see sport medicine.  If you need refills please call your pharmacy. Do not use My Chart to request refills or for acute issues that need immediate attention.  Trigger Finger Trigger finger, also called stenosing tenosynovitis,  is a condition that causes a finger to get stuck in a bent position. Each finger has a tendon, which is a tough, cord-like tissue that connects muscle to bone, and each tendon passes through a tunnel of tissue called a tendon sheath. To move your finger, your tendon needs to glide freely through the sheath. Trigger finger happens when the tendon or the sheath thickens, making it difficult to move your finger. Trigger finger can affect any finger or a thumb. It may affect more than one finger. Mild cases may clear up with rest and medicine. Severe cases require more treatment. What are the causes? Trigger finger is caused by a thickened finger tendon or tendon sheath. The cause of this thickening is not known. What increases the risk? The following factors may make you more likely to develop this condition: Doing activities that require a strong grip. Having rheumatoid arthritis, gout, or diabetes. Being 24-48 years old. Being female. What are the signs or symptoms? Symptoms of this condition include: Pain when bending or straightening your finger. Tenderness or swelling where your finger attaches to the palm of your hand. A lump in the palm of your hand or on the inside of your finger. Hearing a noise like a pop or a snap when you try to straighten your finger. Feeling a catching or locking sensation when you try to straighten your finger. Being unable to straighten your finger. How is this diagnosed? This condition is diagnosed based on your symptoms and a  physical exam. How is this treated? This condition may be treated by: Resting your finger and avoiding activities that make symptoms worse. Wearing a finger splint to keep your finger extended. Taking NSAIDs, such as ibuprofen, to relieve pain and swelling. Doing gentle exercises to stretch the finger as told by your health care provider. Having medicine that reduces swelling and inflammation (steroids) injected into the tendon sheath. Injections may need to be repeated. Having surgery to open the tendon sheath. This may be done if other treatments do not work and you cannot straighten your finger. You may need physical therapy after surgery. Follow these instructions at home: If you have a splint: Wear the splint as told by your health care provider. Remove it only as told by your health care provider. Loosen it if your fingers tingle, become numb, or turn cold and blue. Keep it clean. If the splint is not waterproof: Do not let it get wet. Cover it with a watertight covering when you take a bath or shower. Managing pain, stiffness, and swelling   If directed, apply heat to the affected area as often as told by your health care provider. Use the heat source that your health care provider recommends, such as a moist heat pack or a heating pad. Place a towel between your skin and the heat source. Leave the heat on for 20-30 minutes. Remove the heat if your skin turns bright red. This is especially important if you are unable to feel pain, heat, or cold. You may have a greater risk of getting  burned. If directed, put ice on the painful area. To do this: If you have a removable splint, remove it as told by your health care provider. Put ice in a plastic bag. Place a towel between your skin and the bag or between your splint and the bag. Leave the ice on for 20 minutes, 2-3 times a day.  Activity Rest your finger as told by your health care provider. Avoid activities that make the pain  worse. Return to your normal activities as told by your health care provider. Ask your health care provider what activities are safe for you. Do exercises as told by your health care provider. Ask your health care provider when it is safe to drive if you have a splint on your hand. General instructions Take over-the-counter and prescription medicines only as told by your health care provider. Keep all follow-up visits as told by your health care provider. This is important. Contact a health care provider if: Your symptoms are not improving with home care. Summary Trigger finger, also called stenosing tenosynovitis, causes your finger to get stuck in a bent position. This can make it difficult and painful to straighten your finger. This condition develops when a finger tendon or tendon sheath thickens. Treatment may include resting your finger, wearing a splint, and taking medicines. In severe cases, surgery to open the tendon sheath may be needed. This information is not intended to replace advice given to you by your health care provider. Make sure you discuss any questions you have with your health care provider. Document Revised: 05/14/2018 Document Reviewed: 05/14/2018 Elsevier Patient Education  [employer].   Please be sure medication list is accurate. If a new problem present, please set up appointment sooner than planned today.

## 2020-10-16 DIAGNOSIS — Z23 Encounter for immunization: Secondary | ICD-10-CM | POA: Diagnosis not present

## 2020-10-19 DIAGNOSIS — Z20822 Contact with and (suspected) exposure to covid-19: Secondary | ICD-10-CM | POA: Diagnosis not present

## 2020-10-23 ENCOUNTER — Other Ambulatory Visit: Payer: Self-pay | Admitting: Family Medicine

## 2020-10-23 DIAGNOSIS — E876 Hypokalemia: Secondary | ICD-10-CM

## 2020-10-28 DIAGNOSIS — M2042 Other hammer toe(s) (acquired), left foot: Secondary | ICD-10-CM | POA: Diagnosis not present

## 2020-10-28 DIAGNOSIS — M2041 Other hammer toe(s) (acquired), right foot: Secondary | ICD-10-CM | POA: Diagnosis not present

## 2020-12-15 DIAGNOSIS — Z20822 Contact with and (suspected) exposure to covid-19: Secondary | ICD-10-CM | POA: Diagnosis not present

## 2020-12-18 DIAGNOSIS — I10 Essential (primary) hypertension: Secondary | ICD-10-CM | POA: Diagnosis not present

## 2020-12-18 DIAGNOSIS — I2510 Atherosclerotic heart disease of native coronary artery without angina pectoris: Secondary | ICD-10-CM | POA: Diagnosis not present

## 2020-12-18 DIAGNOSIS — E785 Hyperlipidemia, unspecified: Secondary | ICD-10-CM | POA: Diagnosis not present

## 2021-01-05 ENCOUNTER — Ambulatory Visit (INDEPENDENT_AMBULATORY_CARE_PROVIDER_SITE_OTHER): Payer: Medicare Other

## 2021-01-05 VITALS — BP 130/68 | HR 78 | Temp 97.8°F | Ht 63.0 in | Wt 183.6 lb

## 2021-01-05 DIAGNOSIS — Z0000 Encounter for general adult medical examination without abnormal findings: Secondary | ICD-10-CM | POA: Diagnosis not present

## 2021-01-05 NOTE — Patient Instructions (Signed)
Kara Diaz , Thank you for taking time to come for your Medicare Wellness Visit. I appreciate your ongoing commitment to your health goals. Please review the following plan we discussed and let me know if I can assist you in the future.   Screening recommendations/referrals: Colonoscopy: not required Mammogram: completed 02/06/2020 Bone Density: completed 06/12/2020 Recommended yearly ophthalmology/optometry visit for glaucoma screening and checkup Recommended yearly dental visit for hygiene and checkup  Vaccinations: Influenza vaccine: completed 10/16/2020 Pneumococcal vaccine: completed 02/12/2015 Tdap vaccine: completed 09/05/2013, due 09/06/2023 Shingles vaccine: discussed   Covid-19: 10/16/2020,05/14/2020, 10/23/2019, 03/09/2019, 02/16/2019  Advanced directives: Advance directive discussed with you today. Even though you declined this today please call our office should you change your mind and we can give you the proper paperwork for you to fill out.  Conditions/risks identified: none  Next appointment: Follow up in one year for your annual wellness visit    Preventive Care 65 Years and Older, Female Preventive care refers to lifestyle choices and visits with your health care provider that can promote health and wellness. What does preventive care include? A yearly physical exam. This is also called an annual well check. Dental exams once or twice a year. Routine eye exams. Ask your health care provider how often you should have your eyes checked. Personal lifestyle choices, including: Daily care of your teeth and gums. Regular physical activity. Eating a healthy diet. Avoiding tobacco and drug use. Limiting alcohol use. Practicing safe sex. Taking low-dose aspirin every day. Taking vitamin and mineral supplements as recommended by your health care provider. What happens during an annual well check? The services and screenings done by your health care provider during your annual well  check will depend on your age, overall health, lifestyle risk factors, and family history of disease. Counseling  Your health care provider may ask you questions about your: Alcohol use. Tobacco use. Drug use. Emotional well-being. Home and relationship well-being. Sexual activity. Eating habits. History of falls. Memory and ability to understand (cognition). Work and work Statistician. Reproductive health. Screening  You may have the following tests or measurements: Height, weight, and BMI. Blood pressure. Lipid and cholesterol levels. These may be checked every 5 years, or more frequently if you are over 21 years old. Skin check. Lung cancer screening. You may have this screening every year starting at age 98 if you have a 30-pack-year history of smoking and currently smoke or have quit within the past 15 years. Fecal occult blood test (FOBT) of the stool. You may have this test every year starting at age 40. Flexible sigmoidoscopy or colonoscopy. You may have a sigmoidoscopy every 5 years or a colonoscopy every 10 years starting at age 27. Hepatitis C blood test. Hepatitis B blood test. Sexually transmitted disease (STD) testing. Diabetes screening. This is done by checking your blood sugar (glucose) after you have not eaten for a while (fasting). You may have this done every 1-3 years. Bone density scan. This is done to screen for osteoporosis. You may have this done starting at age 4. Mammogram. This may be done every 1-2 years. Talk to your health care provider about how often you should have regular mammograms. Talk with your health care provider about your test results, treatment options, and if necessary, the need for more tests. Vaccines  Your health care provider may recommend certain vaccines, such as: Influenza vaccine. This is recommended every year. Tetanus, diphtheria, and acellular pertussis (Tdap, Td) vaccine. You may need a Td booster every 10  years. Zoster  vaccine. You may need this after age 46. Pneumococcal 13-valent conjugate (PCV13) vaccine. One dose is recommended after age 27. Pneumococcal polysaccharide (PPSV23) vaccine. One dose is recommended after age 22. Talk to your health care provider about which screenings and vaccines you need and how often you need them. This information is not intended to replace advice given to you by your health care provider. Make sure you discuss any questions you have with your health care provider. Document Released: 01/23/2015 Document Revised: 09/16/2015 Document Reviewed: 10/28/2014 Elsevier Interactive Patient Education  2017 [place] Prevention in the Home Falls can cause injuries. They can happen to people of all ages. There are many things you can do to make your home safe and to help prevent falls. What can I do on the outside of my home? Regularly fix the edges of walkways and driveways and fix any cracks. Remove anything that might make you trip as you walk through a door, such as a raised step or threshold. Trim any bushes or trees on the path to your home. Use bright outdoor lighting. Clear any walking paths of anything that might make someone trip, such as rocks or tools. Regularly check to see if handrails are loose or broken. Make sure that both sides of any steps have handrails. Any raised decks and porches should have guardrails on the edges. Have any leaves, snow, or ice cleared regularly. Use sand or salt on walking paths during winter. Clean up any spills in your garage right away. This includes oil or grease spills. What can I do in the bathroom? Use night lights. Install grab bars by the toilet and in the tub and shower. Do not use towel bars as grab bars. Use non-skid mats or decals in the tub or shower. If you need to sit down in the shower, use a plastic, non-slip stool. Keep the floor dry. Clean up any water that spills on the floor as soon as it happens. Remove  soap buildup in the tub or shower regularly. Attach bath mats securely with double-sided non-slip rug tape. Do not have throw rugs and other things on the floor that can make you trip. What can I do in the bedroom? Use night lights. Make sure that you have a light by your bed that is easy to reach. Do not use any sheets or blankets that are too big for your bed. They should not hang down onto the floor. Have a firm chair that has side arms. You can use this for support while you get dressed. Do not have throw rugs and other things on the floor that can make you trip. What can I do in the kitchen? Clean up any spills right away. Avoid walking on wet floors. Keep items that you use a lot in easy-to-reach places. If you need to reach something above you, use a strong step stool that has a grab bar. Keep electrical cords out of the way. Do not use floor polish or wax that makes floors slippery. If you must use wax, use non-skid floor wax. Do not have throw rugs and other things on the floor that can make you trip. What can I do with my stairs? Do not leave any items on the stairs. Make sure that there are handrails on both sides of the stairs and use them. Fix handrails that are broken or loose. Make sure that handrails are as long as the stairways. Check any carpeting to make sure  that it is firmly attached to the stairs. Fix any carpet that is loose or worn. Avoid having throw rugs at the top or bottom of the stairs. If you do have throw rugs, attach them to the floor with carpet tape. Make sure that you have a light switch at the top of the stairs and the bottom of the stairs. If you do not have them, ask someone to add them for you. What else can I do to help prevent falls? Wear shoes that: Do not have high heels. Have rubber bottoms. Are comfortable and fit you well. Are closed at the toe. Do not wear sandals. If you use a stepladder: Make sure that it is fully opened. Do not climb a  closed stepladder. Make sure that both sides of the stepladder are locked into place. Ask someone to hold it for you, if possible. Clearly mark and make sure that you can see: Any grab bars or handrails. First and last steps. Where the edge of each step is. Use tools that help you move around (mobility aids) if they are needed. These include: Canes. Walkers. Scooters. Crutches. Turn on the lights when you go into a dark area. Replace any light bulbs as soon as they burn out. Set up your furniture so you have a clear path. Avoid moving your furniture around. If any of your floors are uneven, fix them. If there are any pets around you, be aware of where they are. Review your medicines with your doctor. Some medicines can make you feel dizzy. This can increase your chance of falling. Ask your doctor what other things that you can do to help prevent falls. This information is not intended to replace advice given to you by your health care provider. Make sure you discuss any questions you have with your health care provider. Document Released: 10/23/2008 Document Revised: 06/04/2015 Document Reviewed: 01/31/2014 Elsevier Interactive Patient Education  2017 [employer].

## 2021-01-05 NOTE — Progress Notes (Signed)
This visit occurred during the SARS-CoV-2 public health emergency.  Safety protocols were in place, including screening questions prior to the visit, additional usage of staff PPE, and extensive cleaning of exam room while observing appropriate contact time as indicated for disinfecting solutions.  Subjective:   Kara Diaz is a 80 y.o. female who presents for an Initial Medicare Annual Wellness Visit.  Review of Systems     Cardiac Risk Factors include: advanced age (>55men, >88 women);hypertension;obesity (BMI >30kg/m2)     Objective:    Today's Vitals   01/05/21 1047  BP: 130/68  Pulse: 78  Temp: 97.8 F (36.6 C)  TempSrc: Oral  SpO2: 95%  Weight: 183 lb 9.6 oz (83.3 kg)  Height: 5\' 3"  (1.6 m)   Body mass index is 32.52 kg/m.  Advanced Directives 01/05/2021 07/19/2019 10/31/2018 05/11/2016 09/11/2015 02/12/2015 08/01/2014  Does Patient Have a Medical Advance Directive? No No No No No No No  Would patient like information on creating a medical advance directive? No - Patient declined Yes (ED - Information included in AVS) No - Patient declined - No - patient declined information No - patient declined information No - patient declined information  Pre-existing out of facility DNR order (yellow form or pink MOST form) - - - - - - -    Current Medications (verified) Outpatient Encounter Medications as of 01/05/2021  Medication Sig   amLODipine-benazepril (LOTREL) 10-40 MG capsule Take 1 capsule by mouth daily.   aspirin EC 81 MG tablet Take 81 mg by mouth daily.   atorvastatin (LIPITOR) 40 MG tablet Take 1 tablet (40 mg total) by mouth daily.   Cholecalciferol (VITAMIN D3) 2000 UNITS TABS Take 2,000 Units by mouth daily.    cycloSPORINE (RESTASIS) 0.05 % ophthalmic emulsion Place 1 drop into both eyes 2 (two) times daily.   KLOR-CON M20 20 MEQ tablet TAKE 1 TABLET DAILY   L-Methylfolate-Algae-B12-B6 (METANX) 3-90.314-2-35 MG CAPS Take 1 capsule by mouth 2 (two) times daily.    Multiple Vitamin (MULTIVITAMIN) tablet Take 1 tablet by mouth daily.   nystatin (MYCOSTATIN/NYSTOP) powder Apply 1 application topically 3 (three) times daily.   nystatin-triamcinolone (MYCOLOG II) cream Apply 1 application topically 2 (two) times daily as needed.   triamterene-hydrochlorothiazide (DYAZIDE) 37.5-25 MG capsule Take 1 each (1 capsule total) by mouth every morning.   CALCIUM PO Take by mouth. Take 1 tablet by mouth. (Patient not taking: Reported on 01/05/2021)   MAGNESIUM PO Take by mouth. Take 1 tablet by mouth daily. (Patient not taking: Reported on 01/05/2021)   No facility-administered encounter medications on file as of 01/05/2021.    Allergies (verified) Patient has no known allergies.   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   Early cataracts, bilateral    Hemorrhoids    History of radiation therapy 06/07/11-07/11/11   left breast 50Gy/40fx   Hyperlipidemia    doesn't take any meds for this   Hypertension    takes Dyazide and Lotrel daily   Insomnia    but doesn't take anything for this   Joint pain    Joint swelling    Personal history of radiation therapy 2013   Pneumonia    hx of in 1992   PONV (postoperative nausea and vomiting)    Urinary frequency    Past Surgical History:  Procedure  Laterality Date   ABDOMINAL HYSTERECTOMY  1980   partial hysterectomy   BREAST BIOPSY Left 03/25/2011   BREAST LUMPECTOMY Left 05/09/2011   left breast lumpectomy   CARDIAC CATHETERIZATION  12/16/10   CHOLECYSTECTOMY  late 90's   COLONOSCOPY     JOINT REPLACEMENT Right 11/2011   knee replacement   knee replacement surgery     MYOMECTOMY     early 70's   rotator cuff surgery  1998   right   TONSILLECTOMY     as a child   Family History  Problem Relation Age of Onset   Hypertension Mother    Hypertension Father    Diabetes Father     Cancer Brother        prostate   Stroke Brother    Cancer Cousin    Cancer Cousin    Malignant hyperthermia Neg Hx    Hypotension Neg Hx    Pseudochol deficiency Neg Hx    Social History   Socioeconomic History   Marital status: Single    Spouse name: Not on file   Number of children: Not on file   Years of education: Not on file   Highest education level: Not on file  Occupational History   Not on file  Tobacco Use   Smoking status: Former    Types: Cigarettes    Quit date: 01/10/1990    Years since quitting: 31.0   Smokeless tobacco: Never   Tobacco comments:    quit in 1992  Vaping Use   Vaping Use: Never used  Substance and Sexual Activity   Alcohol use: Yes    Comment: glass a month   Drug use: No   Sexual activity: Not Currently    Birth control/protection: Surgical  Other Topics Concern   Not on file  Social History Narrative   Not on file   Social Determinants of Health   Financial Resource Strain: Low Risk    Difficulty of Paying Living Expenses: Not hard at all  Food Insecurity: No Food Insecurity   Worried About Charity fundraiser in the Last Year: Never true   Ran Out of Food in the Last Year: Never true  Transportation Needs: No Transportation Needs   Lack of Transportation (Medical): No   Lack of Transportation (Non-Medical): No  Physical Activity: Inactive   Days of Exercise per Week: 0 days   Minutes of Exercise per Session: 0 min  Stress: No Stress Concern Present   Feeling of Stress : Not at all  Social Connections: Not on file    Tobacco Counseling Counseling given: Not Answered Tobacco comments: quit in 1992   Clinical Intake:  Pre-visit preparation completed: Yes  Pain : No/denies pain     Nutritional Status: BMI > 30  Obese Nutritional Risks: None Diabetes: No  How often do you need to have someone help you when you read instructions, pamphlets, or other written materials from your doctor or pharmacy?: 1 - Never What  is the last grade level you completed in school?: college  Diabetic? no  Interpreter Needed?: No  Information entered by :: NAllen LPN   Activities of Daily Living In your present state of health, do you have any difficulty performing the following activities: 01/05/2021  Hearing? N  Vision? Y  Difficulty concentrating or making decisions? N  Walking or climbing stairs? N  Dressing or bathing? N  Doing errands, shopping? N  Preparing Food and eating ? N  Using the Toilet?  N  In the past six months, have you accidently leaked urine? N  Do you have problems with loss of bowel control? N  Managing your Medications? N  Managing your Finances? N  Housekeeping or managing your Housekeeping? N  Some recent data might be hidden    Patient Care Team: Martinique, Betty G, MD as PCP - General (Family Medicine)  Indicate any recent Medical Services you may have received from other than [facility] providers in the past year (date may be approximate).     Assessment:   This is a routine wellness examination for [place].  Hearing/Vision screen No results found.  Dietary issues and exercise activities discussed: Current Exercise Habits: The patient does not participate in regular exercise at present   Goals Addressed             This Visit's Progress    Patient Stated       01/05/2021, wants to lose weight       Depression Screen PHQ 2/9 Scores 01/05/2021 10/14/2020 02/21/2020 03/14/2016 02/12/2015 08/01/2014 05/29/2014  PHQ - 2 Score 0 0 0 1 0 0 0    Fall Risk Fall Risk  01/05/2021 10/14/2020 02/21/2020 03/14/2016 02/12/2015  Falls in the past year? 0 0 0 No No  Number falls in past yr: - 0 - - -  Injury with Fall? - 0 - - -  Risk for fall due to : Impaired balance/gait;Impaired mobility;Medication side effect Orthopedic patient;Impaired balance/gait - - -  Follow up Falls evaluation completed;Education provided;Falls prevention discussed Education provided - - -    FALL RISK  PREVENTION PERTAINING TO THE HOME:  Any stairs in or around the home? Yes  If so, are there any without handrails? No  Home free of loose throw rugs in walkways, pet beds, electrical cords, etc? Yes  Adequate lighting in your home to reduce risk of falls? Yes   ASSISTIVE DEVICES UTILIZED TO PREVENT FALLS:  Life alert? No  Use of a cane, walker or w/c? Yes  Grab bars in the bathroom? No  Shower chair or bench in shower? No  Elevated toilet seat or a handicapped toilet? Yes   TIMED UP AND GO:  Was the test performed? No .    Gait slow and steady with assistive device  Cognitive Function:     6CIT Screen 01/05/2021  What Year? 0 points  What month? 0 points  What time? 0 points  Count back from 20 0 points  Months in reverse 0 points  Repeat phrase 0 points  Total Score 0    Immunizations Immunization History  Administered Date(s) Administered   Influenza Whole 10/05/2011   Influenza, High Dose Seasonal PF 11/17/2016   Influenza,inj,Quad PF,6+ Mos 11/23/2012, 10/02/2013   PFIZER(Purple Top)SARS-COV-2 Vaccination 02/16/2019, 03/09/2019, 05/15/2019, 10/23/2019   Pneumococcal Conjugate-13 09/25/2010, 02/12/2015   Pneumococcal Polysaccharide-23 11/23/2012   Tdap 09/05/2013    TDAP status: Up to date  Flu Vaccine status: Up to date  Pneumococcal vaccine status: Up to date  Covid-19 vaccine status: Completed vaccines  Qualifies for Shingles Vaccine? Yes   Zostavax completed No   Shingrix Completed?: No.    Education has been provided regarding the importance of this vaccine. Patient has been advised to call insurance company to determine out of pocket expense if they have not yet received this vaccine. Advised may also receive vaccine at local pharmacy or Health Dept. Verbalized acceptance and understanding.  Screening Tests Health Maintenance  Topic Date Due   Hepatitis  C Screening  Never done   COVID-19 Vaccine (5 - Booster for Pfizer series) 12/18/2019    INFLUENZA VACCINE  08/10/2020   Zoster Vaccines- Shingrix (1 of 2) 01/15/2021 (Originally 04/22/1959)   TETANUS/TDAP  09/06/2023   Pneumonia Vaccine 67+ Years old  Completed   DEXA SCAN  Completed   HPV VACCINES  Aged Out    Health Maintenance  Health Maintenance Due  Topic Date Due   Hepatitis C Screening  Never done   COVID-19 Vaccine (5 - Booster for Pfizer series) 12/18/2019   INFLUENZA VACCINE  08/10/2020    Colorectal cancer screening: No longer required.   Mammogram status: No longer required due to 02/06/2020.  Bone Density status: Completed 06/12/2020.   Lung Cancer Screening: (Low Dose CT Chest recommended if Age 83-80 years, 30 pack-year currently smoking OR have quit w/in 15years.) does not qualify.   Lung Cancer Screening Referral: no  Additional Screening:  Hepatitis C Screening: does qualify;   Vision Screening: Recommended annual ophthalmology exams for early detection of glaucoma and other disorders of the eye. Is the patient up to date with their annual eye exam?  Yes  Who is the provider or what is the name of the office in which the patient attends annual eye exams? Dr. Edilia Bo If pt is not established with a provider, would they like to be referred to a provider to establish care? No .   Dental Screening: Recommended annual dental exams for proper oral hygiene  Community Resource Referral / Chronic Care Management: CRR required this visit?  No   CCM required this visit?  No      Plan:     I have personally reviewed and noted the following in the patients chart:   Medical and social history Use of alcohol, tobacco or illicit drugs  Current medications and supplements including opioid prescriptions. Patient is not currently taking opioid prescriptions. Functional ability and status Nutritional status Physical activity Advanced directives List of other physicians Hospitalizations, surgeries, and ER visits in previous 12  months Vitals Screenings to include cognitive, depression, and falls Referrals and appointments  In addition, I have reviewed and discussed with patient certain preventive protocols, quality metrics, and best practice recommendations. A written personalized care plan for preventive services as well as general preventive health recommendations were provided to patient.     Kellie Simmering, LPN   88/28/[id number]   Nurse Notes: none

## 2021-01-22 DIAGNOSIS — Z20822 Contact with and (suspected) exposure to covid-19: Secondary | ICD-10-CM | POA: Diagnosis not present

## 2021-01-29 DIAGNOSIS — Z96653 Presence of artificial knee joint, bilateral: Secondary | ICD-10-CM | POA: Diagnosis not present

## 2021-02-03 DIAGNOSIS — M79675 Pain in left toe(s): Secondary | ICD-10-CM | POA: Diagnosis not present

## 2021-02-03 DIAGNOSIS — L84 Corns and callosities: Secondary | ICD-10-CM | POA: Diagnosis not present

## 2021-02-03 DIAGNOSIS — M79674 Pain in right toe(s): Secondary | ICD-10-CM | POA: Diagnosis not present

## 2021-02-03 DIAGNOSIS — B351 Tinea unguium: Secondary | ICD-10-CM | POA: Diagnosis not present

## 2021-02-03 DIAGNOSIS — I70203 Unspecified atherosclerosis of native arteries of extremities, bilateral legs: Secondary | ICD-10-CM | POA: Diagnosis not present

## 2021-02-15 ENCOUNTER — Other Ambulatory Visit: Payer: Self-pay | Admitting: Cardiology

## 2021-02-15 ENCOUNTER — Other Ambulatory Visit: Payer: Self-pay | Admitting: Family Medicine

## 2021-02-15 DIAGNOSIS — Z1231 Encounter for screening mammogram for malignant neoplasm of breast: Secondary | ICD-10-CM

## 2021-03-10 DIAGNOSIS — Z20822 Contact with and (suspected) exposure to covid-19: Secondary | ICD-10-CM | POA: Diagnosis not present

## 2021-03-12 DIAGNOSIS — I10 Essential (primary) hypertension: Secondary | ICD-10-CM | POA: Diagnosis not present

## 2021-03-12 DIAGNOSIS — E785 Hyperlipidemia, unspecified: Secondary | ICD-10-CM | POA: Diagnosis not present

## 2021-03-12 DIAGNOSIS — I2510 Atherosclerotic heart disease of native coronary artery without angina pectoris: Secondary | ICD-10-CM | POA: Diagnosis not present

## 2021-03-16 NOTE — Progress Notes (Signed)
HPI: Kara Diaz is a 81 y.o. female, who is here today to follow on recent visit. She was last seen on 10/14/20.  Next appt with cardiologist on 06/11/21 . No new problems since her last visit.  Trigger fingers getting worse. 4th-5th right and now 5th left finger.We discussed this problem last visit, at that time she was not interested in referral to sport medicine to discussed steroid injections.  At night occasional tingling sensation on some areas of hands, alleviated by shaking and changing position. No edema,erythema,or weakness.  HTN: Dx'ed at age 36. She is on Amlodipine-Benazepril 10-40 mg daily and triamterene-.HCTZ 37.5-25 mg daily. She is not checking BP regularly, states that at her cardiologist's office SBP was 140's. Negative for CP,SOB,orthopnea,PND,focal weakness,or edema. HypoK+: She is on KLOR 20 meq daily.  Lab Results  Component Value Date   CREATININE 0.69 06/26/2020   BUN 13 06/26/2020   NA 143 06/26/2020   K 3.6 06/26/2020   CL 104 06/26/2020   CO2 28 06/26/2020   Hypercalcemia, stable for years. She has not noted muscle spasms,abdominal pain,N/V, changes in bowel habits,mood or MS changes.  Vit D def: She is on Vit D 2000 U daily.  Lab Results  Component Value Date   PTH 66 03/27/2020   CALCIUM 11.6 (H) 06/26/2020   CAION 5.81 (H) 02/12/2020   PHOS 2.8 03/27/2020   Lab Results  Component Value Date   WBC 3.0 (L) 02/12/2020   HGB 13.7 02/12/2020   HCT 40.6 02/12/2020   MCV 95.0 02/12/2020   PLT 269.0 02/12/2020   HLD and aortic atherosclerosis seen on imaging. Her cardiologist would like FLP and LFT's done. She is on Atorvastatin 40 mg daily and Aspirin 81 mg daily. Following low fat/healthful diet.  Lab Results  Component Value Date   CHOL 157 09/11/2015   HDL 96 09/11/2015   LDLCALC 49 09/11/2015   TRIG 61 09/11/2015   CHOLHDL 1.6 09/11/2015   Review of Systems  Constitutional:  Negative for activity change, appetite  change and fever.  HENT:  Negative for mouth sores, nosebleeds and sore throat.   Eyes:  Negative for redness and visual disturbance.  Respiratory:  Negative for cough and wheezing.   Genitourinary:  Negative for decreased urine volume and hematuria.  Musculoskeletal:  Positive for arthralgias and gait problem.  Skin:  Negative for pallor and rash.  Neurological:  Negative for syncope, weakness and headaches.  Rest see pertinent positives and negatives per HPI.  Current Outpatient Medications on File Prior to Visit  Medication Sig Dispense Refill   amLODipine-benazepril (LOTREL) 10-40 MG capsule Take 1 capsule by mouth daily. 90 capsule 3   aspirin EC 81 MG tablet Take 81 mg by mouth daily.     atorvastatin (LIPITOR) 40 MG tablet Take 1 tablet (40 mg total) by mouth daily. 90 tablet 3   CALCIUM PO Take by mouth. Take 1 tablet by mouth.     Cholecalciferol (VITAMIN D3) 2000 UNITS TABS Take 2,000 Units by mouth daily.      cycloSPORINE (RESTASIS) 0.05 % ophthalmic emulsion Place 1 drop into both eyes 2 (two) times daily.     KLOR-CON M20 20 MEQ tablet TAKE 1 TABLET DAILY 90 tablet 3   L-Methylfolate-Algae-B12-B6 (METANX) 3-90.314-2-35 MG CAPS Take 1 capsule by mouth 2 (two) times daily.     MAGNESIUM PO Take by mouth. Take 1 tablet by mouth daily.     Multiple Vitamin (MULTIVITAMIN) tablet Take 1 tablet by  mouth daily.     nystatin (MYCOSTATIN/NYSTOP) powder Apply 1 application topically 3 (three) times daily. 15 g 2   nystatin-triamcinolone (MYCOLOG II) cream Apply 1 application topically 2 (two) times daily as needed. 45 g 1   triamterene-hydrochlorothiazide (DYAZIDE) 37.5-25 MG capsule Take 1 each (1 capsule total) by mouth every morning. 90 capsule 3   No current facility-administered medications on file prior to visit.    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   Early  cataracts, bilateral    Hemorrhoids    History of radiation therapy 06/07/11-07/11/11   left breast 50Gy/67f   Hyperlipidemia    doesn't take any meds for this   Hypertension    takes Dyazide and Lotrel daily   Insomnia    but doesn't take anything for this   Joint pain    Joint swelling    Personal history of radiation therapy 2013   Pneumonia    hx of in 1992   PONV (postoperative nausea and vomiting)    Urinary frequency    No Known Allergies  Social History   Socioeconomic History   Marital status: Single    Spouse name: Not on file   Number of children: Not on file   Years of education: Not on file   Highest education level: Not on file  Occupational History   Not on file  Tobacco Use   Smoking status: Former    Types: Cigarettes    Quit date: 01/10/1990    Years since quitting: 31.2   Smokeless tobacco: Never   Tobacco comments:    quit in 1992  Vaping Use   Vaping Use: Never used  Substance and Sexual Activity   Alcohol use: Yes    Comment: glass a month   Drug use: No   Sexual activity: Not Currently    Birth control/protection: Surgical  Other Topics Concern   Not on file  Social History Narrative   Not on file   Social Determinants of Health   Financial Resource Strain: Low Risk    Difficulty of Paying Living Expenses: Not hard at all  Food Insecurity: No Food Insecurity   Worried About RCharity fundraiserin the Last Year: Never true   Ran Out of Food in the Last Year: Never true  Transportation Needs: No Transportation Needs   Lack of Transportation (Medical): No   Lack of Transportation (Non-Medical): No  Physical Activity: Inactive   Days of Exercise per Week: 0 days   Minutes of Exercise per Session: 0 min  Stress: No Stress Concern Present   Feeling of Stress : Not at all  Social Connections: Not on file   Vitals:   03/17/21 1000  BP: 136/80  Pulse: 68  Resp: 16   Wt Readings from Last 3 Encounters:  03/17/21 179 lb 4 oz (81.3 kg)   01/05/21 183 lb 9.6 oz (83.3 kg)  10/14/20 190 lb 6 oz (86.4 kg)   Body mass index is 31.75 kg/m.  Physical Exam Vitals and nursing note reviewed.  Constitutional:      General: She is not in acute distress.    Appearance: She is well-developed.  HENT:     Head: Normocephalic and atraumatic.     Mouth/Throat:     Mouth: Mucous membranes are moist.     Pharynx:  Oropharynx is clear.  Eyes:     Conjunctiva/sclera: Conjunctivae normal.  Cardiovascular:     Rate and Rhythm: Normal rate and regular rhythm.     Pulses:          Dorsalis pedis pulses are 2+ on the right side and 2+ on the left side.     Heart sounds: Murmur (SEM I-II/VI RUSB and LUSB) heard.  Pulmonary:     Effort: Pulmonary effort is normal. No respiratory distress.     Breath sounds: Normal breath sounds.  Abdominal:     Palpations: Abdomen is soft. There is no hepatomegaly or mass.     Tenderness: There is no abdominal tenderness.  Musculoskeletal:     Comments: Right 5th finger with clicking sensation upon flexion and extension movement. No tenderness upon palpation. Tinel and Phalen negative, bilateral.   Lymphadenopathy:     Cervical: No cervical adenopathy.  Skin:    General: Skin is warm.     Findings: No erythema or rash.  Neurological:     General: No focal deficit present.     Mental Status: She is alert and oriented to person, place, and time.     Cranial Nerves: No cranial nerve deficit.     Comments: Unstable, antalgic gait assisted with a cane.  Psychiatric:     Comments: Well groomed, good eye contact.   ASSESSMENT AND PLAN:  Ms.Elishia was seen today for follow-up.  Diagnoses and all orders for this visit: Orders Placed This Encounter  Procedures   Protein Electrophoresis,Random Urn   Protein Electrophoresis, Serum   CBC with Differential/Platelet   Basic metabolic panel   Hepatic function panel   Parathyroid hormone, intact (no Ca)   VITAMIN D 25 Hydroxy (Vit-D Deficiency,  Fractures)   Lipid panel   Lab Results  Component Value Date   CREATININE 0.68 03/17/2021   BUN 9 03/17/2021   NA 141 03/17/2021   K 3.3 (L) 03/17/2021   CL 102 03/17/2021   CO2 30 03/17/2021   Lab Results  Component Value Date   CHOL 191 03/17/2021   HDL 89.60 03/17/2021   LDLCALC 88 03/17/2021   TRIG 65.0 03/17/2021   CHOLHDL 2 03/17/2021   Lab Results  Component Value Date   ALT 19 03/17/2021   AST 26 03/17/2021   ALKPHOS 87 03/17/2021   BILITOT 1.5 (H) 03/17/2021   Lab Results  Component Value Date   PTH 75 03/17/2021   CALCIUM 11.1 (H) 03/17/2021             Lab Results  Component Value Date   WBC 2.5 (L) 03/17/2021   HGB 13.6 03/17/2021   HCT 41.1 03/17/2021   MCV 96.6 03/17/2021   PLT 232.0 03/17/2021   Resistant hypertension BP adequately controlled. Amlodipine-Benazepril 10-40 mg daily and triamterene-.HCTZ 37.5-25 mg daily. Continue low salt diet.  Hypokalemia Last K+ 3.6 in 06/2020. Continue KLOR 20 meq daily. Further recommendations according to BMP results.  Aortic atherosclerosis (HCC) Continue Atorvastatin 40 mg daily and Aspirin 81 mg daiy, both have been well tolerated.  Vitamin D deficiency, unspecified Continue Vit D 2000 U daily. Further recommendations according to 25 OH vit D result.  Hypercalcemia Problem is chronic and has been stable, primary hyperparathyroidism. We will continue following regularly.  Hyperlipidemia with target LDL less than 100 Continue Atorvastatin 40 mg daily and low fat diet. Further recommendations according to lipid panel result.  Class 1 obesity with serious comorbidity and body mass index (BMI) of  31.0 to 31.9 in adult, unspecified obesity type We discussed benefits of wt loss . She has lost about 11 Lb since 10/2020. Consistency with healthy diet and low impact physical activity as tolerated encouraged.  Return in about 6 months (around 09/17/2021).  Dayannara Pascal G. Martinique, MD  [facility]. [place] office.

## 2021-03-17 ENCOUNTER — Encounter: Payer: Self-pay | Admitting: Family Medicine

## 2021-03-17 ENCOUNTER — Ambulatory Visit (INDEPENDENT_AMBULATORY_CARE_PROVIDER_SITE_OTHER): Payer: Medicare Other | Admitting: Family Medicine

## 2021-03-17 VITALS — BP 136/80 | HR 68 | Resp 16 | Ht 63.0 in | Wt 179.2 lb

## 2021-03-17 DIAGNOSIS — I700 Atherosclerosis of aorta: Secondary | ICD-10-CM | POA: Diagnosis not present

## 2021-03-17 DIAGNOSIS — I10 Essential (primary) hypertension: Secondary | ICD-10-CM

## 2021-03-17 DIAGNOSIS — Z6831 Body mass index (BMI) 31.0-31.9, adult: Secondary | ICD-10-CM

## 2021-03-17 DIAGNOSIS — E785 Hyperlipidemia, unspecified: Secondary | ICD-10-CM

## 2021-03-17 DIAGNOSIS — E669 Obesity, unspecified: Secondary | ICD-10-CM | POA: Diagnosis not present

## 2021-03-17 DIAGNOSIS — E66811 Body mass index (BMI) 31.0-31.9, adult: Secondary | ICD-10-CM

## 2021-03-17 DIAGNOSIS — E876 Hypokalemia: Secondary | ICD-10-CM

## 2021-03-17 DIAGNOSIS — E559 Vitamin D deficiency, unspecified: Secondary | ICD-10-CM | POA: Diagnosis not present

## 2021-03-17 DIAGNOSIS — I1A0 Resistant hypertension: Secondary | ICD-10-CM

## 2021-03-17 LAB — VITAMIN D 25 HYDROXY (VIT D DEFICIENCY, FRACTURES): VITD: 69.85 ng/mL (ref 30.00–100.00)

## 2021-03-17 LAB — CBC WITH DIFFERENTIAL/PLATELET
Basophils Absolute: 0 10*3/uL (ref 0.0–0.1)
Basophils Relative: 1 % (ref 0.0–3.0)
Eosinophils Absolute: 0.1 10*3/uL (ref 0.0–0.7)
Eosinophils Relative: 3.4 % (ref 0.0–5.0)
HCT: 41.1 % (ref 36.0–46.0)
Hemoglobin: 13.6 g/dL (ref 12.0–15.0)
Lymphocytes Relative: 44.8 % (ref 12.0–46.0)
Lymphs Abs: 1.1 10*3/uL (ref 0.7–4.0)
MCHC: 33 g/dL (ref 30.0–36.0)
MCV: 96.6 fl (ref 78.0–100.0)
Monocytes Absolute: 0.2 10*3/uL (ref 0.1–1.0)
Monocytes Relative: 6.6 % (ref 3.0–12.0)
Neutro Abs: 1.1 10*3/uL — ABNORMAL LOW (ref 1.4–7.7)
Neutrophils Relative %: 44.2 % (ref 43.0–77.0)
Platelets: 232 10*3/uL (ref 150.0–400.0)
RBC: 4.25 Mil/uL (ref 3.87–5.11)
RDW: 13.3 % (ref 11.5–15.5)
WBC: 2.5 10*3/uL — ABNORMAL LOW (ref 4.0–10.5)

## 2021-03-17 LAB — HEPATIC FUNCTION PANEL
ALT: 19 U/L (ref 0–35)
AST: 26 U/L (ref 0–37)
Albumin: 4.6 g/dL (ref 3.5–5.2)
Alkaline Phosphatase: 87 U/L (ref 39–117)
Bilirubin, Direct: 0.2 mg/dL (ref 0.0–0.3)
Total Bilirubin: 1.5 mg/dL — ABNORMAL HIGH (ref 0.2–1.2)
Total Protein: 7.8 g/dL (ref 6.0–8.3)

## 2021-03-17 LAB — LIPID PANEL
Cholesterol: 191 mg/dL (ref 0–200)
HDL: 89.6 mg/dL (ref >39.00)
LDL Cholesterol: 88 mg/dL (ref 0–99)
NonHDL: 100.91
Total CHOL/HDL Ratio: 2
Triglycerides: 65 mg/dL (ref 0.0–149.0)
VLDL: 13 mg/dL (ref 0.0–40.0)

## 2021-03-17 LAB — BASIC METABOLIC PANEL
BUN: 9 mg/dL (ref 6–23)
CO2: 30 mEq/L (ref 19–32)
Calcium: 11.1 mg/dL — ABNORMAL HIGH (ref 8.4–10.5)
Chloride: 102 mEq/L (ref 96–112)
Creatinine, Ser: 0.68 mg/dL (ref 0.40–1.20)
GFR: 82.56 mL/min (ref >60.00)
Glucose, Bld: 83 mg/dL (ref 70–99)
Potassium: 3.3 mEq/L — ABNORMAL LOW (ref 3.5–5.1)
Sodium: 141 mEq/L (ref 135–145)

## 2021-03-17 NOTE — Patient Instructions (Addendum)
A few things to remember from today's visit: ? ?Resistant hypertension - Plan: CBC with Differential/Platelet, Basic metabolic panel, Hepatic function panel ? ?Hypokalemia ? ?Aortic atherosclerosis ([place]), Chronic ? ?Vitamin D deficiency, unspecified - Plan: VITAMIN D 25 Hydroxy (Vit-D Deficiency, Fractures) ? ?Hypercalcemia - Plan: Protein Electrophoresis,Random Urn, Protein Electrophoresis, Serum, CBC with Differential/Platelet, Hepatic function panel, Parathyroid hormone, intact (no Ca) ? ?Hyperlipidemia with target LDL less than 100 - Plan: Lipid panel ? ?If you need refills please call your pharmacy. ?Do not use My Chart to request refills or for acute issues that need immediate attention. ?  ?No changes today. ?Will fax lab results to your cardiologist. ? ?Please be sure medication list is accurate. ?If a new problem present, please set up appointment sooner than planned today. ? ?Wrist splint at night will help with tingling hand sensation. ?Let mew know if you decide to go to sport med for trigger fingers treatments. ? ? ? ? ? ? ? ?

## 2021-03-19 ENCOUNTER — Ambulatory Visit
Admission: RE | Admit: 2021-03-19 | Discharge: 2021-03-19 | Disposition: A | Discharge status: Home / Self Care | Payer: Medicare Other | Source: Ambulatory Visit | Attending: Cardiology | Admitting: Cardiology

## 2021-03-19 DIAGNOSIS — Z1231 Encounter for screening mammogram for malignant neoplasm of breast: Secondary | ICD-10-CM

## 2021-03-22 LAB — PROTEIN ELECTROPHORESIS,RANDOM URN
Creatinine, Urine: 20 mg/dL (ref 20–275)
Protein/Creat Ratio: 250 mg/g creat — ABNORMAL HIGH (ref 24–184)
Protein/Creatinine Ratio: 0.25 mg/mg creat — ABNORMAL HIGH (ref 0.024–0.184)
Total Protein, Urine: 5 mg/dL (ref 5–24)

## 2021-03-22 LAB — PROTEIN ELECTROPHORESIS, SERUM
Albumin ELP: 4.4 g/dL (ref 3.8–4.8)
Alpha 1: 0.4 g/dL — ABNORMAL HIGH (ref 0.2–0.3)
Alpha 2: 0.7 g/dL (ref 0.5–0.9)
Beta 2: 0.4 g/dL (ref 0.2–0.5)
Beta Globulin: 0.4 g/dL (ref 0.4–0.6)
Gamma Globulin: 1.2 g/dL (ref 0.8–1.7)
Total Protein: 7.6 g/dL (ref 6.1–8.1)

## 2021-03-22 LAB — PARATHYROID HORMONE, INTACT (NO CA): PTH: 75 pg/mL (ref 16–77)

## 2021-03-24 DIAGNOSIS — Z20822 Contact with and (suspected) exposure to covid-19: Secondary | ICD-10-CM | POA: Diagnosis not present

## 2021-04-12 DIAGNOSIS — Z20822 Contact with and (suspected) exposure to covid-19: Secondary | ICD-10-CM | POA: Diagnosis not present

## 2021-04-19 DIAGNOSIS — Z20822 Contact with and (suspected) exposure to covid-19: Secondary | ICD-10-CM | POA: Diagnosis not present

## 2021-04-20 DIAGNOSIS — Z20822 Contact with and (suspected) exposure to covid-19: Secondary | ICD-10-CM | POA: Diagnosis not present

## 2021-05-06 ENCOUNTER — Telehealth: Payer: Self-pay | Admitting: Family Medicine

## 2021-05-11 DIAGNOSIS — Z20822 Contact with and (suspected) exposure to covid-19: Secondary | ICD-10-CM | POA: Diagnosis not present

## 2021-05-14 DIAGNOSIS — Z20822 Contact with and (suspected) exposure to covid-19: Secondary | ICD-10-CM | POA: Diagnosis not present

## 2021-05-19 DIAGNOSIS — I739 Peripheral vascular disease, unspecified: Secondary | ICD-10-CM | POA: Diagnosis not present

## 2021-05-19 DIAGNOSIS — B351 Tinea unguium: Secondary | ICD-10-CM | POA: Diagnosis not present

## 2021-05-19 DIAGNOSIS — M2041 Other hammer toe(s) (acquired), right foot: Secondary | ICD-10-CM | POA: Diagnosis not present

## 2021-05-19 DIAGNOSIS — L84 Corns and callosities: Secondary | ICD-10-CM | POA: Diagnosis not present

## 2021-05-19 DIAGNOSIS — M2042 Other hammer toe(s) (acquired), left foot: Secondary | ICD-10-CM | POA: Diagnosis not present

## 2021-05-19 DIAGNOSIS — M79674 Pain in right toe(s): Secondary | ICD-10-CM | POA: Diagnosis not present

## 2021-05-19 DIAGNOSIS — G629 Polyneuropathy, unspecified: Secondary | ICD-10-CM | POA: Diagnosis not present

## 2021-05-19 DIAGNOSIS — M79675 Pain in left toe(s): Secondary | ICD-10-CM | POA: Diagnosis not present

## 2021-05-19 DIAGNOSIS — I70203 Unspecified atherosclerosis of native arteries of extremities, bilateral legs: Secondary | ICD-10-CM | POA: Diagnosis not present

## 2021-06-11 DIAGNOSIS — I10 Essential (primary) hypertension: Secondary | ICD-10-CM | POA: Diagnosis not present

## 2021-06-11 DIAGNOSIS — E785 Hyperlipidemia, unspecified: Secondary | ICD-10-CM | POA: Diagnosis not present

## 2021-06-11 DIAGNOSIS — I2510 Atherosclerotic heart disease of native coronary artery without angina pectoris: Secondary | ICD-10-CM | POA: Diagnosis not present

## 2021-06-25 NOTE — Telephone Encounter (Signed)
error 

## 2021-07-28 DIAGNOSIS — I70203 Unspecified atherosclerosis of native arteries of extremities, bilateral legs: Secondary | ICD-10-CM | POA: Diagnosis not present

## 2021-07-28 DIAGNOSIS — M79674 Pain in right toe(s): Secondary | ICD-10-CM | POA: Diagnosis not present

## 2021-07-28 DIAGNOSIS — M2041 Other hammer toe(s) (acquired), right foot: Secondary | ICD-10-CM | POA: Diagnosis not present

## 2021-07-28 DIAGNOSIS — G629 Polyneuropathy, unspecified: Secondary | ICD-10-CM | POA: Diagnosis not present

## 2021-07-28 DIAGNOSIS — B351 Tinea unguium: Secondary | ICD-10-CM | POA: Diagnosis not present

## 2021-07-28 DIAGNOSIS — M2042 Other hammer toe(s) (acquired), left foot: Secondary | ICD-10-CM | POA: Diagnosis not present

## 2021-07-28 DIAGNOSIS — M79675 Pain in left toe(s): Secondary | ICD-10-CM | POA: Diagnosis not present

## 2021-07-28 DIAGNOSIS — L84 Corns and callosities: Secondary | ICD-10-CM | POA: Diagnosis not present

## 2021-09-10 DIAGNOSIS — I2510 Atherosclerotic heart disease of native coronary artery without angina pectoris: Secondary | ICD-10-CM | POA: Diagnosis not present

## 2021-09-10 DIAGNOSIS — I10 Essential (primary) hypertension: Secondary | ICD-10-CM | POA: Diagnosis not present

## 2021-09-10 DIAGNOSIS — E785 Hyperlipidemia, unspecified: Secondary | ICD-10-CM | POA: Diagnosis not present

## 2021-09-17 ENCOUNTER — Ambulatory Visit: Payer: Medicare Other | Admitting: Family Medicine

## 2021-09-22 DIAGNOSIS — Z23 Encounter for immunization: Secondary | ICD-10-CM | POA: Diagnosis not present

## 2021-10-08 DIAGNOSIS — H16143 Punctate keratitis, bilateral: Secondary | ICD-10-CM | POA: Diagnosis not present

## 2021-10-08 DIAGNOSIS — H04123 Dry eye syndrome of bilateral lacrimal glands: Secondary | ICD-10-CM | POA: Diagnosis not present

## 2021-10-08 DIAGNOSIS — H2513 Age-related nuclear cataract, bilateral: Secondary | ICD-10-CM | POA: Diagnosis not present

## 2021-10-13 DIAGNOSIS — Z23 Encounter for immunization: Secondary | ICD-10-CM | POA: Diagnosis not present

## 2021-10-13 NOTE — Progress Notes (Deleted)
HPI:  [name] is a 81 y.o. female, who is here today to follow on recent visit.  Review of Systems Rest see pertinent positives and negatives per HPI.  Current Outpatient Medications on File Prior to Visit  Medication Sig Dispense Refill   amLODipine-benazepril (LOTREL) 10-40 MG capsule Take 1 capsule by mouth daily. 90 capsule 3   aspirin EC 81 MG tablet Take 81 mg by mouth daily.     atorvastatin (LIPITOR) 40 MG tablet Take 1 tablet (40 mg total) by mouth daily. 90 tablet 3   CALCIUM PO Take by mouth. Take 1 tablet by mouth.     Cholecalciferol (VITAMIN D3) 2000 UNITS TABS Take 2,000 Units by mouth daily.      cycloSPORINE (RESTASIS) 0.05 % ophthalmic emulsion Place 1 drop into both eyes 2 (two) times daily.     KLOR-CON M20 20 MEQ tablet TAKE 1 TABLET DAILY 90 tablet 3   L-Methylfolate-Algae-B12-B6 (METANX) 3-90.314-2-35 MG CAPS Take 1 capsule by mouth 2 (two) times daily.     MAGNESIUM PO Take by mouth. Take 1 tablet by mouth daily.     Multiple Vitamin (MULTIVITAMIN) tablet Take 1 tablet by mouth daily.     nystatin (MYCOSTATIN/NYSTOP) powder Apply 1 application topically 3 (three) times daily. 15 g 2   nystatin-triamcinolone (MYCOLOG II) cream Apply 1 application topically 2 (two) times daily as needed. 45 g 1   triamterene-hydrochlorothiazide (DYAZIDE) 37.5-25 MG capsule Take 1 each (1 capsule total) by mouth every morning. 90 capsule 3   No current facility-administered medications on file prior to visit.    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   Early cataracts, bilateral    Hemorrhoids    History of radiation therapy 06/07/11-07/11/11   left breast 50Gy/70f   Hyperlipidemia    doesn't take any meds for this   Hypertension    takes Dyazide and Lotrel daily   Insomnia    but doesn't take anything for this   Joint pain    Joint  swelling    Personal history of radiation therapy 2013   Pneumonia    hx of in 1992   PONV (postoperative nausea and vomiting)    Urinary frequency    No Known Allergies  Social History   Socioeconomic History   Marital status: Single    Spouse name: Not on file   Number of children: Not on file   Years of education: Not on file   Highest education level: Not on file  Occupational History   Not on file  Tobacco Use   Smoking status: Former    Types: Cigarettes    Quit date: 01/10/1990    Years since quitting: 31.7   Smokeless tobacco: Never   Tobacco comments:    quit in 1992  Vaping Use   Vaping Use: Never used  Substance and Sexual Activity   Alcohol use: Yes    Comment: glass a month   Drug use: No   Sexual activity: Not Currently    Birth control/protection: Surgical  Other Topics Concern   Not on file  Social History Narrative   Not on file   Social Determinants of Health   Financial Resource Strain: Low Risk  (01/05/2021)   Overall Financial Resource Strain (CARDIA)    Difficulty of Paying Living Expenses:  Not hard at all  Food Insecurity: No Food Insecurity (01/05/2021)   Hunger Vital Sign    Worried About Running Out of Food in the Last Year: Never true    Ran Out of Food in the Last Year: Never true  Transportation Needs: No Transportation Needs (01/05/2021)   PRAPARE - Hydrologist (Medical): No    Lack of Transportation (Non-Medical): No  Physical Activity: Inactive (01/05/2021)   Exercise Vital Sign    Days of Exercise per Week: 0 days    Minutes of Exercise per Session: 0 min  Stress: No Stress Concern Present (01/05/2021)   [place]    Feeling of Stress : Not at all  Social Connections: Not on file    There were no vitals filed for this visit. There is no height or weight on file to calculate BMI.  Physical Exam  ASSESSMENT AND  PLAN:   There are no diagnoses linked to this encounter.  No orders of the defined types were placed in this encounter.   No problem-specific Assessment & Plan notes found for this encounter.   No follow-ups on file.   Betty G. Martinique, MD  [facility]. [place] office.

## 2021-10-15 ENCOUNTER — Ambulatory Visit: Payer: Medicare Other | Admitting: Family Medicine

## 2021-10-18 ENCOUNTER — Other Ambulatory Visit: Payer: Self-pay | Admitting: Family Medicine

## 2021-10-18 DIAGNOSIS — E876 Hypokalemia: Secondary | ICD-10-CM

## 2021-10-20 NOTE — Progress Notes (Unsigned)
HPI: Ms.Tayah Naavya Postma is a 81 y.o. female, who is here today to follow on recent visit.  "The patient presents for a six-month follow-up visit. She reports that her blood pressure has been high throughout the summer, and she has been monitoring it at home. She mentions a recent reading of 179/88, although her blood pressure today is 132/88. The patient has seen a cardiologist who prescribed Losartan, but she has not started taking it due to concerns about a potential interaction with her current medications. She denies any chest pain, difficulty breathing, palpitations, or unusual headaches. The patient had an eye exam a couple of weeks ago with no concerning findings. She does not consume much salt but admits to a preference for sweets. She is currently taking K-Lor 20 mEq daily for her potassium levels, which were last measured at 3.3."  Lab Results  Component Value Date   CREATININE 0.68 03/17/2021   BUN 9 03/17/2021   NA 141 03/17/2021   K 3.3 (L) 03/17/2021   CL 102 03/17/2021   CO2 30 03/17/2021    Review of Systems Rest see pertinent positives and negatives per HPI.  Current Outpatient Medications on File Prior to Visit  Medication Sig Dispense Refill   amLODipine-benazepril (LOTREL) 10-40 MG capsule Take 1 capsule by mouth daily. 90 capsule 3   aspirin EC 81 MG tablet Take 81 mg by mouth daily.     atorvastatin (LIPITOR) 40 MG tablet Take 1 tablet (40 mg total) by mouth daily. 90 tablet 3   CALCIUM PO Take by mouth. Take 1 tablet by mouth.     Cholecalciferol (VITAMIN D3) 2000 UNITS TABS Take 2,000 Units by mouth daily.      cycloSPORINE (RESTASIS) 0.05 % ophthalmic emulsion Place 1 drop into both eyes 2 (two) times daily.     KLOR-CON M20 20 MEQ tablet TAKE 1 TABLET DAILY 90 tablet 3   L-Methylfolate-Algae-B12-B6 (METANX) 3-90.314-2-35 MG CAPS Take 1 capsule by mouth 2 (two) times daily.     MAGNESIUM PO Take by mouth. Take 1 tablet by mouth daily.     Multiple  Vitamin (MULTIVITAMIN) tablet Take 1 tablet by mouth daily.     nystatin (MYCOSTATIN/NYSTOP) powder Apply 1 application topically 3 (three) times daily. 15 g 2   nystatin-triamcinolone (MYCOLOG II) cream Apply 1 application topically 2 (two) times daily as needed. 45 g 1   triamterene-hydrochlorothiazide (DYAZIDE) 37.5-25 MG capsule Take 1 each (1 capsule total) by mouth every morning. 90 capsule 3   No current facility-administered medications on file prior to visit.    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   Early cataracts, bilateral    Hemorrhoids    History of radiation therapy 06/07/11-07/11/11   left breast 50Gy/68f   Hyperlipidemia    doesn't take any meds for this   Hypertension    takes Dyazide and Lotrel daily   Insomnia    but doesn't take anything for this   Joint pain    Joint swelling    Personal history of radiation therapy 2013   Pneumonia    hx of in 1992   PONV (postoperative nausea and vomiting)    Urinary frequency    No Known Allergies  Social History   Socioeconomic History   Marital status: Single    Spouse name: Not on  file   Number of children: Not on file   Years of education: Not on file   Highest education level: Not on file  Occupational History   Not on file  Tobacco Use   Smoking status: Former    Types: Cigarettes    Quit date: 01/10/1990    Years since quitting: 31.7   Smokeless tobacco: Never   Tobacco comments:    quit in 1992  Vaping Use   Vaping Use: Never used  Substance and Sexual Activity   Alcohol use: Yes    Comment: glass a month   Drug use: No   Sexual activity: Not Currently    Birth control/protection: Surgical  Other Topics Concern   Not on file  Social History Narrative   Not on file   Social Determinants of Health   Financial Resource Strain: Low Risk  (01/05/2021)   Overall Financial  Resource Strain (CARDIA)    Difficulty of Paying Living Expenses: Not hard at all  Food Insecurity: No Food Insecurity (01/05/2021)   Hunger Vital Sign    Worried About Running Out of Food in the Last Year: Never true    [place] in the Last Year: Never true  Transportation Needs: No Transportation Needs (01/05/2021)   PRAPARE - Hydrologist (Medical): No    Lack of Transportation (Non-Medical): No  Physical Activity: Inactive (01/05/2021)   Exercise Vital Sign    Days of Exercise per Week: 0 days    Minutes of Exercise per Session: 0 min  Stress: No Stress Concern Present (01/05/2021)   [place]    Feeling of Stress : Not at all  Social Connections: Not on file    There were no vitals filed for this visit. There is no height or weight on file to calculate BMI.  Physical Exam Vitals and nursing note reviewed.  Constitutional:      General: She is not in acute distress.    Appearance: She is well-developed.  HENT:     Head: Normocephalic and atraumatic.     Mouth/Throat:     Mouth: Mucous membranes are moist.     Pharynx: Oropharynx is clear.  Eyes:     Conjunctiva/sclera: Conjunctivae normal.  Cardiovascular:     Rate and Rhythm: Normal rate and regular rhythm.     Pulses:          Dorsalis pedis pulses are 2+ on the right side and 2+ on the left side.     Heart sounds: Murmur (SEM I-II/VI LUSB and RUSB) heard.  Pulmonary:     Effort: Pulmonary effort is normal. No respiratory distress.     Breath sounds: Normal breath sounds.  Abdominal:     Palpations: Abdomen is soft. There is no hepatomegaly or mass.     Tenderness: There is no abdominal tenderness.  Musculoskeletal:     Right lower leg: No edema.     Left lower leg: No edema.  Lymphadenopathy:     Cervical: No cervical adenopathy.  Skin:    General: Skin is warm.     Findings: No erythema or rash.   Neurological:     General: No focal deficit present.     Mental Status: She is alert and oriented to person, place, and time.     Cranial Nerves: No cranial nerve deficit.     Comments: Gait assisted by a cane.  Psychiatric:  Comments: Well groomed, good eye contact.     ASSESSMENT AND PLAN:   There are no diagnoses linked to this encounter.  No orders of the defined types were placed in this encounter.   No problem-specific Assessment & Plan notes found for this encounter.   No follow-ups on file.   Janiah Devinney G. Martinique, MD  [facility]. [place] office.

## 2021-10-22 ENCOUNTER — Encounter: Payer: Self-pay | Admitting: Family Medicine

## 2021-10-22 ENCOUNTER — Ambulatory Visit (INDEPENDENT_AMBULATORY_CARE_PROVIDER_SITE_OTHER): Payer: Medicare Other | Admitting: Family Medicine

## 2021-10-22 VITALS — BP 132/88 | HR 78 | Temp 97.9°F | Resp 18 | Ht 63.0 in | Wt 174.2 lb

## 2021-10-22 DIAGNOSIS — E876 Hypokalemia: Secondary | ICD-10-CM

## 2021-10-22 DIAGNOSIS — I1A0 Resistant hypertension: Secondary | ICD-10-CM

## 2021-10-22 LAB — MAGNESIUM: Magnesium: 1.9 mg/dL (ref 1.5–2.5)

## 2021-10-22 LAB — BASIC METABOLIC PANEL
BUN: 16 mg/dL (ref 6–23)
CO2: 32 mEq/L (ref 19–32)
Calcium: 11.5 mg/dL — ABNORMAL HIGH (ref 8.4–10.5)
Chloride: 100 mEq/L (ref 96–112)
Creatinine, Ser: 0.72 mg/dL (ref 0.40–1.20)
GFR: 78.92 mL/min (ref >60.00)
Glucose, Bld: 65 mg/dL — ABNORMAL LOW (ref 70–99)
Potassium: 3.2 mEq/L — ABNORMAL LOW (ref 3.5–5.1)
Sodium: 140 mEq/L (ref 135–145)

## 2021-10-22 MED ORDER — METOPROLOL SUCCINATE ER 25 MG PO TB24: 12.5000 mg | ORAL_TABLET | Freq: Every day | ORAL | 0 refills | Status: DC

## 2021-10-22 NOTE — Patient Instructions (Addendum)
A few things to remember from today's visit:   Hypokalemia - Plan: Magnesium, Basic metabolic panel  Resistant hypertension - Plan: metoprolol succinate (TOPROL-XL) 25 MG 24 hr tablet, Basic metabolic panel  1. Continue monitoring your blood pressure at home and record the readings. 2. Do not start Losartan, as it cannot be taken with Benazepril. 3. We will add Metoprolol to your medication regimen. Start with half a tablet (12.5 mg) once daily. Use a pill cutter for precision when splitting the tablet. 4. Ensure your medication list is accurate when visiting different providers, including your cardiologist. 5. Maintain a low-salt diet and continue taking your potassium supplement (K-Lor 20 mEq daily). 6. We will check your potassium levels today, as it was 3.3 last time. Continue monitoring your potassium levels and adjust the supplement dosage if needed. 7. Get your flu shot at the pharmacy, as per your preference.  I will see you in 6 months given the fact you are seeing your cardiologist in 11/2021.  If you need refills for medications you take chronically, please call your pharmacy. Do not use My Chart to request refills or for acute issues that need immediate attention. If you send a my chart message, it may take a few days to be addressed, specially if I am not in the office.  Please be sure medication list is accurate. If a new problem present, please set up appointment sooner than planned today.

## 2021-10-23 ENCOUNTER — Encounter: Payer: Self-pay | Admitting: Family Medicine

## 2021-10-25 ENCOUNTER — Other Ambulatory Visit: Payer: Self-pay

## 2021-10-25 MED ORDER — TRIAMTERENE-HCTZ 37.5-25 MG PO TABS: ORAL_TABLET | ORAL | 1 refills | Status: DC

## 2021-10-26 ENCOUNTER — Telehealth: Payer: Self-pay | Admitting: Family Medicine

## 2021-10-26 NOTE — Telephone Encounter (Signed)
Pt call and want you to give her a call  back to talk about what you  talked about yesterday.

## 2021-10-26 NOTE — Telephone Encounter (Signed)
I called and spoke with patient. She wanted to know what she could do if her blood sugar was low; advised she could have a piece of candy or drink a glass of orange juice to help boost it back up. Patient verbalized understanding.

## 2021-10-28 DIAGNOSIS — B372 Candidiasis of skin and nail: Secondary | ICD-10-CM | POA: Diagnosis not present

## 2021-10-28 DIAGNOSIS — I70203 Unspecified atherosclerosis of native arteries of extremities, bilateral legs: Secondary | ICD-10-CM | POA: Diagnosis not present

## 2021-10-28 DIAGNOSIS — M79675 Pain in left toe(s): Secondary | ICD-10-CM | POA: Diagnosis not present

## 2021-10-28 DIAGNOSIS — G629 Polyneuropathy, unspecified: Secondary | ICD-10-CM | POA: Diagnosis not present

## 2021-10-28 DIAGNOSIS — M2042 Other hammer toe(s) (acquired), left foot: Secondary | ICD-10-CM | POA: Diagnosis not present

## 2021-10-28 DIAGNOSIS — L84 Corns and callosities: Secondary | ICD-10-CM | POA: Diagnosis not present

## 2021-10-28 DIAGNOSIS — B351 Tinea unguium: Secondary | ICD-10-CM | POA: Diagnosis not present

## 2021-10-28 DIAGNOSIS — M79674 Pain in right toe(s): Secondary | ICD-10-CM | POA: Diagnosis not present

## 2021-10-28 DIAGNOSIS — M2041 Other hammer toe(s) (acquired), right foot: Secondary | ICD-10-CM | POA: Diagnosis not present

## 2021-11-03 DIAGNOSIS — B372 Candidiasis of skin and nail: Secondary | ICD-10-CM | POA: Diagnosis not present

## 2021-11-22 ENCOUNTER — Telehealth: Payer: Self-pay

## 2021-11-22 ENCOUNTER — Telehealth: Payer: Self-pay | Admitting: Family Medicine

## 2021-11-22 DIAGNOSIS — E876 Hypokalemia: Secondary | ICD-10-CM

## 2021-11-22 NOTE — Telephone Encounter (Signed)
LVM for patient to callback and schedule. Nothing further needed.      FYI

## 2021-11-22 NOTE — Telephone Encounter (Signed)
Pt is requesting a copy of blood work sent to her cardiologist (Dr. Terrence Dupont)  Pt did not have MD's contact info, but stated MD sent it before, so we should have it .

## 2021-11-22 NOTE — Telephone Encounter (Signed)
Blood work sent to Dr. Prudencio Burly.

## 2021-11-22 NOTE — Telephone Encounter (Signed)
Can you get her scheduled for a lab appointment? Does not need to be fasting.

## 2021-11-22 NOTE — Telephone Encounter (Signed)
-----   Message from Rodrigo Ran, [place] sent at 10/25/2021  3:03 PM EDT ----- Potassium in 4 weeks.

## 2021-11-25 ENCOUNTER — Other Ambulatory Visit (INDEPENDENT_AMBULATORY_CARE_PROVIDER_SITE_OTHER): Payer: Medicare Other

## 2021-11-25 DIAGNOSIS — E876 Hypokalemia: Secondary | ICD-10-CM

## 2021-11-25 LAB — POTASSIUM: Potassium: 3.4 mEq/L — ABNORMAL LOW (ref 3.5–5.1)

## 2021-12-01 ENCOUNTER — Telehealth: Payer: Self-pay | Admitting: Family Medicine

## 2021-12-01 NOTE — Telephone Encounter (Signed)
Left message for patient to call back and schedule Medicare Annual Wellness Visit (AWV) either virtually or in office. Left  my jabber number [phone]   Last AWV  01/05/21 please schedule with Nurse Health Adviser   45 min for awv-i and in office appointments 30 min for awv-s  phone/virtual appointments  

## 2021-12-06 ENCOUNTER — Telehealth: Payer: Self-pay

## 2021-12-06 DIAGNOSIS — E876 Hypokalemia: Secondary | ICD-10-CM

## 2021-12-06 NOTE — Telephone Encounter (Signed)
-----   Message from Rodrigo Ran, [place] sent at 11/29/2021 10:44 AM EST ----- Potassium in 1-2 weeks.

## 2021-12-06 NOTE — Telephone Encounter (Signed)
Patient just needs a lab appt, doesn't have to be fasting.

## 2021-12-07 NOTE — Telephone Encounter (Signed)
LVM for patient to call and schedule lab appointment      [place]

## 2021-12-09 ENCOUNTER — Other Ambulatory Visit (INDEPENDENT_AMBULATORY_CARE_PROVIDER_SITE_OTHER): Payer: Medicare Other

## 2021-12-09 DIAGNOSIS — E876 Hypokalemia: Secondary | ICD-10-CM

## 2021-12-09 LAB — POTASSIUM: Potassium: 3.8 mEq/L (ref 3.5–5.1)

## 2021-12-10 DIAGNOSIS — E785 Hyperlipidemia, unspecified: Secondary | ICD-10-CM | POA: Diagnosis not present

## 2021-12-10 DIAGNOSIS — I10 Essential (primary) hypertension: Secondary | ICD-10-CM | POA: Diagnosis not present

## 2021-12-10 DIAGNOSIS — I2510 Atherosclerotic heart disease of native coronary artery without angina pectoris: Secondary | ICD-10-CM | POA: Diagnosis not present

## 2022-01-19 ENCOUNTER — Ambulatory Visit: Payer: Medicare Other

## 2022-01-19 VITALS — Ht 63.0 in | Wt 174.0 lb

## 2022-01-19 DIAGNOSIS — Z0000 Encounter for general adult medical examination without abnormal findings: Secondary | ICD-10-CM

## 2022-01-19 NOTE — Patient Instructions (Addendum)
Kara Diaz , Thank you for taking time to come for your Medicare Wellness Visit. I appreciate your ongoing commitment to your health goals. Please review the following plan we discussed and let me know if I can assist you in the future.   These are the goals we discussed:  Goals       Patient Stated (pt-stated)      I wants to continue to lose weight.        This is a list of the screening recommended for you and due dates:  Health Maintenance  Topic Date Due   COVID-19 Vaccine (7 - 2023-24 season) 02/04/2022*   Flu Shot  04/10/2022*   Zoster (Shingles) Vaccine (1 of 2) 04/20/2022*   Medicare Annual Wellness Visit  01/20/2023   DTaP/Tdap/Td vaccine (2 - Td or Tdap) 09/06/2023   Pneumonia Vaccine  Completed   DEXA scan (bone density measurement)  Completed   HPV Vaccine  Aged Out  *Topic was postponed. The date shown is not the original due date.    Advanced directives: Advance directive discussed with you today. Even though you declined this today, please call our office should you change your mind, and we can give you the proper paperwork for you to fill out.   Conditions/risks identified: None  Next appointment: Follow up in one year for your annual wellness visit     Preventive Care 65 Years and Older, Female Preventive care refers to lifestyle choices and visits with your health care provider that can promote health and wellness. What does preventive care include? A yearly physical exam. This is also called an annual well check. Dental exams once or twice a year. Routine eye exams. Ask your health care provider how often you should have your eyes checked. Personal lifestyle choices, including: Daily care of your teeth and gums. Regular physical activity. Eating a healthy diet. Avoiding tobacco and drug use. Limiting alcohol use. Practicing safe sex. Taking low-dose aspirin every day. Taking vitamin and mineral supplements as recommended by your health care  provider. What happens during an annual well check? The services and screenings done by your health care provider during your annual well check will depend on your age, overall health, lifestyle risk factors, and family history of disease. Counseling  Your health care provider may ask you questions about your: Alcohol use. Tobacco use. Drug use. Emotional well-being. Home and relationship well-being. Sexual activity. Eating habits. History of falls. Memory and ability to understand (cognition). Work and work Statistician. Reproductive health. Screening  You may have the following tests or measurements: Height, weight, and BMI. Blood pressure. Lipid and cholesterol levels. These may be checked every 5 years, or more frequently if you are over 34 years old. Skin check. Lung cancer screening. You may have this screening every year starting at age 4 if you have a 30-pack-year history of smoking and currently smoke or have quit within the past 15 years. Fecal occult blood test (FOBT) of the stool. You may have this test every year starting at age 32. Flexible sigmoidoscopy or colonoscopy. You may have a sigmoidoscopy every 5 years or a colonoscopy every 10 years starting at age 16. Hepatitis C blood test. Hepatitis B blood test. Sexually transmitted disease (STD) testing. Diabetes screening. This is done by checking your blood sugar (glucose) after you have not eaten for a while (fasting). You may have this done every 1-3 years. Bone density scan. This is done to screen for osteoporosis. You may have this  done starting at age 69. Mammogram. This may be done every 1-2 years. Talk to your health care provider about how often you should have regular mammograms. Talk with your health care provider about your test results, treatment options, and if necessary, the need for more tests. Vaccines  Your health care provider may recommend certain vaccines, such as: Influenza vaccine. This is  recommended every year. Tetanus, diphtheria, and acellular pertussis (Tdap, Td) vaccine. You may need a Td booster every 10 years. Zoster vaccine. You may need this after age 76. Pneumococcal 13-valent conjugate (PCV13) vaccine. One dose is recommended after age 87. Pneumococcal polysaccharide (PPSV23) vaccine. One dose is recommended after age 58. Talk to your health care provider about which screenings and vaccines you need and how often you need them. This information is not intended to replace advice given to you by your health care provider. Make sure you discuss any questions you have with your health care provider. Document Released: 01/23/2015 Document Revised: 09/16/2015 Document Reviewed: 10/28/2014 Elsevier Interactive Patient Education  2017 [place] Prevention in the Home Falls can cause injuries. They can happen to people of all ages. There are many things you can do to make your home safe and to help prevent falls. What can I do on the outside of my home? Regularly fix the edges of walkways and driveways and fix any cracks. Remove anything that might make you trip as you walk through a door, such as a raised step or threshold. Trim any bushes or trees on the path to your home. Use bright outdoor lighting. Clear any walking paths of anything that might make someone trip, such as rocks or tools. Regularly check to see if handrails are loose or broken. Make sure that both sides of any steps have handrails. Any raised decks and porches should have guardrails on the edges. Have any leaves, snow, or ice cleared regularly. Use sand or salt on walking paths during winter. Clean up any spills in your garage right away. This includes oil or grease spills. What can I do in the bathroom? Use night lights. Install grab bars by the toilet and in the tub and shower. Do not use towel bars as grab bars. Use non-skid mats or decals in the tub or shower. If you need to sit down in  the shower, use a plastic, non-slip stool. Keep the floor dry. Clean up any water that spills on the floor as soon as it happens. Remove soap buildup in the tub or shower regularly. Attach bath mats securely with double-sided non-slip rug tape. Do not have throw rugs and other things on the floor that can make you trip. What can I do in the bedroom? Use night lights. Make sure that you have a light by your bed that is easy to reach. Do not use any sheets or blankets that are too big for your bed. They should not hang down onto the floor. Have a firm chair that has side arms. You can use this for support while you get dressed. Do not have throw rugs and other things on the floor that can make you trip. What can I do in the kitchen? Clean up any spills right away. Avoid walking on wet floors. Keep items that you use a lot in easy-to-reach places. If you need to reach something above you, use a strong step stool that has a grab bar. Keep electrical cords out of the way. Do not use floor polish or wax  that makes floors slippery. If you must use wax, use non-skid floor wax. Do not have throw rugs and other things on the floor that can make you trip. What can I do with my stairs? Do not leave any items on the stairs. Make sure that there are handrails on both sides of the stairs and use them. Fix handrails that are broken or loose. Make sure that handrails are as long as the stairways. Check any carpeting to make sure that it is firmly attached to the stairs. Fix any carpet that is loose or worn. Avoid having throw rugs at the top or bottom of the stairs. If you do have throw rugs, attach them to the floor with carpet tape. Make sure that you have a light switch at the top of the stairs and the bottom of the stairs. If you do not have them, ask someone to add them for you. What else can I do to help prevent falls? Wear shoes that: Do not have high heels. Have rubber bottoms. Are comfortable  and fit you well. Are closed at the toe. Do not wear sandals. If you use a stepladder: Make sure that it is fully opened. Do not climb a closed stepladder. Make sure that both sides of the stepladder are locked into place. Ask someone to hold it for you, if possible. Clearly mark and make sure that you can see: Any grab bars or handrails. First and last steps. Where the edge of each step is. Use tools that help you move around (mobility aids) if they are needed. These include: Canes. Walkers. Scooters. Crutches. Turn on the lights when you go into a dark area. Replace any light bulbs as soon as they burn out. Set up your furniture so you have a clear path. Avoid moving your furniture around. If any of your floors are uneven, fix them. If there are any pets around you, be aware of where they are. Review your medicines with your doctor. Some medicines can make you feel dizzy. This can increase your chance of falling. Ask your doctor what other things that you can do to help prevent falls. This information is not intended to replace advice given to you by your health care provider. Make sure you discuss any questions you have with your health care provider. Document Released: 10/23/2008 Document Revised: 06/04/2015 Document Reviewed: 01/31/2014 Elsevier Interactive Patient Education  2017 [employer].

## 2022-01-19 NOTE — Progress Notes (Signed)
Subjective:   Kara Diaz is a 82 y.o. female who presents for Medicare Annual (Subsequent) preventive examination.  Review of Systems    Virtual Visit via Telephone Note  I connected with  Keeli Roberg on 01/19/22 at 10:00 AM EST by telephone and verified that I am speaking with the correct person using two identifiers.  Location: Patient: Home Provider: Office Persons participating in the virtual visit: patient/Nurse Health Advisor   I discussed the limitations, risks, security and privacy concerns of performing an evaluation and management service by telephone and the availability of in person appointments. The patient expressed understanding and agreed to proceed.  Interactive audio and video telecommunications were attempted between this nurse and patient, however failed, due to patient having technical difficulties OR patient did not have access to video capability.  We continued and completed visit with audio only.  Some vital signs may be absent or patient reported.   Criselda Peaches, LPN  Cardiac Risk Factors include: advanced age (>36mn, >>29women);hypertension     Objective:    Today's Vitals   01/19/22 0953  Weight: 174 lb (78.9 kg)  Height: '5\' 3"'$  (1.6 m)   Body mass index is 30.82 kg/m.     01/19/2022   10:03 AM 01/05/2021   11:03 AM 07/19/2019    8:37 AM 10/31/2018    8:52 AM 05/11/2016   12:35 PM 09/11/2015   11:40 AM 02/12/2015   10:51 AM  Advanced Directives  Does Patient Have a Medical Advance Directive? No No No No No No No  Would patient like information on creating a medical advance directive? No - Patient declined No - Patient declined Yes (ED - Information included in AVS) No - Patient declined  No - patient declined information No - patient declined information    Current Medications (verified) Outpatient Encounter Medications as of 01/19/2022  Medication Sig   losartan (COZAAR) 25 MG tablet Take 25 mg by mouth daily.    amLODipine-benazepril (LOTREL) 10-40 MG capsule Take 1 capsule by mouth daily.   aspirin EC 81 MG tablet Take 81 mg by mouth daily.   atorvastatin (LIPITOR) 40 MG tablet Take 1 tablet (40 mg total) by mouth daily.   CALCIUM PO Take by mouth. Take 1 tablet by mouth.   Cholecalciferol (VITAMIN D3) 2000 UNITS TABS Take 2,000 Units by mouth daily.    cycloSPORINE (RESTASIS) 0.05 % ophthalmic emulsion Place 1 drop into both eyes 2 (two) times daily.   KLOR-CON M20 20 MEQ tablet TAKE 1 TABLET DAILY   L-Methylfolate-Algae-B12-B6 (METANX) 3-90.314-2-35 MG CAPS Take 1 capsule by mouth 2 (two) times daily.   MAGNESIUM PO Take by mouth. Take 1 tablet by mouth daily.   metoprolol succinate (TOPROL-XL) 25 MG 24 hr tablet Take 0.5 tablets (12.5 mg total) by mouth daily.   Multiple Vitamin (MULTIVITAMIN) tablet Take 1 tablet by mouth daily.   nystatin (MYCOSTATIN/NYSTOP) powder Apply 1 application topically 3 (three) times daily.   nystatin-triamcinolone (MYCOLOG II) cream Apply 1 application topically 2 (two) times daily as needed.   triamterene-hydrochlorothiazide (MAXZIDE-25) 37.5-25 MG tablet Take 1/2 tablet by mouth daily.   No facility-administered encounter medications on file as of 01/19/2022.    Allergies (verified) Patient has no known allergies.   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   Early  cataracts, bilateral    Hemorrhoids    History of radiation therapy 06/07/11-07/11/11   left breast 50Gy/72f   Hyperlipidemia    doesn't take any meds for this   Hypertension    takes Dyazide and Lotrel daily   Insomnia    but doesn't take anything for this   Joint pain    Joint swelling    Personal history of radiation therapy 2013   Pneumonia    hx of in 1992   PONV (postoperative nausea and vomiting)    Urinary frequency    Past Surgical History:  Procedure  Laterality Date   ABDOMINAL HYSTERECTOMY  1980   partial hysterectomy   BREAST BIOPSY Left 03/25/2011   BREAST LUMPECTOMY Left 05/09/2011   left breast lumpectomy   CARDIAC CATHETERIZATION  12/16/10   CHOLECYSTECTOMY  late 90's   COLONOSCOPY     JOINT REPLACEMENT Right 11/2011   knee replacement   knee replacement surgery     MYOMECTOMY     early 70's   rotator cuff surgery  1998   right   TONSILLECTOMY     as a child   Family History  Problem Relation Age of Onset   Hypertension Mother    Hypertension Father    Diabetes Father    Cancer Cousin    Cancer Cousin    Cancer Brother        prostate   Stroke Brother    Malignant hyperthermia Neg Hx    Hypotension Neg Hx    Pseudochol deficiency Neg Hx    Breast cancer Neg Hx    Social History   Socioeconomic History   Marital status: Single    Spouse name: Not on file   Number of children: Not on file   Years of education: Not on file   Highest education level: Not on file  Occupational History   Not on file  Tobacco Use   Smoking status: Former    Types: Cigarettes    Quit date: 01/10/1990    Years since quitting: 32.0   Smokeless tobacco: Never   Tobacco comments:    quit in 1992  Vaping Use   Vaping Use: Never used  Substance and Sexual Activity   Alcohol use: Yes    Comment: glass a month   Drug use: No   Sexual activity: Not Currently    Birth control/protection: Surgical  Other Topics Concern   Not on file  Social History Narrative   Not on file   Social Determinants of Health   Financial Resource Strain: Low Risk  (01/19/2022)   Overall Financial Resource Strain (CARDIA)    Difficulty of Paying Living Expenses: Not hard at all  Food Insecurity: No Food Insecurity (01/19/2022)   Hunger Vital Sign    Worried About Running Out of Food in the Last Year: Never true    Ran Out of Food in the Last Year: Never true  Transportation Needs: No Transportation Needs (01/19/2022)   PRAPARE - TArmed forces logistics/support/administrative officer(Medical): No    Lack of Transportation (Non-Medical): No  Physical Activity: Inactive (01/19/2022)   Exercise Vital Sign    Days of Exercise per Week: 0 days    Minutes of Exercise per Session: 0 min  Stress: No Stress Concern Present (01/19/2022)   [place]   Feeling of Stress : Not at all  Social Connections: Moderately Integrated (01/19/2022)   Social Connection  and Isolation Panel [NHANES]    Frequency of Communication with Friends and Family: More than three times a week    Frequency of Social Gatherings with Friends and Family: More than three times a week    Attends Religious Services: More than 4 times per year    Active Member of [employer] or Organizations: Yes    Attends Music therapist: More than 4 times per year    Marital Status: Never married    Tobacco Counseling Counseling given: Not Answered Tobacco comments: quit in 1992   Clinical Intake:  Pre-visit preparation completed: No  Pain : No/denies pain     BMI - recorded: 30.82 Nutritional Status: BMI > 30  Obese Nutritional Risks: None Diabetes: No  How often do you need to have someone help you when you read instructions, pamphlets, or other written materials from your doctor or pharmacy?: 1 - Never  Diabetic?  No  Interpreter Needed?: No  Information entered by :: Rolene Arbour LPN   Activities of Daily Living    01/19/2022   10:01 AM  In your present state of health, do you have any difficulty performing the following activities:  Hearing? 0  Vision? 0  Difficulty concentrating or making decisions? 0  Walking or climbing stairs? 0  Dressing or bathing? 0  Doing errands, shopping? 0  Preparing Food and eating ? N  Using the Toilet? N  In the past six months, have you accidently leaked urine? N  Do you have problems with loss of bowel control? N  Managing your Medications? N  Managing your  Finances? N  Housekeeping or managing your Housekeeping? N    Patient Care Team: [place], Betty G, MD as PCP - General (Family Medicine)  Indicate any recent Medical Services you may have received from other than [facility] providers in the past year (date may be approximate).     Assessment:   This is a routine wellness examination for [place].  Hearing/Vision screen Hearing Screening - Comments:: Denies hearing difficulties   Vision Screening - Comments:: Wears rx glasses - up to date with routine eye exams with  [place] issues and exercise activities discussed: Exercise limited by: None identified   Goals Addressed               This Visit's Progress     Patient Stated (pt-stated)        I wants to continue to lose weight.       Depression Screen    01/19/2022   10:00 AM 10/22/2021    3:07 PM 03/17/2021   10:35 AM 01/05/2021   11:07 AM 10/14/2020    7:28 PM 02/21/2020   10:47 AM 03/14/2016    1:32 PM  PHQ 2/9 Scores  PHQ - 2 Score 0 0 0 0 0 0 1    Fall Risk    01/19/2022   10:01 AM 10/22/2021    3:07 PM 03/17/2021   10:35 AM 01/05/2021   11:05 AM 10/14/2020    7:28 PM  [place] in the past year? 0 0 0 0 0  Number falls in past yr: 0 0 0  0  Injury with Fall? 0 0 0  0  Risk for fall due to : No Fall Risks   Impaired balance/gait;Impaired mobility;Medication side effect Orthopedic patient;Impaired balance/gait  Follow up Falls prevention discussed Education provided  Falls evaluation completed;Education provided;Falls prevention discussed Education provided  FALL RISK PREVENTION PERTAINING TO THE HOME:  Any stairs in or around the home? Yes  If so, are there any without handrails? No  Home free of loose throw rugs in walkways, pet beds, electrical cords, etc? Yes  Adequate lighting in your home to reduce risk of falls? Yes   ASSISTIVE DEVICES UTILIZED TO PREVENT FALLS:  Life alert? Yes Use of a cane, walker or w/c? Yes  Grab  bars in the bathroom? No  Shower chair or bench in shower? No  Elevated toilet seat or a handicapped toilet? No   TIMED UP AND GO:  Was the test performed? No . Audio Visit  Cognitive Function:        01/19/2022   10:03 AM 01/05/2021   11:12 AM  6CIT Screen  What Year? 0 points 0 points  What month? 0 points 0 points  What time? 0 points 0 points  Count back from 20 0 points 0 points  Months in reverse 0 points 0 points  Repeat phrase 0 points 0 points  Total Score 0 points 0 points    Immunizations Immunization History  Administered Date(s) Administered   Fluad Quad(high Dose 65+) 10/16/2020   Influenza Whole 10/05/2011   Influenza, High Dose Seasonal PF 11/17/2016   Influenza,inj,Quad PF,6+ Mos 11/23/2012, 10/02/2013   PFIZER(Purple Top)SARS-COV-2 Vaccination 02/16/2019, 03/09/2019, 05/15/2019, 10/23/2019, 05/14/2020   Pfizer Covid-19 Vaccine Bivalent Booster 12yr & up 10/16/2020   Pneumococcal Conjugate-13 09/25/2010, 02/12/2015   Pneumococcal Polysaccharide-23 11/23/2012   Tdap 09/05/2013    TDAP status: Up to date  Flu Vaccine status: Up to date  Pneumococcal vaccine status: Up to date  Covid-19 vaccine status: Completed vaccines  Qualifies for Shingles Vaccine? Yes   Zostavax completed No   Shingrix Completed?: No.    Education has been provided regarding the importance of this vaccine. Patient has been advised to call insurance company to determine out of pocket expense if they have not yet received this vaccine. Advised may also receive vaccine at local pharmacy or Health Dept. Verbalized acceptance and understanding.  Screening Tests Health Maintenance  Topic Date Due   COVID-19 Vaccine (7 - 2023-24 season) 02/04/2022 (Originally 09/10/2021)   INFLUENZA VACCINE  04/10/2022 (Originally 08/10/2021)   Zoster Vaccines- Shingrix (1 of 2) 04/20/2022 (Originally 04/22/1959)   Medicare Annual Wellness (AWV)  01/20/2023   DTaP/Tdap/Td (2 - Td or Tdap) 09/06/2023    Pneumonia Vaccine 82 Years old  Completed   DEXA SCAN  Completed   HPV VACCINES  Aged Out    Health Maintenance  There are no preventive care reminders to display for this patient.   Colorectal cancer screening: No longer required.   Mammogram status: No longer required due to Age.  Bone Density status: Completed 06/12/20. Results reflect: Bone density results: OSTEOPOROSIS. Repeat every   years.  Lung Cancer Screening: (Low Dose CT Chest recommended if Age 81-80years, 30 pack-year currently smoking OR have quit w/in 15years.) does not qualify.     Additional Screening:  Hepatitis C Screening: does not qualify; Completed   Vision Screening: Recommended annual ophthalmology exams for early detection of glaucoma and other disorders of the eye. Is the patient up to date with their annual eye exam?  Yes  Who is the provider or what is the name of the office in which the patient attends annual eye exams[place] is not established with a provider, would they like to be referred to a provider to establish  care? No .   Dental Screening: Recommended annual dental exams for proper oral hygiene  Community Resource Referral / Chronic Care Management:  CRR required this visit?  No   CCM required this visit?  No      Plan:     I have personally reviewed and noted the following in the patient's chart:   Medical and social history Use of alcohol, tobacco or illicit drugs  Current medications and supplements including opioid prescriptions. Patient is not currently taking opioid prescriptions. Functional ability and status Nutritional status Physical activity Advanced directives List of other physicians Hospitalizations, surgeries, and ER visits in previous 12 months Vitals Screenings to include cognitive, depression, and falls Referrals and appointments  In addition, I have reviewed and discussed with patient certain preventive protocols, quality  metrics, and best practice recommendations. A written personalized care plan for preventive services as well as general preventive health recommendations were provided to patient.     Criselda Peaches, LPN   02/22/2480   Nurse Notes: None

## 2022-02-03 DIAGNOSIS — I70203 Unspecified atherosclerosis of native arteries of extremities, bilateral legs: Secondary | ICD-10-CM | POA: Diagnosis not present

## 2022-02-03 DIAGNOSIS — M2042 Other hammer toe(s) (acquired), left foot: Secondary | ICD-10-CM | POA: Diagnosis not present

## 2022-02-03 DIAGNOSIS — M79674 Pain in right toe(s): Secondary | ICD-10-CM | POA: Diagnosis not present

## 2022-02-03 DIAGNOSIS — M2041 Other hammer toe(s) (acquired), right foot: Secondary | ICD-10-CM | POA: Diagnosis not present

## 2022-02-03 DIAGNOSIS — L84 Corns and callosities: Secondary | ICD-10-CM | POA: Diagnosis not present

## 2022-02-03 DIAGNOSIS — B351 Tinea unguium: Secondary | ICD-10-CM | POA: Diagnosis not present

## 2022-02-03 DIAGNOSIS — M79675 Pain in left toe(s): Secondary | ICD-10-CM | POA: Diagnosis not present

## 2022-02-03 DIAGNOSIS — G629 Polyneuropathy, unspecified: Secondary | ICD-10-CM | POA: Diagnosis not present

## 2022-03-11 DIAGNOSIS — E785 Hyperlipidemia, unspecified: Secondary | ICD-10-CM | POA: Diagnosis not present

## 2022-03-11 DIAGNOSIS — I2510 Atherosclerotic heart disease of native coronary artery without angina pectoris: Secondary | ICD-10-CM | POA: Diagnosis not present

## 2022-03-11 DIAGNOSIS — I10 Essential (primary) hypertension: Secondary | ICD-10-CM | POA: Diagnosis not present

## 2022-04-05 ENCOUNTER — Other Ambulatory Visit: Payer: Self-pay | Admitting: Family Medicine

## 2022-04-05 DIAGNOSIS — Z0000 Encounter for general adult medical examination without abnormal findings: Secondary | ICD-10-CM

## 2022-04-27 NOTE — Progress Notes (Unsigned)
HPI: Kara Diaz is a 82 y.o. female, who is here today for chronic disease management.  Last seen on 10/22/21  She reports no new problems since her last visit in October. She saw her podiatrist in January for corns and calluses and had her Medicare wellness visit. She also saw her cardiologist last month She has been monitoring her blood pressure at home, which has been in the 120s/70s range.  She denies experiencing chest pain, difficulty breathing, orthopnea,PND,or palpitations. LE edema has been stable. She has not used compression stockings for swelling.   Hypokalemia :She is on KLOR 20 meq daily.  Vit D def: She is on Vit D 2000 U daily.  HTN since age 11. She is on Triamterene-HCTZ 37.5-25 mg daily and Amlodipine-Benazepril 10-40 mg daily.  Lab Results  Component Value Date   CREATININE 0.72 10/22/2021   BUN 16 10/22/2021   NA 140 10/22/2021   K 3.8 12/09/2021   CL 100 10/22/2021   CO2 32 10/22/2021   Hypercalcemia that has been stable for years. She has not noted muscle spasms,abdominal pain,changes in bowel habits,mood or MS changes. Lab Results  Component Value Date   PTH 75 03/17/2021   CALCIUM 11.5 (H) 10/22/2021   CAION 5.81 (H) 02/12/2020   PHOS 2.8 03/27/2020   Lab Results  Component Value Date   TSH 0.73 06/26/2019    She reports not feeling well when taking Metoprolol, so she stopped it. Her heart rate is in the 70s when checked at home. Negative for unusual or severe headache, visual changes, exertional chest pain, dyspnea,  focal weakness, or edema.  Aortic atherosclerosis and HLD on Atorvastatin 40 mg daily. She is also on Aspirin 81 mg daily.  Lab Results  Component Value Date   CHOL 191 03/17/2021   HDL 89.60 03/17/2021   LDLCALC 88 03/17/2021   TRIG 65.0 03/17/2021   CHOLHDL 2 03/17/2021   Review of Systems  Constitutional:  Negative for chills and fever.  HENT:  Negative for sore throat and trouble swallowing.    Respiratory:  Negative for cough and wheezing.   Gastrointestinal:  Negative for nausea and vomiting.  Endocrine: Negative for cold intolerance and heat intolerance.  Skin:  Negative for rash.  Neurological:  Negative for syncope and facial asymmetry.  Psychiatric/Behavioral:  Negative for confusion and hallucinations.   See other pertinent positives and negatives in HPI.  Current Outpatient Medications on File Prior to Visit  Medication Sig Dispense Refill   amLODipine-benazepril (LOTREL) 10-40 MG capsule Take 1 capsule by mouth daily. 90 capsule 3   aspirin EC 81 MG tablet Take 81 mg by mouth daily.     atorvastatin (LIPITOR) 40 MG tablet Take 1 tablet (40 mg total) by mouth daily. 90 tablet 3   CALCIUM PO Take by mouth. Take 1 tablet by mouth.     Cholecalciferol (VITAMIN D3) 2000 UNITS TABS Take 2,000 Units by mouth daily.      cycloSPORINE (RESTASIS) 0.05 % ophthalmic emulsion Place 1 drop into both eyes 2 (two) times daily.     KLOR-CON M20 20 MEQ tablet TAKE 1 TABLET DAILY 90 tablet 3   L-Methylfolate-Algae-B12-B6 (METANX) 3-90.314-2-35 MG CAPS Take 1 capsule by mouth 2 (two) times daily.     MAGNESIUM PO Take by mouth. Take 1 tablet by mouth daily.     Multiple Vitamin (MULTIVITAMIN) tablet Take 1 tablet by mouth daily.     nystatin (MYCOSTATIN/NYSTOP) powder Apply 1 application topically 3 (three)  times daily. 15 g 2   nystatin-triamcinolone (MYCOLOG II) cream Apply 1 application topically 2 (two) times daily as needed. 45 g 1   No current facility-administered medications on file prior to visit.    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   Early cataracts, bilateral    Hemorrhoids    History of radiation therapy 06/07/11-07/11/11   left breast 50Gy/39fx   Hyperlipidemia    doesn't take any meds for this   Hypertension    takes Dyazide and Lotrel daily   Insomnia     but doesn't take anything for this   Joint pain    Joint swelling    Personal history of radiation therapy 2013   Pneumonia    hx of in 1992   PONV (postoperative nausea and vomiting)    Urinary frequency    No Known Allergies  Social History   Socioeconomic History   Marital status: Single    Spouse name: Not on file   Number of children: Not on file   Years of education: Not on file   Highest education level: Not on file  Occupational History   Not on file  Tobacco Use   Smoking status: Former    Types: Cigarettes    Quit date: 01/10/1990    Years since quitting: 32.3   Smokeless tobacco: Never   Tobacco comments:    quit in 1992  Vaping Use   Vaping Use: Never used  Substance and Sexual Activity   Alcohol use: Yes    Comment: glass a month   Drug use: No   Sexual activity: Not Currently    Birth control/protection: Surgical  Other Topics Concern   Not on file  Social History Narrative   Not on file   Social Determinants of Health   Financial Resource Strain: Low Risk  (01/19/2022)   Overall Financial Resource Strain (CARDIA)    Difficulty of Paying Living Expenses: Not hard at all  Food Insecurity: No Food Insecurity (01/19/2022)   Hunger Vital Sign    Worried About Running Out of Food in the Last Year: Never true    Ran Out of Food in the Last Year: Never true  Transportation Needs: No Transportation Needs (01/19/2022)   PRAPARE - Administrator, Civil Service (Medical): No    Lack of Transportation (Non-Medical): No  Physical Activity: Inactive (01/19/2022)   Exercise Vital Sign    Days of Exercise per Week: 0 days    Minutes of Exercise per Session: 0 min  Stress: No Stress Concern Present (01/19/2022)   [employer] of Occupational Health - Occupational Stress Questionnaire    Feeling of Stress : Not at all  Social Connections: Moderately Integrated (01/19/2022)   Social Connection and Isolation Panel [NHANES]    Frequency of  Communication with Friends and Family: More than three times a week    Frequency of Social Gatherings with Friends and Family: More than three times a week    Attends Religious Services: More than 4 times per year    Active Member of [employer] or Organizations: Yes    Attends Banker Meetings: More than 4 times per year    Marital Status: Never married    Vitals:   04/29/22 1029  BP: 138/76  Pulse: (!) 110  Resp: 16  SpO2:  99%   Body mass index is 31.75 kg/m.  Physical Exam Vitals and nursing note reviewed.  Constitutional:      General: She is not in acute distress.    Appearance: She is well-developed.  HENT:     Head: Normocephalic and atraumatic.     Mouth/Throat:     Mouth: Mucous membranes are moist.     Pharynx: Oropharynx is clear.  Eyes:     Conjunctiva/sclera: Conjunctivae normal.  Cardiovascular:     Rate and Rhythm: Regular rhythm. Tachycardia present.     Pulses:          Dorsalis pedis pulses are 2+ on the right side and 2+ on the left side.     Heart sounds: Murmur (SEM I-II/VI LUSB and RUSB) heard.  Pulmonary:     Effort: Pulmonary effort is normal. No respiratory distress.     Breath sounds: Normal breath sounds.  Abdominal:     Palpations: Abdomen is soft. There is no hepatomegaly or mass.     Tenderness: There is no abdominal tenderness.  Musculoskeletal:     Right lower leg: No edema.     Left lower leg: No edema.  Lymphadenopathy:     Cervical: No cervical adenopathy.  Skin:    General: Skin is warm.     Findings: No erythema or rash.  Neurological:     General: No focal deficit present.     Mental Status: She is alert and oriented to person, place, and time.     Cranial Nerves: No cranial nerve deficit.     Comments: Gait assisted by a cane.  Psychiatric:        Mood and Affect: Mood and affect normal.   ASSESSMENT AND PLAN:  Ms. Hudson was seen today for medical management of chronic issues.  Diagnoses and all orders for  this visit:  Hypokalemia Assessment & Plan: Today HCTZ discontinued, so she was instructed to hold on KLOR until we re-check K+. Labs in 4 weeks.  Orders: -     Basic metabolic panel; Future -     Magnesium; Future  Resistant hypertension Assessment & Plan: BP has been adequately controlled. Because hypoK+ and hypercalcemia , she agrees with stopping HCTZ. Spironolactone 25 mg daily added tdoay. Continue amlodipine-benazepril 10-40 mg daily. Continue monitoring BP regularly. Continue low-salt diet. She is going to see cardiologist in 06/2022.  Orders: -     Spironolactone; Take 1 tablet (25 mg total) by mouth daily.  Dispense: 90 tablet; Refill: 0 -     Basic metabolic panel; Future -     Hepatic function panel; Future -     TSH; Future  Aortic atherosclerosis Assessment & Plan: Seen on CXR in 02/2020. On Atorvastatin 40 mg daily. Last LDL 88 in 03/2021. He is not fasting today, will arrange appt for fasting labs in a month.  Orders: -     Lipid panel; Future  Vitamin D deficiency, unspecified Assessment & Plan: Continue current dose of Vit D supplementation. 25 OH vit D with next labs.  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 Ca++ has been stable and iPTH has been in normal range. Will follow with next blood work.  Orders: -     VITAMIN D 25 Hydroxy (Vit-D Deficiency, Fractures); Future -     Calcium, ionized; Future -     Parathyroid hormone, intact (no Ca); Future -  Phosphorus; Future   Return in about 6 months (around 10/29/2022).  Willard Madrigal G. [place], MD  [facility]. [place] office.

## 2022-04-29 ENCOUNTER — Ambulatory Visit: Payer: Medicare Other | Admitting: Family Medicine

## 2022-04-29 ENCOUNTER — Encounter: Payer: Self-pay | Admitting: Family Medicine

## 2022-04-29 VITALS — BP 138/76 | HR 110 | Resp 16 | Ht 63.0 in | Wt 179.2 lb

## 2022-04-29 DIAGNOSIS — I1A0 Resistant hypertension: Secondary | ICD-10-CM

## 2022-04-29 DIAGNOSIS — E876 Hypokalemia: Secondary | ICD-10-CM

## 2022-04-29 DIAGNOSIS — I700 Atherosclerosis of aorta: Secondary | ICD-10-CM | POA: Diagnosis not present

## 2022-04-29 DIAGNOSIS — E559 Vitamin D deficiency, unspecified: Secondary | ICD-10-CM

## 2022-04-29 MED ORDER — SPIRONOLACTONE 25 MG PO TABS: 25.0000 mg | ORAL_TABLET | Freq: Every day | ORAL | 0 refills | Status: DC

## 2022-04-29 NOTE — Patient Instructions (Signed)
A few things to remember from today's visit:  Hypokalemia  Resistant hypertension - Plan: spironolactone (ALDACTONE) 25 MG tablet Today we stopped triamterene-hydrochlorothiazide , this changes may help your potassium and calcium. Hold on potassium for now. Spironolactone, which is also a fluid pill and similar to triamterene started today. No changes in the rest. Continue monitoring blood pressure. We need to re-check potassium in a month.  If you need refills for medications you take chronically, please call your pharmacy. Do not use My Chart to request refills or for acute issues that need immediate attention. If you send a my chart message, it may take a few days to be addressed, specially if I am not in the office.  Please be sure medication list is accurate. If a new problem present, please set up appointment sooner than planned today.

## 2022-04-30 NOTE — Assessment & Plan Note (Signed)
Continue current dose of Vit D supplementation. 25 OH vit D with next labs.

## 2022-04-30 NOTE — Assessment & Plan Note (Signed)
Seen on CXR in 02/2020. On Atorvastatin 40 mg daily. Last LDL 88 in 03/2021. He is not fasting today, will arrange appt for fasting labs in a month.

## 2022-04-30 NOTE — Assessment & Plan Note (Signed)
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 Ca++ has been stable and iPTH has been in normal range. Will follow with next blood work.

## 2022-04-30 NOTE — Assessment & Plan Note (Signed)
Today HCTZ discontinued, so she was instructed to hold on KLOR until we re-check K+. Labs in 4 weeks.

## 2022-04-30 NOTE — Assessment & Plan Note (Signed)
BP has been adequately controlled. Because hypoK+ and hypercalcemia , she agrees with stopping HCTZ. Spironolactone 25 mg daily added tdoay. Continue amlodipine-benazepril 10-40 mg daily. Continue monitoring BP regularly. Continue low-salt diet. She is going to see cardiologist in 06/2022.

## 2022-05-05 DIAGNOSIS — M2041 Other hammer toe(s) (acquired), right foot: Secondary | ICD-10-CM | POA: Diagnosis not present

## 2022-05-05 DIAGNOSIS — G629 Polyneuropathy, unspecified: Secondary | ICD-10-CM | POA: Diagnosis not present

## 2022-05-05 DIAGNOSIS — M2042 Other hammer toe(s) (acquired), left foot: Secondary | ICD-10-CM | POA: Diagnosis not present

## 2022-05-20 ENCOUNTER — Ambulatory Visit
Admission: RE | Admit: 2022-05-20 | Discharge: 2022-05-20 | Disposition: A | Discharge status: Home / Self Care | Payer: Medicare Other | Source: Ambulatory Visit | Attending: Family Medicine | Admitting: Family Medicine

## 2022-05-20 DIAGNOSIS — Z1231 Encounter for screening mammogram for malignant neoplasm of breast: Secondary | ICD-10-CM | POA: Diagnosis not present

## 2022-05-20 DIAGNOSIS — Z0000 Encounter for general adult medical examination without abnormal findings: Secondary | ICD-10-CM

## 2022-06-08 ENCOUNTER — Other Ambulatory Visit (INDEPENDENT_AMBULATORY_CARE_PROVIDER_SITE_OTHER): Payer: Medicare Other

## 2022-06-08 DIAGNOSIS — I1A0 Resistant hypertension: Secondary | ICD-10-CM | POA: Diagnosis not present

## 2022-06-08 DIAGNOSIS — E876 Hypokalemia: Secondary | ICD-10-CM | POA: Diagnosis not present

## 2022-06-08 DIAGNOSIS — E559 Vitamin D deficiency, unspecified: Secondary | ICD-10-CM

## 2022-06-08 DIAGNOSIS — I700 Atherosclerosis of aorta: Secondary | ICD-10-CM | POA: Diagnosis not present

## 2022-06-08 LAB — LIPID PANEL
Cholesterol: 170 mg/dL (ref 0–200)
HDL: 77.2 mg/dL (ref >39.00)
LDL Cholesterol: 83 mg/dL (ref 0–99)
NonHDL: 93.19
Total CHOL/HDL Ratio: 2
Triglycerides: 50 mg/dL (ref 0.0–149.0)
VLDL: 10 mg/dL (ref 0.0–40.0)

## 2022-06-08 LAB — PHOSPHORUS: Phosphorus: 2.7 mg/dL (ref 2.3–4.6)

## 2022-06-08 LAB — BASIC METABOLIC PANEL
BUN: 11 mg/dL (ref 6–23)
CO2: 28 mEq/L (ref 19–32)
Calcium: 11.1 mg/dL — ABNORMAL HIGH (ref 8.4–10.5)
Chloride: 103 mEq/L (ref 96–112)
Creatinine, Ser: 0.78 mg/dL (ref 0.40–1.20)
GFR: 71.38 mL/min (ref >60.00)
Glucose, Bld: 79 mg/dL (ref 70–99)
Potassium: 3.5 mEq/L (ref 3.5–5.1)
Sodium: 141 mEq/L (ref 135–145)

## 2022-06-08 LAB — HEPATIC FUNCTION PANEL
ALT: 14 U/L (ref 0–35)
AST: 22 U/L (ref 0–37)
Albumin: 4.6 g/dL (ref 3.5–5.2)
Alkaline Phosphatase: 95 U/L (ref 39–117)
Bilirubin, Direct: 0.3 mg/dL (ref 0.0–0.3)
Total Bilirubin: 1.7 mg/dL — ABNORMAL HIGH (ref 0.2–1.2)
Total Protein: 8.2 g/dL (ref 6.0–8.3)

## 2022-06-08 LAB — MAGNESIUM: Magnesium: 2 mg/dL (ref 1.5–2.5)

## 2022-06-08 LAB — VITAMIN D 25 HYDROXY (VIT D DEFICIENCY, FRACTURES): VITD: 70.54 ng/mL (ref 30.00–100.00)

## 2022-06-08 LAB — TSH: TSH: 0.77 u[IU]/mL (ref 0.35–5.50)

## 2022-06-09 LAB — CALCIUM, IONIZED: Calcium, Ion: 5.7 mg/dL — ABNORMAL HIGH (ref 4.7–5.5)

## 2022-06-09 LAB — PARATHYROID HORMONE, INTACT (NO CA): PTH: 76 pg/mL (ref 16–77)

## 2022-06-17 DIAGNOSIS — I10 Essential (primary) hypertension: Secondary | ICD-10-CM | POA: Diagnosis not present

## 2022-06-17 DIAGNOSIS — I2510 Atherosclerotic heart disease of native coronary artery without angina pectoris: Secondary | ICD-10-CM | POA: Diagnosis not present

## 2022-06-17 DIAGNOSIS — E782 Mixed hyperlipidemia: Secondary | ICD-10-CM | POA: Diagnosis not present

## 2022-06-24 ENCOUNTER — Telehealth: Payer: Self-pay | Admitting: Family Medicine

## 2022-06-24 NOTE — Telephone Encounter (Signed)
Would like to discuss results from labs done the end of may

## 2022-06-24 NOTE — Telephone Encounter (Signed)
See result note.  

## 2022-07-11 ENCOUNTER — Other Ambulatory Visit: Payer: Self-pay | Admitting: Family Medicine

## 2022-07-11 DIAGNOSIS — I1A0 Resistant hypertension: Secondary | ICD-10-CM

## 2022-08-04 DIAGNOSIS — I739 Peripheral vascular disease, unspecified: Secondary | ICD-10-CM | POA: Diagnosis not present

## 2022-08-04 DIAGNOSIS — G629 Polyneuropathy, unspecified: Secondary | ICD-10-CM | POA: Diagnosis not present

## 2022-08-04 DIAGNOSIS — M2042 Other hammer toe(s) (acquired), left foot: Secondary | ICD-10-CM | POA: Diagnosis not present

## 2022-08-04 DIAGNOSIS — M2041 Other hammer toe(s) (acquired), right foot: Secondary | ICD-10-CM | POA: Diagnosis not present

## 2022-08-09 NOTE — Progress Notes (Unsigned)
ACUTE VISIT No chief complaint on file.  HPI: [name] is a 82 y.o. female, who is here today complaining of *** HPI  Review of Systems See other pertinent positives and negatives in HPI.  Current Outpatient Medications on File Prior to Visit  Medication Sig Dispense Refill   amLODipine-benazepril (LOTREL) 10-40 MG capsule Take 1 capsule by mouth daily. 90 capsule 3   aspirin EC 81 MG tablet Take 81 mg by mouth daily.     atorvastatin (LIPITOR) 40 MG tablet Take 1 tablet (40 mg total) by mouth daily. 90 tablet 3   CALCIUM PO Take by mouth. Take 1 tablet by mouth.     Cholecalciferol (VITAMIN D3) 2000 UNITS TABS Take 2,000 Units by mouth daily.      cycloSPORINE (RESTASIS) 0.05 % ophthalmic emulsion Place 1 drop into both eyes 2 (two) times daily.     KLOR-CON M20 20 MEQ tablet TAKE 1 TABLET DAILY 90 tablet 3   L-Methylfolate-Algae-B12-B6 (METANX) 3-90.314-2-35 MG CAPS Take 1 capsule by mouth 2 (two) times daily.     MAGNESIUM PO Take by mouth. Take 1 tablet by mouth daily.     Multiple Vitamin (MULTIVITAMIN) tablet Take 1 tablet by mouth daily.     nystatin (MYCOSTATIN/NYSTOP) powder Apply 1 application topically 3 (three) times daily. 15 g 2   nystatin-triamcinolone (MYCOLOG II) cream Apply 1 application topically 2 (two) times daily as needed. 45 g 1   spironolactone (ALDACTONE) 25 MG tablet TAKE 1 TABLET DAILY (TRIAMTERENE-HCTZ DISCONTINUED) 90 tablet 3   No current facility-administered medications on file prior to visit.    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   Early cataracts, bilateral    Hemorrhoids    History of radiation therapy 06/07/11-07/11/11   left breast 50Gy/60fx   Hyperlipidemia    doesn't take any meds for this   Hypertension    takes Dyazide and Lotrel daily   Insomnia    but doesn't take anything for this   Joint pain     Joint swelling    Personal history of radiation therapy 2013   Pneumonia    hx of in 1992   PONV (postoperative nausea and vomiting)    Urinary frequency    No Known Allergies  Social History   Socioeconomic History   Marital status: Single    Spouse name: Not on file   Number of children: Not on file   Years of education: Not on file   Highest education level: Not on file  Occupational History   Not on file  Tobacco Use   Smoking status: Former    Current packs/day: 0.00    Types: Cigarettes    Quit date: 01/10/1990    Years since quitting: 32.6   Smokeless tobacco: Never   Tobacco comments:    quit in 1992  Vaping Use   Vaping status: Never Used  Substance and Sexual Activity   Alcohol use: Yes    Comment: glass a month   Drug use: No   Sexual activity: Not Currently    Birth control/protection: Surgical  Other Topics Concern   Not on file  Social History Narrative   Not on file   Social Determinants of Health   Financial Resource Strain: Low Risk  (01/19/2022)   Overall Financial Resource Strain (CARDIA)  Difficulty of Paying Living Expenses: Not hard at all  Food Insecurity: No Food Insecurity (01/19/2022)   Hunger Vital Sign    Worried About Running Out of Food in the Last Year: Never true    Ran Out of Food in the Last Year: Never true  Transportation Needs: No Transportation Needs (01/19/2022)   PRAPARE - Administrator, Civil Service (Medical): No    Lack of Transportation (Non-Medical): No  Physical Activity: Inactive (01/19/2022)   Exercise Vital Sign    Days of Exercise per Week: 0 days    Minutes of Exercise per Session: 0 min  Stress: No Stress Concern Present (01/19/2022)   [employer] of Occupational Health - Occupational Stress Questionnaire    Feeling of Stress : Not at all  Social Connections: Moderately Integrated (01/19/2022)   Social Connection and Isolation Panel [NHANES]    Frequency of Communication with Friends and  Family: More than three times a week    Frequency of Social Gatherings with Friends and Family: More than three times a week    Attends Religious Services: More than 4 times per year    Active Member of [employer] or Organizations: Yes    Attends Engineer, structural: More than 4 times per year    Marital Status: Never married    There were no vitals filed for this visit. There is no height or weight on file to calculate BMI.  Physical Exam  ASSESSMENT AND PLAN: There are no diagnoses linked to this encounter.  No follow-ups on file.  Zamarah Ullmer G. [place], MD  [facility]. [place] office.  Discharge Instructions   None

## 2022-08-10 ENCOUNTER — Ambulatory Visit (INDEPENDENT_AMBULATORY_CARE_PROVIDER_SITE_OTHER): Payer: Medicare Other | Admitting: Family Medicine

## 2022-08-10 ENCOUNTER — Encounter: Payer: Self-pay | Admitting: Family Medicine

## 2022-08-10 VITALS — BP 136/70 | HR 73 | Temp 98.5°F | Resp 16 | Ht 63.0 in | Wt 174.1 lb

## 2022-08-10 DIAGNOSIS — E876 Hypokalemia: Secondary | ICD-10-CM | POA: Diagnosis not present

## 2022-08-10 DIAGNOSIS — I1A0 Resistant hypertension: Secondary | ICD-10-CM | POA: Diagnosis not present

## 2022-08-10 DIAGNOSIS — L03115 Cellulitis of right lower limb: Secondary | ICD-10-CM

## 2022-08-10 DIAGNOSIS — M79661 Pain in right lower leg: Secondary | ICD-10-CM

## 2022-08-10 MED ORDER — TRIAMTERENE-HCTZ 37.5-25 MG PO TABS
1.0000 | ORAL_TABLET | Freq: Every day | ORAL | 2 refills | Status: DC
Start: 2022-08-10 — End: 2023-08-14

## 2022-08-10 MED ORDER — DOXYCYCLINE HYCLATE 100 MG PO TABS
100.0000 mg | ORAL_TABLET | Freq: Two times a day (BID) | ORAL | 0 refills | Status: AC
Start: 2022-08-10 — End: 2022-08-17

## 2022-08-10 NOTE — Assessment & Plan Note (Signed)
We discussed some side effects of hydrochlorothiazide, which was discontinued because persistent hypokalemia.Upset because med was d/c, she would like to resume triamterene-hydrochlorothiazide 37.5-25 mg. So stop Spironolactone. Resume KLOR 20 me daily. BMP in 2-3 weeks.

## 2022-08-10 NOTE — Patient Instructions (Signed)
A few things to remember from today's visit:  Resistant hypertension - Plan: triamterene-hydrochlorothiazide (MAXZIDE-25) 37.5-25 MG tablet  Right calf pain - Plan: VAS [place] LOWER EXTREMITY VENOUS (DVT), CANCELED: VAS [place] LOWER EXTREMITY VENOUS (DVT)  Doxycycline 2 times daily and leg elevation. Resume potassium supplementation and triamterene-hydrochlorothiazide. Labs in 2-3 weeks.  If you need refills for medications you take chronically, please call your pharmacy. Do not use My Chart to request refills or for acute issues that need immediate attention. If you send a my chart message, it may take a few days to be addressed, specially if I am not in the office.  Please be sure medication list is accurate. If a new problem present, please set up appointment sooner than planned today.

## 2022-08-10 NOTE — Assessment & Plan Note (Signed)
BP otherwise adequately controlled. She would like to resume Triamterene-hydrochlorothiazide 37.5-25 mg daily. Stop Spironolactone 25 mg. Continue amlodipine-benazepril 10-40 mg daily. Continue monitoring BP regularly as well as low salt diet.Marland Kitchen

## 2022-08-11 ENCOUNTER — Ambulatory Visit (HOSPITAL_COMMUNITY)
Admission: RE | Admit: 2022-08-11 | Discharge: 2022-08-11 | Disposition: A | Discharge status: Home / Self Care | Payer: Medicare Other | Source: Ambulatory Visit | Attending: Family Medicine | Admitting: Family Medicine

## 2022-08-11 ENCOUNTER — Telehealth: Payer: Self-pay | Admitting: *Deleted

## 2022-08-11 DIAGNOSIS — M79661 Pain in right lower leg: Secondary | ICD-10-CM | POA: Diagnosis not present

## 2022-08-11 NOTE — Progress Notes (Signed)
VASCULAR LAB    Right lower extremity venous duplex has been performed.  See CV proc for preliminary results.  Called report  Makaley Storts, RVT 08/11/2022, 4:17 PM

## 2022-08-11 NOTE — Telephone Encounter (Signed)
Ultrasound - No DVT.  The patient does have a reactive lymph node that is constant with cellulitis.

## 2022-08-12 NOTE — Telephone Encounter (Signed)
I left patient a voicemail with the information below & advised her to call back with any questions. 

## 2022-08-12 NOTE — Telephone Encounter (Signed)
Please convey this information to pt. ?Thanks, ?BJ ?

## 2022-08-19 ENCOUNTER — Telehealth: Payer: Self-pay | Admitting: Family Medicine

## 2022-08-19 NOTE — Telephone Encounter (Signed)
PT is calling and would like a refill on doxycycline (VIBRA-TABS) 100 MG tablet  for cellulitis and pt decline to make another appt. Pt will be at work number by 12 noon.  [facility] [address] [place], [place] - [address] Phone: [phone]  Fax: [phone]    Pt was seen on 08-10-2022

## 2022-08-23 MED ORDER — DOXYCYCLINE HYCLATE 100 MG PO TABS: 100.0000 mg | ORAL_TABLET | Freq: Two times a day (BID) | ORAL | 0 refills | Status: AC

## 2022-08-23 NOTE — Telephone Encounter (Signed)
Rx sent in

## 2022-08-23 NOTE — Telephone Encounter (Signed)
Call in Doxycycline 100 mg BID for 10 days  

## 2022-08-23 NOTE — Telephone Encounter (Signed)
Can you advise in pcp's absence? 

## 2022-08-30 NOTE — Progress Notes (Unsigned)
HPI:  [name] is a 82 y.o. female, who is here today to follow on recent visit.  Review of Systems See other pertinent positives and negatives in HPI.  Current Outpatient Medications on File Prior to Visit  Medication Sig Dispense Refill   amLODipine-benazepril (LOTREL) 10-40 MG capsule Take 1 capsule by mouth daily. 90 capsule 3   aspirin EC 81 MG tablet Take 81 mg by mouth daily.     atorvastatin (LIPITOR) 40 MG tablet Take 1 tablet (40 mg total) by mouth daily. 90 tablet 3   CALCIUM PO Take by mouth. Take 1 tablet by mouth.     Cholecalciferol (VITAMIN D3) 2000 UNITS TABS Take 2,000 Units by mouth daily.      cycloSPORINE (RESTASIS) 0.05 % ophthalmic emulsion Place 1 drop into both eyes 2 (two) times daily.     doxycycline (VIBRA-TABS) 100 MG tablet Take 1 tablet (100 mg total) by mouth 2 (two) times daily for 10 days. 20 tablet 0   KLOR-CON M20 20 MEQ tablet TAKE 1 TABLET DAILY 90 tablet 3   L-Methylfolate-Algae-B12-B6 (METANX) 3-90.314-2-35 MG CAPS Take 1 capsule by mouth 2 (two) times daily.     MAGNESIUM PO Take by mouth. Take 1 tablet by mouth daily.     Multiple Vitamin (MULTIVITAMIN) tablet Take 1 tablet by mouth daily.     nystatin (MYCOSTATIN/NYSTOP) powder Apply 1 application topically 3 (three) times daily. 15 g 2   nystatin-triamcinolone (MYCOLOG II) cream Apply 1 application topically 2 (two) times daily as needed. 45 g 1   triamterene-hydrochlorothiazide (MAXZIDE-25) 37.5-25 MG tablet Take 1 tablet by mouth daily. 90 tablet 2   No current facility-administered medications on file prior to visit.    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   Early cataracts, bilateral    Hemorrhoids    History of radiation therapy 06/07/11-07/11/11   left breast 50Gy/39fx   Hyperlipidemia    doesn't take any meds for this   Hypertension    takes  Dyazide and Lotrel daily   Insomnia    but doesn't take anything for this   Joint pain    Joint swelling    Personal history of radiation therapy 2013   Pneumonia    hx of in 1992   PONV (postoperative nausea and vomiting)    Urinary frequency    No Known Allergies  Social History   Socioeconomic History   Marital status: Single    Spouse name: Not on file   Number of children: Not on file   Years of education: Not on file   Highest education level: Not on file  Occupational History   Not on file  Tobacco Use   Smoking status: Former    Current packs/day: 0.00    Types: Cigarettes    Quit date: 01/10/1990    Years since quitting: 32.6   Smokeless tobacco: Never   Tobacco comments:    quit in 1992  Vaping Use   Vaping status: Never Used  Substance and Sexual Activity   Alcohol use: Yes    Comment: glass a month   Drug use: No   Sexual activity: Not Currently    Birth control/protection: Surgical  Other Topics Concern   Not on file  Social History Narrative   Not on file   Social Determinants  of Health   Financial Resource Strain: Low Risk  (01/19/2022)   Overall Financial Resource Strain (CARDIA)    Difficulty of Paying Living Expenses: Not hard at all  Food Insecurity: No Food Insecurity (01/19/2022)   Hunger Vital Sign    Worried About Running Out of Food in the Last Year: Never true    Ran Out of Food in the Last Year: Never true  Transportation Needs: No Transportation Needs (01/19/2022)   PRAPARE - Administrator, Civil Service (Medical): No    Lack of Transportation (Non-Medical): No  Physical Activity: Inactive (01/19/2022)   Exercise Vital Sign    Days of Exercise per Week: 0 days    Minutes of Exercise per Session: 0 min  Stress: No Stress Concern Present (01/19/2022)   [employer] of Occupational Health - Occupational Stress Questionnaire    Feeling of Stress : Not at all  Social Connections: Moderately Integrated (01/19/2022)    Social Connection and Isolation Panel [NHANES]    Frequency of Communication with Friends and Family: More than three times a week    Frequency of Social Gatherings with Friends and Family: More than three times a week    Attends Religious Services: More than 4 times per year    Active Member of [employer] or Organizations: Yes    Attends Engineer, structural: More than 4 times per year    Marital Status: Never married    There were no vitals filed for this visit. There is no height or weight on file to calculate BMI.  Physical Exam  ASSESSMENT AND PLAN:  There are no diagnoses linked to this encounter.  No orders of the defined types were placed in this encounter.   No problem-specific Assessment & Plan notes found for this encounter.   No follow-ups on file.  Shia Delaine G. [place], MD  [facility]. [place] [facility].

## 2022-08-31 ENCOUNTER — Encounter: Payer: Self-pay | Admitting: Family Medicine

## 2022-08-31 ENCOUNTER — Ambulatory Visit: Payer: Medicare Other | Admitting: Family Medicine

## 2022-08-31 VITALS — BP 138/72 | HR 70 | Temp 97.7°F | Resp 16 | Ht 63.6 in | Wt 166.4 lb

## 2022-08-31 DIAGNOSIS — K5900 Constipation, unspecified: Secondary | ICD-10-CM | POA: Diagnosis not present

## 2022-08-31 DIAGNOSIS — M79661 Pain in right lower leg: Secondary | ICD-10-CM

## 2022-08-31 DIAGNOSIS — R590 Localized enlarged lymph nodes: Secondary | ICD-10-CM | POA: Diagnosis not present

## 2022-08-31 DIAGNOSIS — R599 Enlarged lymph nodes, unspecified: Secondary | ICD-10-CM

## 2022-08-31 NOTE — Patient Instructions (Addendum)
A few things to remember from today's visit:  Inguinal adenopathy  Right calf pain  Enlarged lymph nodes - Plan: CT ABDOMEN PELVIS W CONTRAST  No changes today. Complete antibiotic.  Miralax and Senakot at night daily until a good bowel movement then every other day as needed.  If you need refills for medications you take chronically, please call your pharmacy. Do not use My Chart to request refills or for acute issues that need immediate attention. If you send a my chart message, it may take a few days to be addressed, specially if I am not in the office.  Please be sure medication list is accurate. If a new problem present, please set up appointment sooner than planned today.

## 2022-09-18 ENCOUNTER — Ambulatory Visit (HOSPITAL_BASED_OUTPATIENT_CLINIC_OR_DEPARTMENT_OTHER)
Admission: RE | Admit: 2022-09-18 | Discharge: 2022-09-18 | Disposition: A | Discharge status: Home / Self Care | Payer: Medicare Other | Source: Ambulatory Visit | Attending: Family Medicine | Admitting: Family Medicine

## 2022-09-18 ENCOUNTER — Encounter (HOSPITAL_BASED_OUTPATIENT_CLINIC_OR_DEPARTMENT_OTHER): Payer: Self-pay

## 2022-09-18 DIAGNOSIS — R590 Localized enlarged lymph nodes: Secondary | ICD-10-CM | POA: Diagnosis not present

## 2022-09-18 DIAGNOSIS — R599 Enlarged lymph nodes, unspecified: Secondary | ICD-10-CM | POA: Insufficient documentation

## 2022-09-18 MED ORDER — IOHEXOL 300 MG/ML  SOLN
100.0000 mL | Freq: Once | INTRAMUSCULAR | Status: AC | PRN
  Administered 2022-09-18: 100 mL via INTRAVENOUS

## 2022-09-22 DIAGNOSIS — Z23 Encounter for immunization: Secondary | ICD-10-CM | POA: Diagnosis not present

## 2022-09-30 DIAGNOSIS — R634 Abnormal weight loss: Secondary | ICD-10-CM | POA: Diagnosis not present

## 2022-09-30 DIAGNOSIS — E782 Mixed hyperlipidemia: Secondary | ICD-10-CM | POA: Diagnosis not present

## 2022-09-30 DIAGNOSIS — I10 Essential (primary) hypertension: Secondary | ICD-10-CM | POA: Diagnosis not present

## 2022-09-30 DIAGNOSIS — I2510 Atherosclerotic heart disease of native coronary artery without angina pectoris: Secondary | ICD-10-CM | POA: Diagnosis not present

## 2022-10-05 DIAGNOSIS — Z23 Encounter for immunization: Secondary | ICD-10-CM | POA: Diagnosis not present

## 2022-10-28 DIAGNOSIS — G629 Polyneuropathy, unspecified: Secondary | ICD-10-CM | POA: Diagnosis not present

## 2022-10-28 DIAGNOSIS — M2042 Other hammer toe(s) (acquired), left foot: Secondary | ICD-10-CM | POA: Diagnosis not present

## 2022-10-28 DIAGNOSIS — I879 Disorder of vein, unspecified: Secondary | ICD-10-CM | POA: Diagnosis not present

## 2022-10-28 DIAGNOSIS — I739 Peripheral vascular disease, unspecified: Secondary | ICD-10-CM | POA: Diagnosis not present

## 2022-10-28 DIAGNOSIS — M2041 Other hammer toe(s) (acquired), right foot: Secondary | ICD-10-CM | POA: Diagnosis not present

## 2022-11-04 ENCOUNTER — Ambulatory Visit: Payer: PRIVATE HEALTH INSURANCE | Admitting: Family Medicine

## 2022-11-08 DIAGNOSIS — L03115 Cellulitis of right lower limb: Secondary | ICD-10-CM | POA: Diagnosis not present

## 2022-11-08 DIAGNOSIS — L03116 Cellulitis of left lower limb: Secondary | ICD-10-CM | POA: Diagnosis not present

## 2022-11-08 DIAGNOSIS — M7989 Other specified soft tissue disorders: Secondary | ICD-10-CM | POA: Diagnosis not present

## 2022-12-16 DIAGNOSIS — E876 Hypokalemia: Secondary | ICD-10-CM | POA: Diagnosis not present

## 2022-12-16 DIAGNOSIS — I1A0 Resistant hypertension: Secondary | ICD-10-CM | POA: Diagnosis not present

## 2022-12-16 DIAGNOSIS — E785 Hyperlipidemia, unspecified: Secondary | ICD-10-CM | POA: Diagnosis not present

## 2022-12-16 DIAGNOSIS — K5909 Other constipation: Secondary | ICD-10-CM | POA: Diagnosis not present

## 2022-12-27 DIAGNOSIS — H353111 Nonexudative age-related macular degeneration, right eye, early dry stage: Secondary | ICD-10-CM | POA: Diagnosis not present

## 2022-12-27 DIAGNOSIS — H25812 Combined forms of age-related cataract, left eye: Secondary | ICD-10-CM | POA: Diagnosis not present

## 2022-12-27 DIAGNOSIS — H524 Presbyopia: Secondary | ICD-10-CM | POA: Diagnosis not present

## 2022-12-27 DIAGNOSIS — H25813 Combined forms of age-related cataract, bilateral: Secondary | ICD-10-CM | POA: Diagnosis not present

## 2022-12-27 DIAGNOSIS — H52213 Irregular astigmatism, bilateral: Secondary | ICD-10-CM | POA: Diagnosis not present

## 2022-12-27 DIAGNOSIS — H04123 Dry eye syndrome of bilateral lacrimal glands: Secondary | ICD-10-CM | POA: Diagnosis not present

## 2022-12-27 DIAGNOSIS — H40013 Open angle with borderline findings, low risk, bilateral: Secondary | ICD-10-CM | POA: Diagnosis not present

## 2022-12-30 DIAGNOSIS — I2510 Atherosclerotic heart disease of native coronary artery without angina pectoris: Secondary | ICD-10-CM | POA: Diagnosis not present

## 2022-12-30 DIAGNOSIS — I10 Essential (primary) hypertension: Secondary | ICD-10-CM | POA: Diagnosis not present

## 2022-12-30 DIAGNOSIS — E782 Mixed hyperlipidemia: Secondary | ICD-10-CM | POA: Diagnosis not present

## 2023-01-27 ENCOUNTER — Telehealth: Payer: Self-pay

## 2023-01-27 NOTE — Progress Notes (Signed)
 This encounter was created in error - please disregard.

## 2023-01-27 NOTE — Telephone Encounter (Signed)
Contacted patient on preferred number listed in notes for scheduled AWV. Patient stated she previously canceled visit, will call to reschedule

## 2023-02-08 DIAGNOSIS — I739 Peripheral vascular disease, unspecified: Secondary | ICD-10-CM | POA: Diagnosis not present

## 2023-02-08 DIAGNOSIS — M2042 Other hammer toe(s) (acquired), left foot: Secondary | ICD-10-CM | POA: Diagnosis not present

## 2023-02-08 DIAGNOSIS — I879 Disorder of vein, unspecified: Secondary | ICD-10-CM | POA: Diagnosis not present

## 2023-02-08 DIAGNOSIS — M2041 Other hammer toe(s) (acquired), right foot: Secondary | ICD-10-CM | POA: Diagnosis not present

## 2023-02-08 DIAGNOSIS — G629 Polyneuropathy, unspecified: Secondary | ICD-10-CM | POA: Diagnosis not present

## 2023-03-15 ENCOUNTER — Telehealth: Payer: Self-pay | Admitting: Family Medicine

## 2023-03-15 NOTE — Telephone Encounter (Signed)
 Spoke with patient to schedule AWV  Patient stated she changed PCP to provider in [place]

## 2023-03-31 DIAGNOSIS — I2510 Atherosclerotic heart disease of native coronary artery without angina pectoris: Secondary | ICD-10-CM | POA: Diagnosis not present

## 2023-03-31 DIAGNOSIS — E782 Mixed hyperlipidemia: Secondary | ICD-10-CM | POA: Diagnosis not present

## 2023-03-31 DIAGNOSIS — I10 Essential (primary) hypertension: Secondary | ICD-10-CM | POA: Diagnosis not present

## 2023-04-05 DIAGNOSIS — K5909 Other constipation: Secondary | ICD-10-CM | POA: Diagnosis not present

## 2023-04-05 DIAGNOSIS — I1A0 Resistant hypertension: Secondary | ICD-10-CM | POA: Diagnosis not present

## 2023-04-05 DIAGNOSIS — E785 Hyperlipidemia, unspecified: Secondary | ICD-10-CM | POA: Diagnosis not present

## 2023-04-07 DIAGNOSIS — E785 Hyperlipidemia, unspecified: Secondary | ICD-10-CM | POA: Diagnosis not present

## 2023-04-07 DIAGNOSIS — I10 Essential (primary) hypertension: Secondary | ICD-10-CM | POA: Diagnosis not present

## 2023-04-19 DIAGNOSIS — E785 Hyperlipidemia, unspecified: Secondary | ICD-10-CM | POA: Diagnosis not present

## 2023-04-21 ENCOUNTER — Other Ambulatory Visit: Payer: Self-pay | Admitting: Cardiology

## 2023-04-21 DIAGNOSIS — Z1231 Encounter for screening mammogram for malignant neoplasm of breast: Secondary | ICD-10-CM

## 2023-05-03 DIAGNOSIS — H2512 Age-related nuclear cataract, left eye: Secondary | ICD-10-CM | POA: Diagnosis not present

## 2023-05-03 DIAGNOSIS — H25813 Combined forms of age-related cataract, bilateral: Secondary | ICD-10-CM | POA: Diagnosis not present

## 2023-05-17 DIAGNOSIS — I1A0 Resistant hypertension: Secondary | ICD-10-CM | POA: Diagnosis not present

## 2023-05-17 DIAGNOSIS — L603 Nail dystrophy: Secondary | ICD-10-CM | POA: Diagnosis not present

## 2023-05-24 ENCOUNTER — Ambulatory Visit
Admission: RE | Admit: 2023-05-24 | Discharge: 2023-05-24 | Disposition: A | Discharge status: Home / Self Care | Payer: PRIVATE HEALTH INSURANCE | Source: Ambulatory Visit | Attending: Cardiology | Admitting: Cardiology

## 2023-05-24 DIAGNOSIS — Z1231 Encounter for screening mammogram for malignant neoplasm of breast: Secondary | ICD-10-CM

## 2023-05-30 DIAGNOSIS — H25811 Combined forms of age-related cataract, right eye: Secondary | ICD-10-CM | POA: Diagnosis not present

## 2023-06-07 DIAGNOSIS — H25811 Combined forms of age-related cataract, right eye: Secondary | ICD-10-CM | POA: Diagnosis not present

## 2023-06-07 DIAGNOSIS — H2511 Age-related nuclear cataract, right eye: Secondary | ICD-10-CM | POA: Diagnosis not present

## 2023-07-06 DIAGNOSIS — I739 Peripheral vascular disease, unspecified: Secondary | ICD-10-CM | POA: Diagnosis not present

## 2023-07-06 DIAGNOSIS — G629 Polyneuropathy, unspecified: Secondary | ICD-10-CM | POA: Diagnosis not present

## 2023-07-06 DIAGNOSIS — M2041 Other hammer toe(s) (acquired), right foot: Secondary | ICD-10-CM | POA: Diagnosis not present

## 2023-07-06 DIAGNOSIS — M2042 Other hammer toe(s) (acquired), left foot: Secondary | ICD-10-CM | POA: Diagnosis not present

## 2023-07-06 DIAGNOSIS — I879 Disorder of vein, unspecified: Secondary | ICD-10-CM | POA: Diagnosis not present

## 2023-08-04 DIAGNOSIS — M150 Primary generalized (osteo)arthritis: Secondary | ICD-10-CM | POA: Diagnosis not present

## 2023-08-04 DIAGNOSIS — I10 Essential (primary) hypertension: Secondary | ICD-10-CM | POA: Diagnosis not present

## 2023-08-04 DIAGNOSIS — I2510 Atherosclerotic heart disease of native coronary artery without angina pectoris: Secondary | ICD-10-CM | POA: Diagnosis not present

## 2023-08-04 DIAGNOSIS — E782 Mixed hyperlipidemia: Secondary | ICD-10-CM | POA: Diagnosis not present

## 2023-08-14 ENCOUNTER — Other Ambulatory Visit: Payer: Self-pay | Admitting: Family Medicine

## 2023-08-14 DIAGNOSIS — I1A0 Resistant hypertension: Secondary | ICD-10-CM

## 2023-08-16 DIAGNOSIS — I739 Peripheral vascular disease, unspecified: Secondary | ICD-10-CM | POA: Diagnosis not present

## 2023-08-16 DIAGNOSIS — I70203 Unspecified atherosclerosis of native arteries of extremities, bilateral legs: Secondary | ICD-10-CM | POA: Diagnosis not present

## 2023-09-20 DIAGNOSIS — Z0000 Encounter for general adult medical examination without abnormal findings: Secondary | ICD-10-CM | POA: Diagnosis not present

## 2023-10-05 DIAGNOSIS — I739 Peripheral vascular disease, unspecified: Secondary | ICD-10-CM | POA: Diagnosis not present

## 2023-10-05 DIAGNOSIS — I70203 Unspecified atherosclerosis of native arteries of extremities, bilateral legs: Secondary | ICD-10-CM | POA: Diagnosis not present

## 2023-10-05 DIAGNOSIS — I879 Disorder of vein, unspecified: Secondary | ICD-10-CM | POA: Diagnosis not present

## 2023-10-05 DIAGNOSIS — M2041 Other hammer toe(s) (acquired), right foot: Secondary | ICD-10-CM | POA: Diagnosis not present

## 2023-10-05 DIAGNOSIS — M2042 Other hammer toe(s) (acquired), left foot: Secondary | ICD-10-CM | POA: Diagnosis not present

## 2023-10-05 DIAGNOSIS — G629 Polyneuropathy, unspecified: Secondary | ICD-10-CM | POA: Diagnosis not present

## 2023-10-17 DIAGNOSIS — H2000 Unspecified acute and subacute iridocyclitis: Secondary | ICD-10-CM | POA: Diagnosis not present

## 2023-10-17 DIAGNOSIS — Z23 Encounter for immunization: Secondary | ICD-10-CM | POA: Diagnosis not present

## 2023-10-17 DIAGNOSIS — H40041 Steroid responder, right eye: Secondary | ICD-10-CM | POA: Diagnosis not present

## 2023-10-17 DIAGNOSIS — Z961 Presence of intraocular lens: Secondary | ICD-10-CM | POA: Diagnosis not present

## 2023-10-26 DIAGNOSIS — Z961 Presence of intraocular lens: Secondary | ICD-10-CM | POA: Diagnosis not present

## 2023-10-26 DIAGNOSIS — H40053 Ocular hypertension, bilateral: Secondary | ICD-10-CM | POA: Diagnosis not present

## 2023-11-03 DIAGNOSIS — M150 Primary generalized (osteo)arthritis: Secondary | ICD-10-CM | POA: Diagnosis not present

## 2023-11-03 DIAGNOSIS — I2510 Atherosclerotic heart disease of native coronary artery without angina pectoris: Secondary | ICD-10-CM | POA: Diagnosis not present

## 2023-11-03 DIAGNOSIS — E782 Mixed hyperlipidemia: Secondary | ICD-10-CM | POA: Diagnosis not present

## 2023-11-03 DIAGNOSIS — I10 Essential (primary) hypertension: Secondary | ICD-10-CM | POA: Diagnosis not present

## 2023-11-22 DIAGNOSIS — I10 Essential (primary) hypertension: Secondary | ICD-10-CM | POA: Diagnosis not present

## 2023-11-22 DIAGNOSIS — E785 Hyperlipidemia, unspecified: Secondary | ICD-10-CM | POA: Diagnosis not present

## 2024-02-09 ENCOUNTER — Other Ambulatory Visit: Payer: Self-pay | Admitting: Family Medicine

## 2024-02-09 DIAGNOSIS — I1A0 Resistant hypertension: Secondary | ICD-10-CM
